# Patient Record
Sex: Male | Born: 1985
Health system: Southern US, Community
[De-identification: ages and names within clinical notes are randomized; demographics above are authoritative.]

## PROBLEM LIST (undated history)

## (undated) DIAGNOSIS — M79602 Pain in left arm: Secondary | ICD-10-CM

## (undated) DIAGNOSIS — R609 Edema, unspecified: Secondary | ICD-10-CM

## (undated) DIAGNOSIS — I10 Essential (primary) hypertension: Secondary | ICD-10-CM

## (undated) DIAGNOSIS — M79601 Pain in right arm: Secondary | ICD-10-CM

## (undated) DIAGNOSIS — M545 Low back pain, unspecified: Secondary | ICD-10-CM

## (undated) DIAGNOSIS — R202 Paresthesia of skin: Secondary | ICD-10-CM

## (undated) HISTORY — DX: Essential (primary) hypertension: I10

## (undated) HISTORY — PX: ROTATOR CUFF REPAIR: SHX139

## (undated) HISTORY — PX: FINGER SURGERY: SHX640

## (undated) HISTORY — DX: Edema, unspecified: R60.9

## (undated) HISTORY — DX: Paresthesia of skin: R20.2

## (undated) HISTORY — DX: Pain in left arm: M79.601

## (undated) HISTORY — DX: Pain in right arm: M79.601

## (undated) HISTORY — DX: Low back pain, unspecified: M54.50

## (undated) HISTORY — DX: Pain in left arm: M79.602

---
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
## 2005-05-22 ENCOUNTER — Emergency Department (HOSPITAL_COMMUNITY): Admission: EM | Admit: 2005-05-22 | Discharge: 2005-05-22 | Payer: Self-pay | Admitting: Emergency Medicine

## 2006-08-25 ENCOUNTER — Emergency Department (HOSPITAL_COMMUNITY): Admission: EM | Admit: 2006-08-25 | Discharge: 2006-08-25 | Payer: Self-pay | Admitting: Emergency Medicine

## 2006-08-25 IMAGING — CR DG SHOULDER 2+V*L*
3 series · 3 of 3 positions shown · non-contrast
Comparison: none

CLINICAL DATA: Dislocated shoulder after trauma; history of dislocations. 
 LEFT SHOULDER ? 3 VIEW:

[view not recorded (1 of 3)]
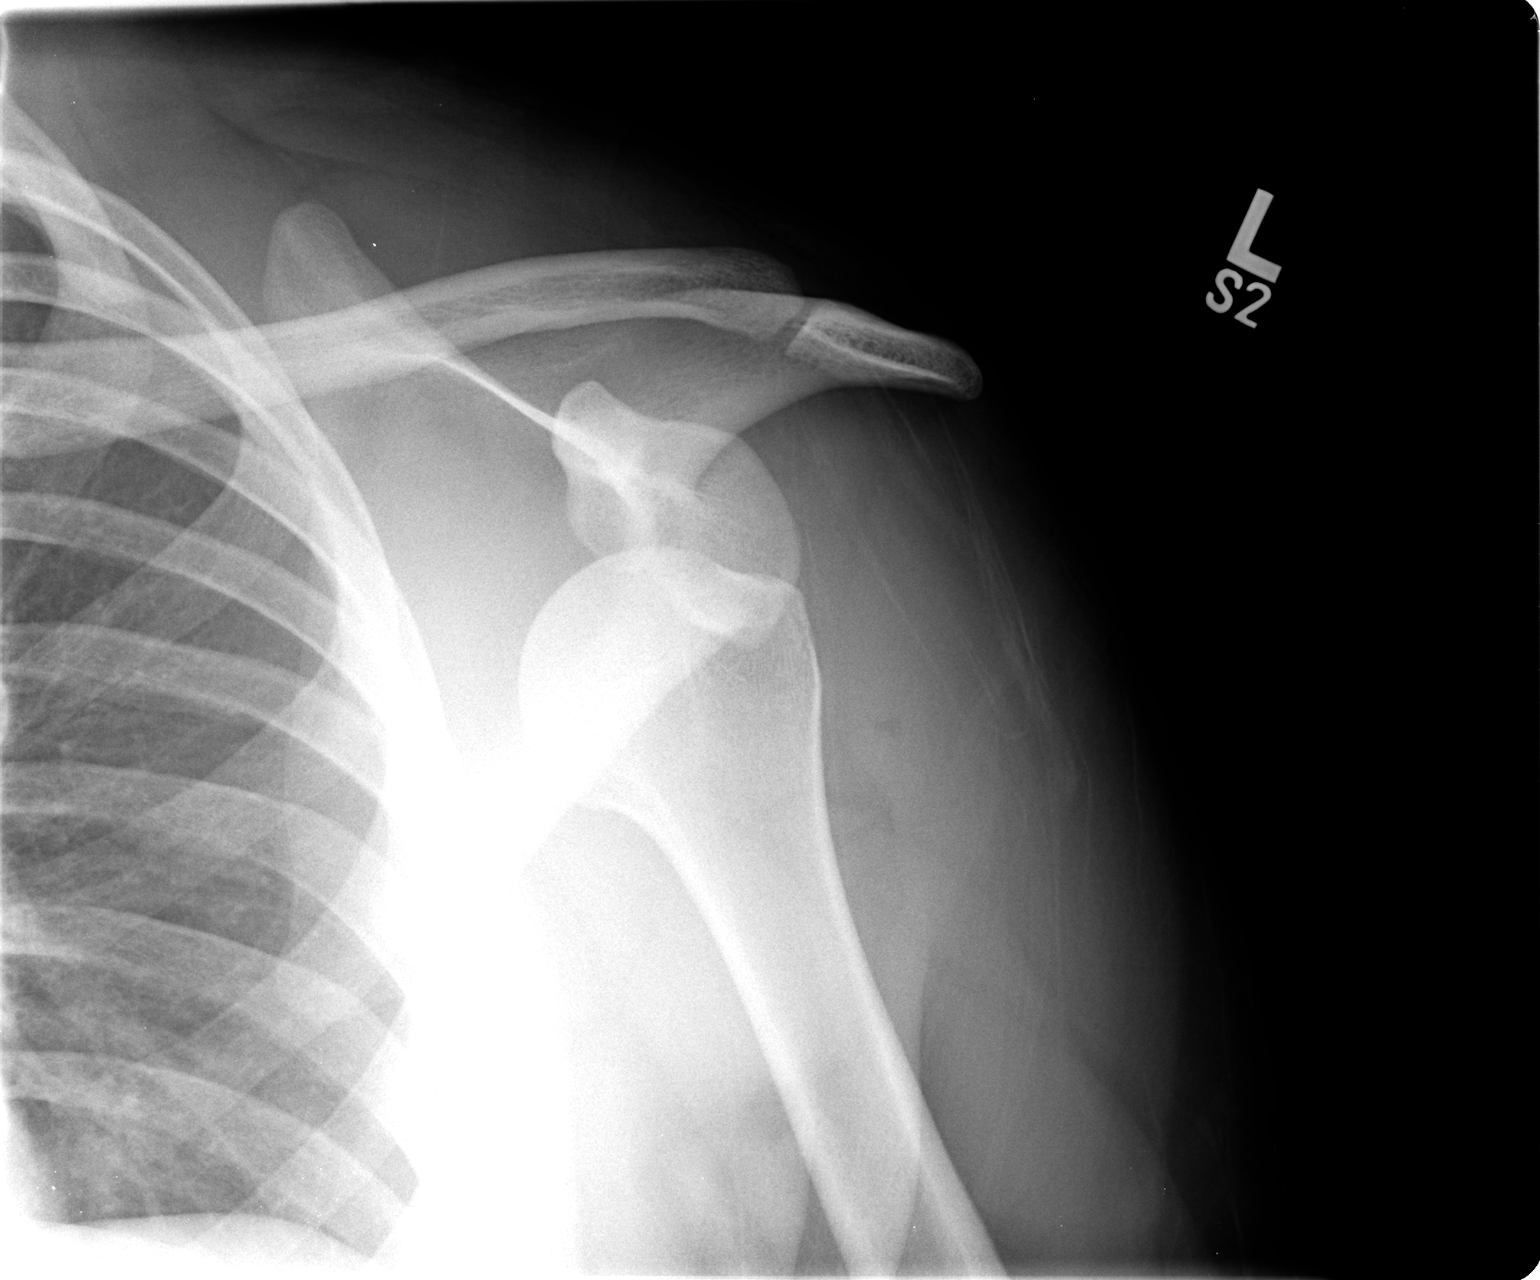

[view not recorded (2 of 3)]
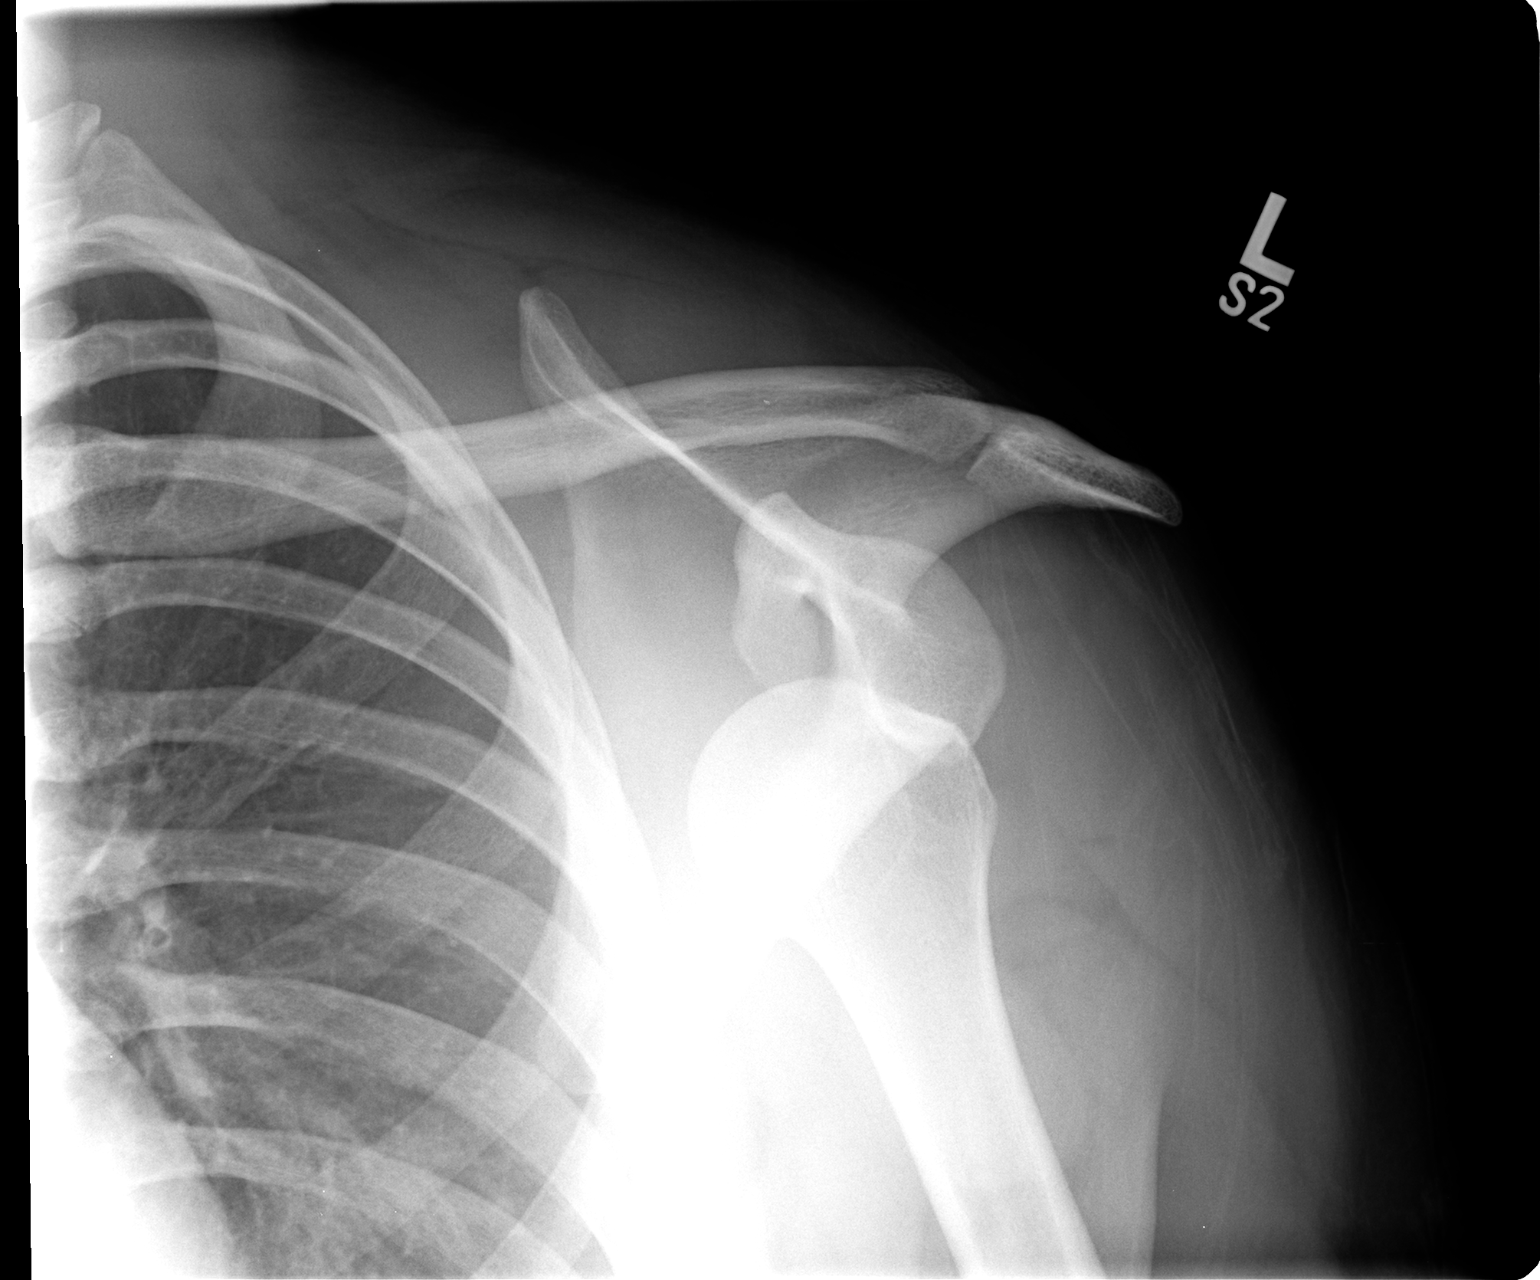

[view not recorded (3 of 3)]
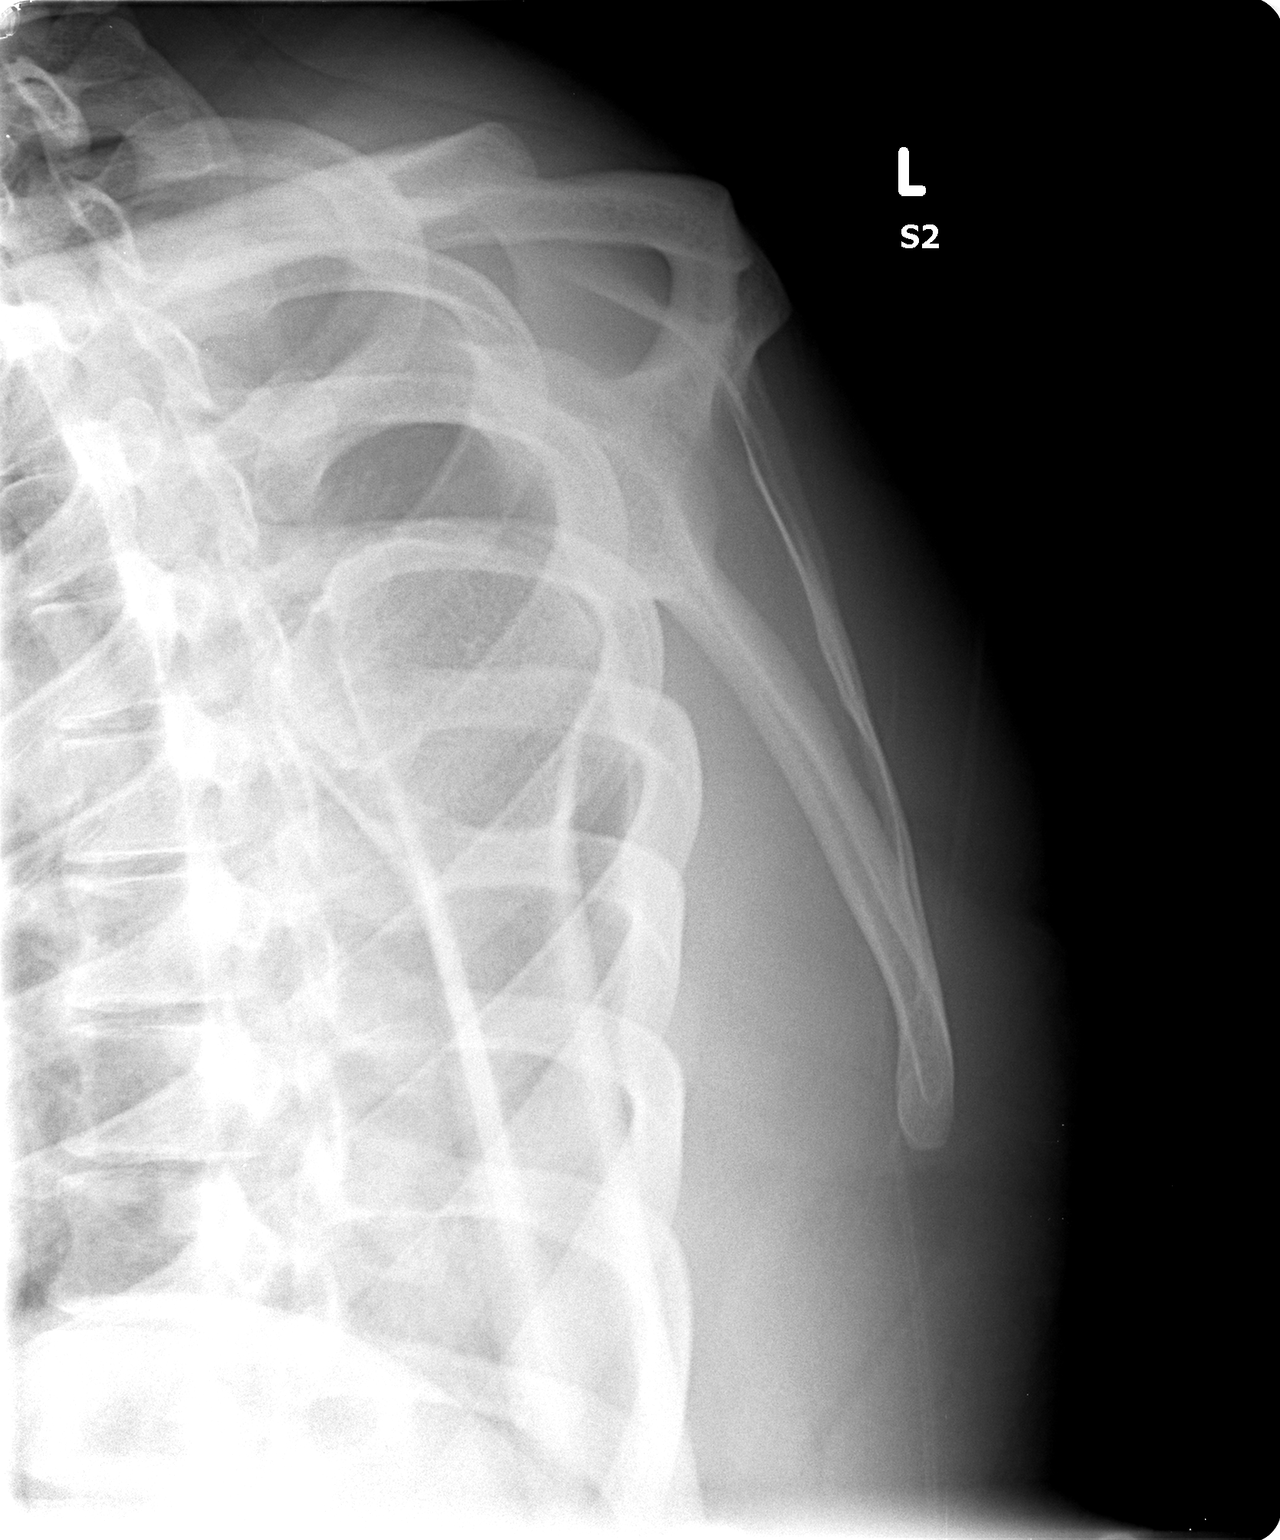

[3 of 3 positions shown; findings below may reference images not displayed]

FINDINGS: There is anterior dislocation of the left humeral head relative to the glenoid.  No fracture is seen.
IMPRESSION: Anterior left shoulder dislocation.

## 2006-08-25 IMAGING — CR DG SHOULDER 2+V*L*
3 series · 3 of 3 positions shown · non-contrast
Comparison: none

CLINICAL DATA: Status post reduction of dislocation.
 LEFT SHOULDER ? 3 VIEW:

[view not recorded (1 of 3)]
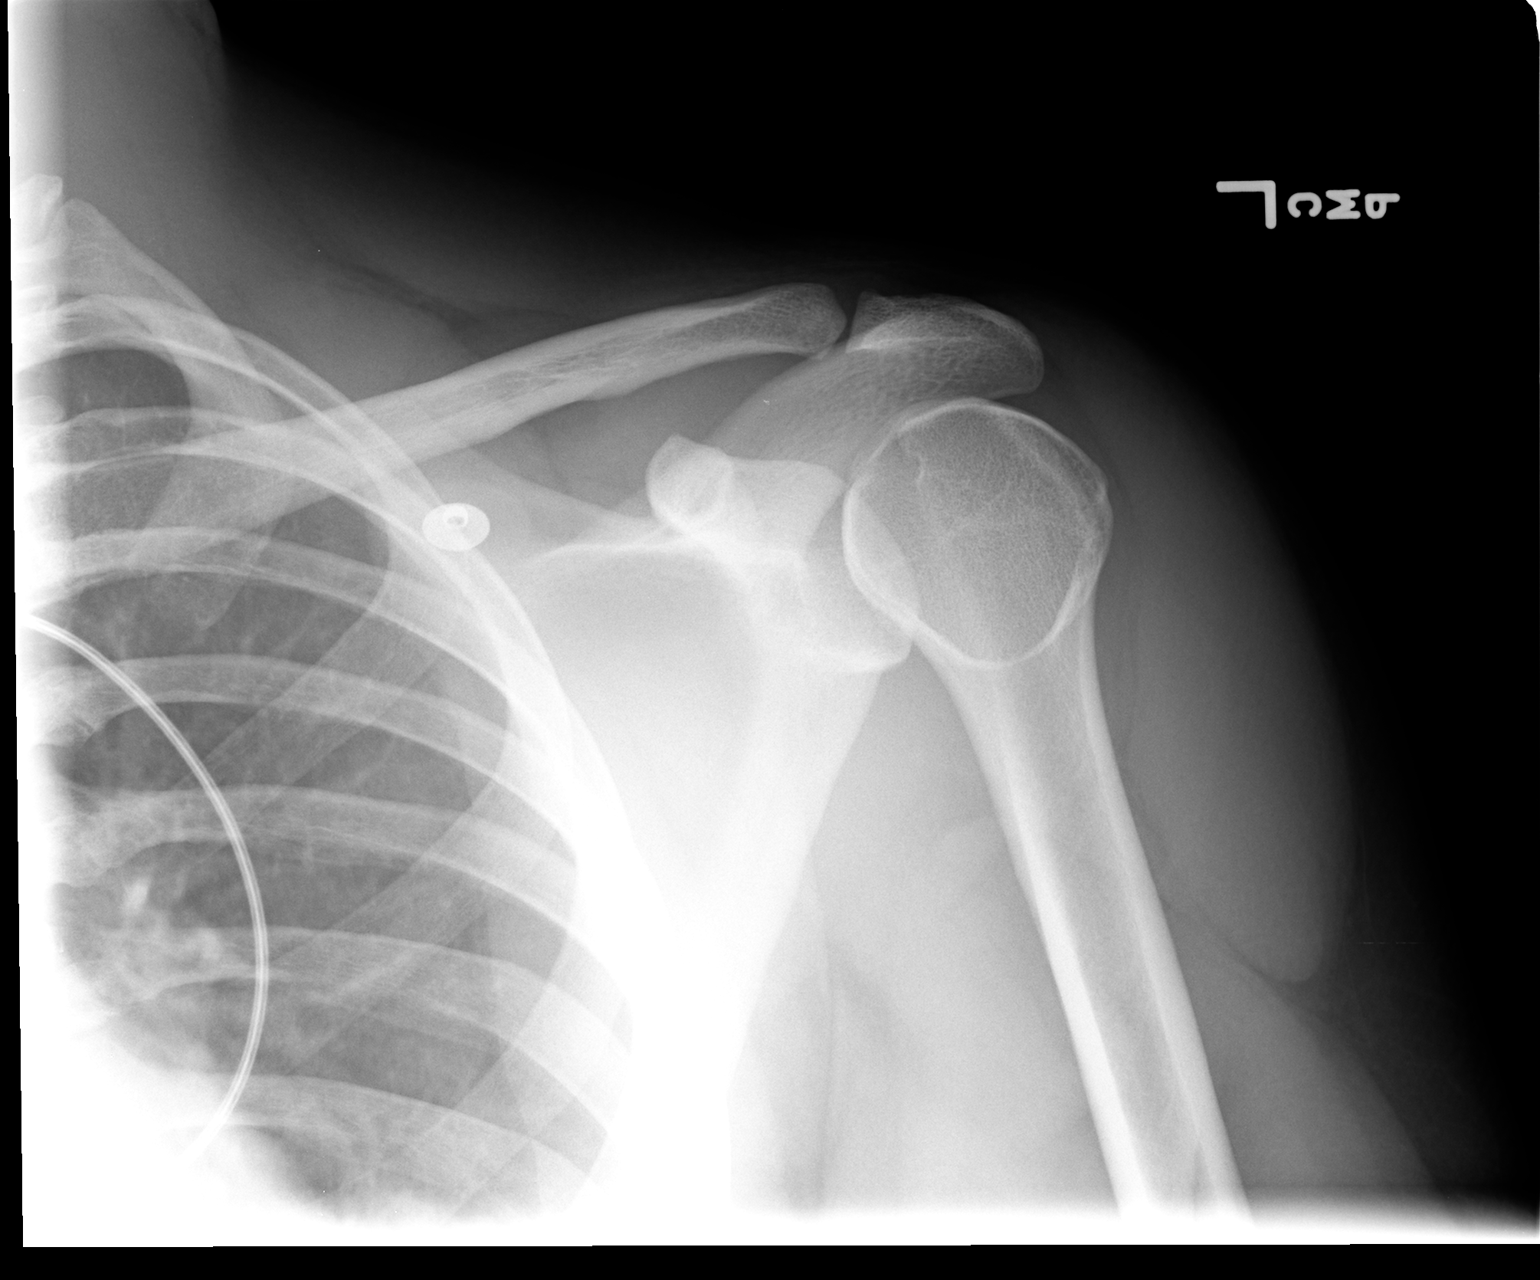

[view not recorded (2 of 3)]
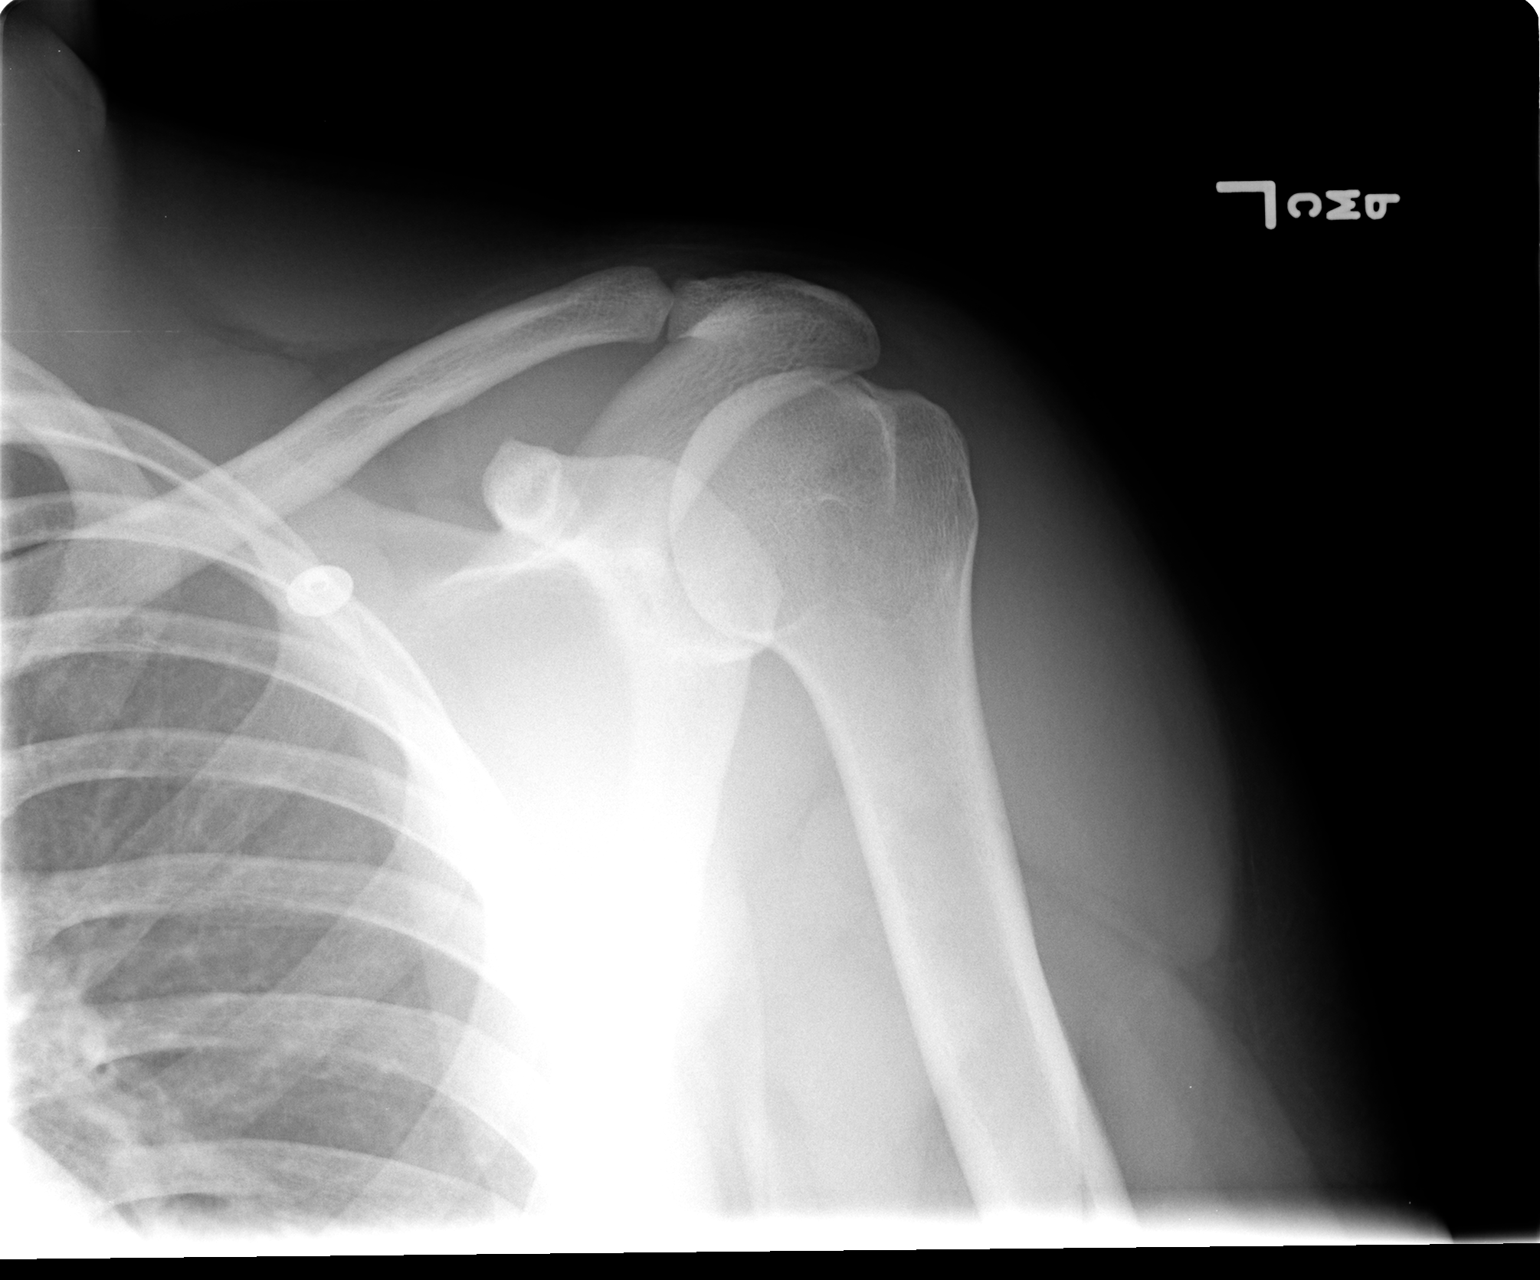

[view not recorded (3 of 3)]
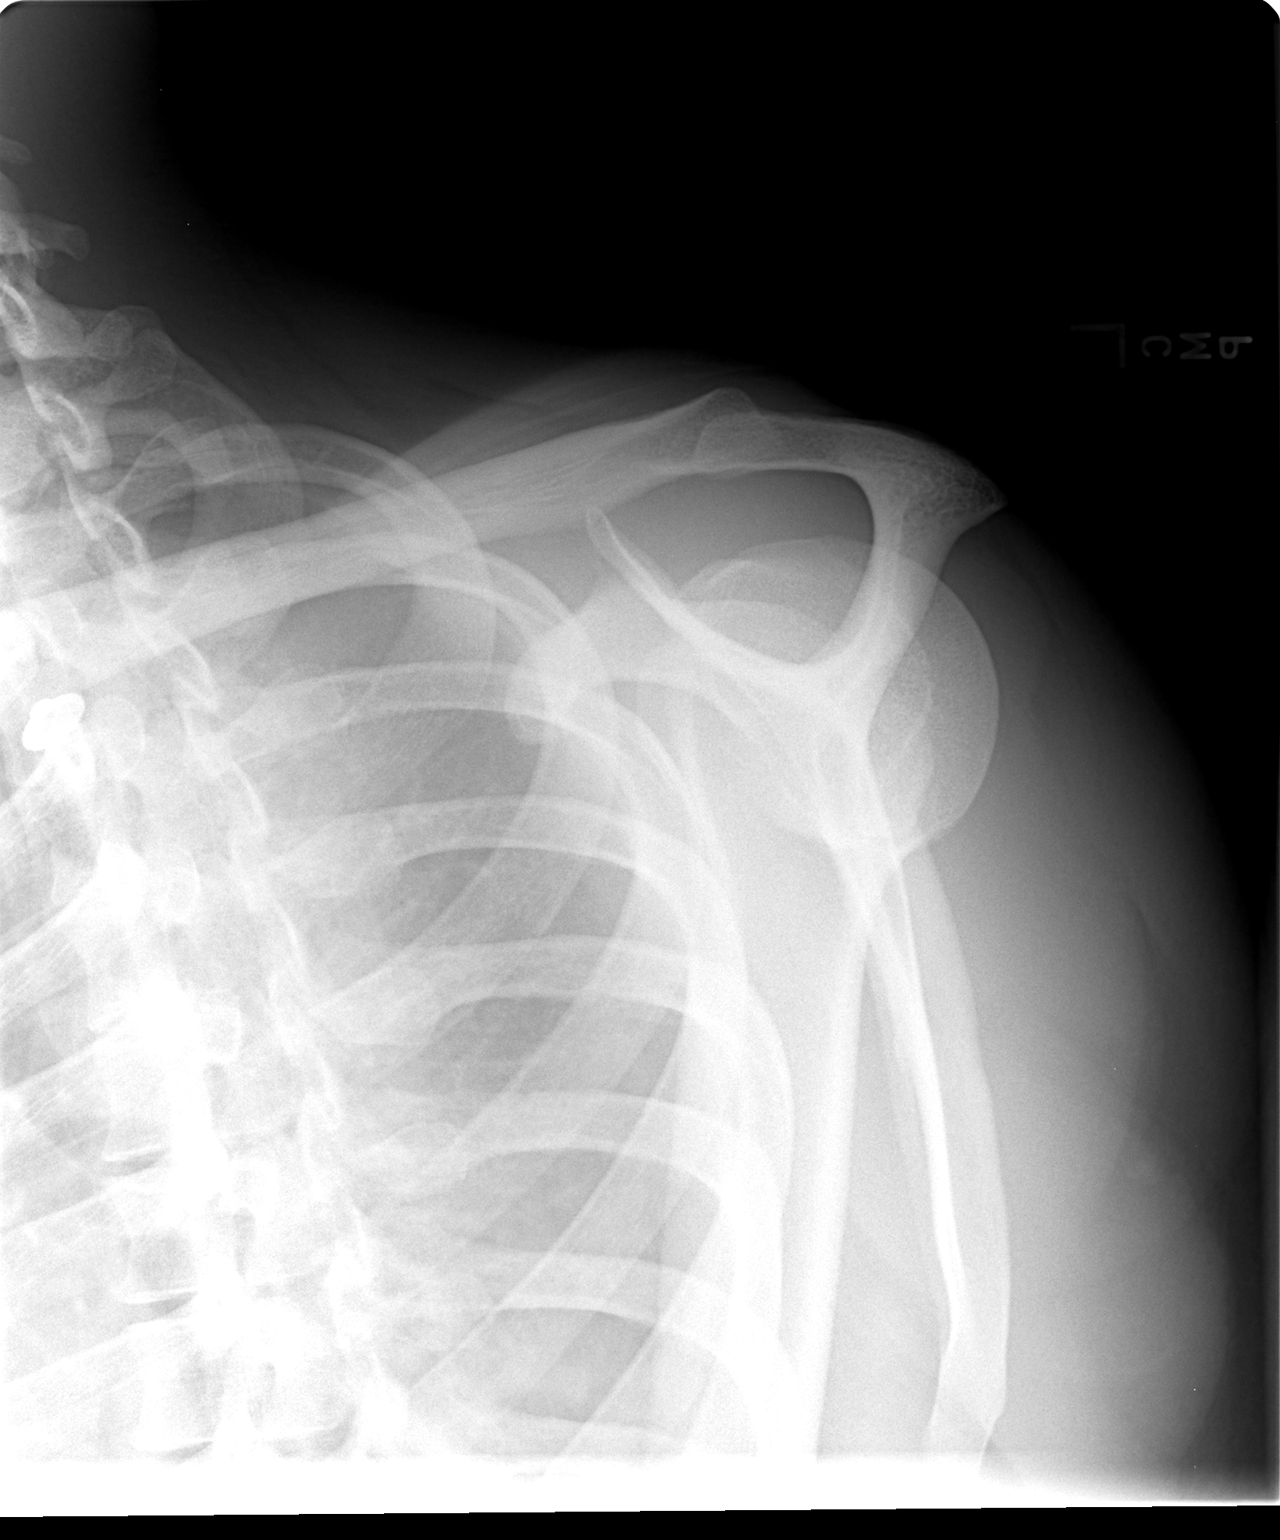

[3 of 3 positions shown; findings below may reference images not displayed]

FINDINGS: The patient has a Hill-Sachs deformity. The humerus is now located.  Acromioclavicular joint is intact.
IMPRESSION: Successful reduction of anterior dislocation with a Hill-Sachs deformity noted.

## 2007-02-01 ENCOUNTER — Emergency Department (HOSPITAL_COMMUNITY): Admission: EM | Admit: 2007-02-01 | Discharge: 2007-02-01 | Payer: Self-pay | Admitting: Emergency Medicine

## 2014-05-14 ENCOUNTER — Encounter (HOSPITAL_COMMUNITY): Payer: Self-pay | Admitting: Emergency Medicine

## 2014-05-14 ENCOUNTER — Emergency Department (INDEPENDENT_AMBULATORY_CARE_PROVIDER_SITE_OTHER)
Admission: EM | Admit: 2014-05-14 | Discharge: 2014-05-14 | Disposition: A | Discharge status: Home / Self Care | Payer: No Typology Code available for payment source | Source: Home / Self Care | Attending: Emergency Medicine | Admitting: Emergency Medicine

## 2014-05-14 DIAGNOSIS — M5432 Sciatica, left side: Secondary | ICD-10-CM

## 2014-05-14 DIAGNOSIS — M5430 Sciatica, unspecified side: Secondary | ICD-10-CM

## 2014-05-14 MED ORDER — PREDNISONE 20 MG PO TABS: ORAL_TABLET | ORAL | Status: DC

## 2014-05-14 NOTE — ED Notes (Signed)
C/o left leg numbness States he does have a bruise on left thigh due to hitting leg on pallet at work States leg tingles States pain is radiating to back

## 2014-05-14 NOTE — ED Provider Notes (Signed)
Chief Complaint   Chief Complaint  Patient presents with  . Leg Pain    History of Present Illness   Cody Rodriguez is a 28 year old male who was lifting some boxes at work this past Thursday which is 4 days ago. He struck his left anterior thigh against a box and sustained a small bruise to the anterior thigh. Ever since then he's had numbness and tingling which extend from his mid back on down his entire leg as far as the foot. He denies any pain. He also has numbness and tingling of the entire left half of the trunk. He denies any numbness or tingling in the arm but does note slight tingling in the tips of the fingers. There is no muscle weakness, although he does note that his leg feels like it's about to give way after prolonged standing. He's had no headache, diplopia, blurred vision, or stiff neck. He denies any chest pain or shortness of breath. He's had no upper or lower back pain. He denies any abdominal pain. He denies any weakness in his arm.  Review of Systems   Other than as noted above, the patient denies any of the following symptoms: Systemic:  No fever, chills, or unexplained weight loss. GI:  No abdominal painor incontinence of bowel. GU:  No dysuria, frequency, urgency, or hematuria. No incontinence of urine or urinary retention.  M-S:  No neck pain or arthritis. Neuro:  No paresthesias, headache, saddle anesthesia, muscular weakness, or progressive neurological deficit.  PMFSH   Past medical history, family history, social history, meds, and allergies were reviewed. Specifically, there is no history of cancer, major trauma, osteoporosis, immunosuppression, or HIV infection.   Physical Examination    Vital signs:  BP 151/105  Pulse 90  Temp(Src) 98.7 F (37.1 C) (Oral)  Resp 16  SpO2 100% General:  Alert, oriented, in no distress.  Eyes: PERRL, full EOMs.  Neck: Supple no adenopathy. Lungs: Clear to auscultation. Heart: Rhythm, no gallop or  murmur. Abdomen:  Soft, non-tender.  No organomegaly or mass.  No pulsatile midline abdominal mass or bruit. Back:  Nontender to palpation. Full range of motion without pain, straight leg raising negative. Neuro:  Normal muscle strength, sensations and DTRs. Cranial nerves were intact. He had no pronator drift. He had mild ataxia on finger to nose on the left side, none right. Extremities: Pedal pulses were full, there was no edema. He has a tiny bruise on his left anterior thigh. There was no pain to palpation. No calf tenderness and Homans sign was negative. Skin:  Clear, warm and dry.  No rash.  Assessment   The encounter diagnosis was Sciatica, left.  No evidence of cauda equina syndrome, discitis, epidural abscess, or aneurism.  I don't think his symptoms are due to the very small hematoma he has on his anterior thigh. I suspect mild sciatica. There is no evidence of a stroke. No evidence of any other neurological syndrome.  Plan     1.  Meds:  The following meds were prescribed:   Discharge Medication List as of 05/14/2014  2:05 PM    START taking these medications   Details  predniSONE (DELTASONE) 20 MG tablet Take 3 daily for 5 days, 2 daily for 5 days, 1 daily for 5 days., Normal        2.  Patient Education/Counseling:  The patient was given appropriate handouts, self care instructions, and instructed in symptomatic relief. The patient was encouraged to try to  be as active as possible and given some exercises to do followed by moist heat. Given the exercises for the back.  3.  Follow up:  The patient was told to follow up here if no better in 3 to 4 days, or sooner if becoming worse in any way, and given some red flag symptoms such as worsening pain or new neurological symptoms which would prompt immediate return.  Follow up followup with Dr. Shon MilletAdam Jaffe within the next one to 2 weeks, given strict precautions to return if he should become worse in any way, particularly with new or  changing neurological symptoms.     Reuben Likesavid C Alannie Amodio, MD 05/14/14 [phone]

## 2014-05-14 NOTE — Discharge Instructions (Signed)
Do exercises twice daily followed by moist heat for 15 minutes. ° ° ° ° ° °Try to be as active as possible. ° °If no better in 2 weeks, follow up with orthopedist. ° ° °

## 2014-06-21 ENCOUNTER — Emergency Department (HOSPITAL_BASED_OUTPATIENT_CLINIC_OR_DEPARTMENT_OTHER)
Admission: EM | Admit: 2014-06-21 | Discharge: 2014-06-21 | Disposition: A | Discharge status: Home / Self Care | Payer: No Typology Code available for payment source | Attending: Emergency Medicine | Admitting: Emergency Medicine

## 2014-06-21 ENCOUNTER — Encounter (HOSPITAL_BASED_OUTPATIENT_CLINIC_OR_DEPARTMENT_OTHER): Payer: Self-pay | Admitting: Emergency Medicine

## 2014-06-21 DIAGNOSIS — F17200 Nicotine dependence, unspecified, uncomplicated: Secondary | ICD-10-CM | POA: Insufficient documentation

## 2014-06-21 DIAGNOSIS — R209 Unspecified disturbances of skin sensation: Secondary | ICD-10-CM | POA: Diagnosis present

## 2014-06-21 DIAGNOSIS — R202 Paresthesia of skin: Secondary | ICD-10-CM

## 2014-06-21 LAB — BASIC METABOLIC PANEL
Anion gap: 13 (ref 5–15)
BUN: 16 mg/dL (ref 6–23)
CO2: 26 mEq/L (ref 19–32)
Calcium: 9.5 mg/dL (ref 8.4–10.5)
Chloride: 103 mEq/L (ref 96–112)
Creatinine, Ser: 1.2 mg/dL (ref 0.50–1.35)
GFR calc Af Amer: >90 mL/min (ref >90)
GFR calc non Af Amer: 81 mL/min — ABNORMAL LOW (ref >90)
Glucose, Bld: 85 mg/dL (ref 70–99)
Potassium: 3.4 mEq/L — ABNORMAL LOW (ref 3.7–5.3)
Sodium: 142 mEq/L (ref 137–147)

## 2014-06-21 LAB — CBC WITH DIFFERENTIAL/PLATELET
Basophils Absolute: 0 10*3/uL (ref 0.0–0.1)
Basophils Relative: 0 % (ref 0–1)
Eosinophils Absolute: 0.1 10*3/uL (ref 0.0–0.7)
Eosinophils Relative: 1 % (ref 0–5)
HCT: 45.6 % (ref 39.0–52.0)
Hemoglobin: 15.8 g/dL (ref 13.0–17.0)
Lymphocytes Relative: 33 % (ref 12–46)
Lymphs Abs: 4.2 10*3/uL — ABNORMAL HIGH (ref 0.7–4.0)
MCH: 30.1 pg (ref 26.0–34.0)
MCHC: 34.6 g/dL (ref 30.0–36.0)
MCV: 86.9 fL (ref 78.0–100.0)
Monocytes Absolute: 1.6 10*3/uL — ABNORMAL HIGH (ref 0.1–1.0)
Monocytes Relative: 13 % — ABNORMAL HIGH (ref 3–12)
Neutro Abs: 6.8 10*3/uL (ref 1.7–7.7)
Neutrophils Relative %: 53 % (ref 43–77)
Platelets: 336 10*3/uL (ref 150–400)
RBC: 5.25 MIL/uL (ref 4.22–5.81)
RDW: 15.5 % (ref 11.5–15.5)
WBC: 12.7 10*3/uL — ABNORMAL HIGH (ref 4.0–10.5)

## 2014-06-21 MED ORDER — PREDNISONE 10 MG PO TABS: 20.0000 mg | ORAL_TABLET | Freq: Two times a day (BID) | ORAL | Status: DC

## 2014-06-21 NOTE — ED Notes (Signed)
Tingling in bilateral hands and legs that started 4 days ago.  Has been seen on 05/14/14 for similar symptoms at [facility]gent [facility].  Took Prednisone with relief of symptoms.

## 2014-06-21 NOTE — ED Notes (Signed)
MD at bedside. 

## 2014-06-21 NOTE — Discharge Instructions (Signed)
Prednisone as prescribed.   Return to the ER if your symptoms substantially worsen or change.   Paresthesia Paresthesia is an abnormal burning or prickling sensation. This sensation is generally felt in the hands, arms, legs, or feet. However, it may occur in any part of the body. It is usually not painful. The feeling may be described as:  Tingling or numbness.  "Pins and needles."  Skin crawling.  Buzzing.  Limbs "falling asleep."  Itching. Most people experience temporary (transient) paresthesia at some time in their lives. CAUSES  Paresthesia may occur when you breathe too quickly (hyperventilation). It can also occur without any apparent cause. Commonly, paresthesia occurs when pressure is placed on a nerve. The feeling quickly goes away once the pressure is removed. For some people, however, paresthesia is a long-lasting (chronic) condition caused by an underlying disorder. The underlying disorder may be:  A traumatic, direct injury to nerves. Examples include a:  Broken (fractured) neck.  Fractured skull.  A disorder affecting the brain and spinal cord (central nervous system). Examples include:  Transverse myelitis.  Encephalitis.  Transient ischemic attack.  Multiple sclerosis.  Stroke.  Tumor or blood vessel problems, such as an arteriovenous malformation pressing against the brain or spinal cord.  A condition that damages the peripheral nerves (peripheral neuropathy). Peripheral nerves are not part of the brain and spinal cord. These conditions include:  Diabetes.  Peripheral vascular disease.  Nerve entrapment syndromes, such as carpal tunnel syndrome.  Shingles.  Hypothyroidism.  Vitamin B12 deficiencies.  Alcoholism.  Heavy metal poisoning (lead, arsenic).  Rheumatoid arthritis.  Systemic lupus erythematosus. DIAGNOSIS  Your caregiver will attempt to find the underlying cause of your paresthesia. Your caregiver may:  Take your medical  history.  Perform a physical exam.  Order various lab tests.  Order imaging tests. TREATMENT  Treatment for paresthesia depends on the underlying cause. HOME CARE INSTRUCTIONS  Avoid drinking alcohol.  You may consider massage or acupuncture to help relieve your symptoms.  Keep all follow-up appointments as directed by your caregiver. SEEK IMMEDIATE MEDICAL CARE IF:   You feel weak.  You have trouble walking or moving.  You have problems with speech or vision.  You feel confused.  You cannot control your bladder or bowel movements.  You feel numbness after an injury.  You faint.  Your burning or prickling feeling gets worse when walking.  You have pain, cramps, or dizziness.  You develop a rash. MAKE SURE YOU:  Understand these instructions.  Will watch your condition.  Will get help right away if you are not doing well or get worse. Document Released: 09/12/2002 Document Revised: 12/15/2011 Document Reviewed: 06/13/2011 [facility] Patient Information 2015 [place], [place]. This information is not intended to replace advice given to you by your health care provider. Make sure you discuss any questions you have with your health care provider.

## 2014-06-21 NOTE — ED Provider Notes (Signed)
CSN: [id number]     Arrival date & time 06/21/14  3383 History   First MD Initiated Contact with Patient 06/21/14 [phone]     Chief Complaint  Patient presents with  . Tingling     (Consider location/radiation/quality/duration/timing/severity/associated sxs/prior Treatment) HPI Comments: Patient is a 28 year old male with no significant past medical history. He presents today with complaints of numbness and tingling in his arms, legs, and chest that has been ongoing for the past 4 days. He denies any injury or trauma. He denies any headache or neck pain. He has no aggravating or alleviating factors. He had a similar episode that involved his left arm and leg last month. He was treated with prednisone and his symptoms resolved. They have now returned over the past 3 days. He denies any weakness. He denies any bowel or bladder complaints. He denies any excessive thirst or urination.  The history is provided by the patient.    History reviewed. No pertinent past medical history. Past Surgical History  Procedure Laterality Date  . Rotator cuff repair     No family history on file. History  Substance Use Topics  . Smoking status: Current Every Day Smoker -- 0.50 packs/day    Types: Cigarettes  . Smokeless tobacco: Not on file  . Alcohol Use: Yes     Comment: occasional    Review of Systems  All other systems reviewed and are negative.     Allergies  Review of patient's allergies indicates no known allergies.  Home Medications   Prior to Admission medications   Medication Sig Start Date End Date Taking? Authorizing Provider  predniSONE (DELTASONE) 20 MG tablet Take 3 daily for 5 days, 2 daily for 5 days, 1 daily for 5 days. 05/14/14   Reuben Likes, MD   BP 154/89  Pulse 91  Temp(Src) 98 F (36.7 C) (Oral)  Resp 16  Ht 5\' 9"  (1.753 m)  Wt 220 lb (99.791 kg)  BMI 32.47 kg/m2  SpO2 100% Physical Exam  Nursing note and vitals reviewed. Constitutional: He is oriented to  person, place, and time. He appears well-developed and well-nourished. No distress.  HENT:  Head: Normocephalic and atraumatic.  Mouth/Throat: Oropharynx is clear and moist.  Eyes: EOM are normal. Pupils are equal, round, and reactive to light.  Neck: Normal range of motion. Neck supple.  Cardiovascular: Normal rate, regular rhythm and normal heart sounds.   No murmur heard. Pulmonary/Chest: Effort normal and breath sounds normal. No respiratory distress. He has no wheezes.  Abdominal: Soft. Bowel sounds are normal. He exhibits no distension. There is no tenderness.  Musculoskeletal: Normal range of motion. He exhibits no edema.  Lymphadenopathy:    He has no cervical adenopathy.  Neurological: He is alert and oriented to person, place, and time. No cranial nerve deficit. He exhibits normal muscle tone. Coordination normal.  DTRs are 1+ and symmetrical in the patellar and Achilles tendons, as well as the biceps tendon. Strength is 5 out of 5 in the bilateral upper and lower extremities. He is able walk on his heels and toes without difficulty. Sensation is intact throughout.  Skin: Skin is warm and dry. He is not diaphoretic.    ED Course  Procedures (including critical care time) Labs Review Labs Reviewed  CBC WITH DIFFERENTIAL  BASIC METABOLIC PANEL    Imaging Review No results found.   EKG Interpretation None      MDM   Final diagnoses:  None    Patient presents  here with complaints of numbness in all extremities and torso. His neurologic exam is nonfocal with symmetrical reflexes and completely intact strength. He has no bowel or bladder complaints. He has no neck pain. Electrolytes reveal no acute abnormalities that would explain his symptoms. I am uncertain as to the exact cause of his symptoms, however nothing appears emergent. I doubt an acute neurologic problem such as cervical radiculopathy. Improved with prednisone in the past. I will repeat this course of  prednisone. If he is not improving he is to be followed up either by his primary Dr. or in the emergency department. I see no indication for emergent MRI.    Geoffery Lyons, MD 06/21/14 [phone]

## 2014-06-26 ENCOUNTER — Ambulatory Visit (INDEPENDENT_AMBULATORY_CARE_PROVIDER_SITE_OTHER): Payer: No Typology Code available for payment source | Admitting: Family Medicine

## 2014-06-26 VITALS — BP 138/88 | HR 82 | Temp 98.0°F | Resp 18 | Ht 71.0 in | Wt 230.6 lb

## 2014-06-26 DIAGNOSIS — R5381 Other malaise: Secondary | ICD-10-CM

## 2014-06-26 DIAGNOSIS — R202 Paresthesia of skin: Secondary | ICD-10-CM

## 2014-06-26 DIAGNOSIS — M25549 Pain in joints of unspecified hand: Secondary | ICD-10-CM

## 2014-06-26 DIAGNOSIS — R5383 Other fatigue: Secondary | ICD-10-CM

## 2014-06-26 DIAGNOSIS — Z113 Encounter for screening for infections with a predominantly sexual mode of transmission: Secondary | ICD-10-CM

## 2014-06-26 DIAGNOSIS — M79643 Pain in unspecified hand: Secondary | ICD-10-CM

## 2014-06-26 DIAGNOSIS — R209 Unspecified disturbances of skin sensation: Secondary | ICD-10-CM

## 2014-06-26 DIAGNOSIS — R531 Weakness: Secondary | ICD-10-CM

## 2014-06-26 LAB — POCT CBC
Granulocyte percent: 60.2 %G (ref 37–80)
HCT, POC: 49.3 % (ref 43.5–53.7)
Hemoglobin: 15.7 g/dL (ref 14.1–18.1)
Lymph, poc: 3.2 (ref 0.6–3.4)
MCH, POC: 29.5 pg (ref 27–31.2)
MCHC: 31.9 g/dL (ref 31.8–35.4)
MCV: 92.4 fL (ref 80–97)
MID (cbc): 0.4 (ref 0–0.9)
MPV: 7.6 fL (ref 0–99.8)
POC Granulocyte: 5.5 (ref 2–6.9)
POC LYMPH PERCENT: 35.4 %L (ref 10–50)
POC MID %: 4.4 %M (ref 0–12)
Platelet Count, POC: 277 10*3/uL (ref 142–424)
RBC: 5.34 M/uL (ref 4.69–6.13)
RDW, POC: 17.1 %
WBC: 9.1 10*3/uL (ref 4.6–10.2)

## 2014-06-26 LAB — COMPLETE METABOLIC PANEL WITH GFR
ALT: 38 U/L (ref 0–53)
AST: 22 U/L (ref 0–37)
Albumin: 4.4 g/dL (ref 3.5–5.2)
Alkaline Phosphatase: 75 U/L (ref 39–117)
BUN: 14 mg/dL (ref 6–23)
CO2: 26 mEq/L (ref 19–32)
Calcium: 9.7 mg/dL (ref 8.4–10.5)
Chloride: 104 mEq/L (ref 96–112)
Creat: 1.16 mg/dL (ref 0.50–1.35)
GFR, Est African American: >89 mL/min
GFR, Est Non African American: 85 mL/min
Glucose, Bld: 80 mg/dL (ref 70–99)
Potassium: 3.9 mEq/L (ref 3.5–5.3)
Sodium: 140 mEq/L (ref 135–145)
Total Bilirubin: 0.3 mg/dL (ref 0.2–1.2)
Total Protein: 7.2 g/dL (ref 6.0–8.3)

## 2014-06-26 LAB — VITAMIN B12: Vitamin B-12: 756 pg/mL (ref 211–911)

## 2014-06-26 LAB — TSH: TSH: 1.772 u[IU]/mL (ref 0.350–4.500)

## 2014-06-26 LAB — POCT SEDIMENTATION RATE: POCT SED RATE: 9 mm/hr (ref 0–22)

## 2014-06-26 LAB — FOLATE: Folate: 11.9 ng/mL

## 2014-06-26 LAB — POCT GLYCOSYLATED HEMOGLOBIN (HGB A1C): Hemoglobin A1C: 5.1

## 2014-06-26 MED ORDER — MELOXICAM 7.5 MG PO TABS: 7.5000 mg | ORAL_TABLET | Freq: Two times a day (BID) | ORAL | Status: DC | PRN

## 2014-06-26 NOTE — Progress Notes (Signed)
Chief Complaint:  Chief Complaint  Patient presents with  . Numbness    Says whole body feels numb, hands, arms, back, etc, been going on since friday    HPI: Cody Rodriguez is a 28 y.o. male who is here for pain , swelling numbness and tingling in both hands, both feet and trunk/ chest and  he has felt weak for last the 4 days. It really started last month on left side  Of his leg after he hit his leg against a box but 4 days ago sxs manifested on both sides. Denies any known triggers, he has no URI sxs, no rashes, no recent travels, , unintentional  weight loss, fevers, chils, cough , sore throat joint pain. No h/o STDs ie HIV,  GC RPR, no sore throat. Deneis any family hx of RA but not sure,  There is DM in family with GM. He was seen in the ER for this several time 9/16 and 8/9 and was given prednisone and felt better but it made him gain weight and feel bloated so he stopped, last ER visit was 9/16 ( please see HPI below from that OV) only abnormal labs was that he had hypokalemia at 3.4.  No AMS, the numbness and tingling is diffuse;  he was workign with [employer] but had to stop because of this . The n/t is not affected by cold weather.  He denies any recent flu vaccine  OV HPI 06/21/14 Patient is a 28 year old male with no significant past medical history. He presents today with complaints of numbness and tingling in his arms, legs, and chest that has been ongoing for the past 4 days. He denies any injury or trauma. He denies any headache or neck pain. He has no aggravating or alleviating factors. He had a similar episode that involved his left arm and leg last month. He was treated with prednisone and his symptoms resolved. They have now returned over the past 3 days. He denies any weakness. He denies any bowel or bladder complaints. He denies any excessive thirst or urination.  OV HPI 05/14/14 Cody Rodriguez is a 28 year old male who was lifting some boxes at work this past  Thursday which is 4 days ago. He struck his left anterior thigh against a box and sustained a small bruise to the anterior thigh. Ever since then he's had numbness and tingling which extend from his mid back on down his entire leg as far as the foot. He denies any pain. He also has numbness and tingling of the entire left half of the trunk. He denies any numbness or tingling in the arm but does note slight tingling in the tips of the fingers. There is no muscle weakness, although he does note that his leg feels like it's about to give way after prolonged standing. He's had no headache, diplopia, blurred vision, or stiff neck. He denies any chest pain or shortness of breath. He's had no upper or lower back pain. He denies any abdominal pain. He denies any weakness in his arm.   History reviewed. No pertinent past medical history. Past Surgical History  Procedure Laterality Date  . Rotator cuff repair     History   Social History  . Marital Status: Single    Spouse Name: N/A    Number of Children: N/A  . Years of Education: N/A   Social History Main Topics  . Smoking status: Current Every Day Smoker -- 0.50 packs/day  Types: Cigarettes  . Smokeless tobacco: None  . Alcohol Use: Yes     Comment: occasional  . Drug Use: No  . Sexual Activity: None   Other Topics Concern  . None   Social History Narrative  . None   History reviewed. No pertinent family history. No Known Allergies Prior to Admission medications   Not on File     ROS: The patient denies fevers, chills, night sweats, unintentional weight loss, chest pain, palpitations, wheezing, dyspnea on exertion, nausea, vomiting, abdominal pain, dysuria, hematuria, melena, + numbness, weakness, or tingling.   All other systems have been reviewed and were otherwise negative with the exception of those mentioned in the HPI and as above.    PHYSICAL EXAM: Filed Vitals:   06/26/14 1248  BP: 138/88  Pulse: 82  Temp: 98 F  (36.7 C)  Resp: 18   Filed Vitals:   06/26/14 1248  Height: 5\' 11"  (1.803 m)  Weight: 230 lb 9.6 oz (104.599 kg)   Body mass index is 32.18 kg/(m^2).  General: Alert, no acute distress HEENT:  Normocephalic, atraumatic, oropharynx patent. EOMI, PERRLA, fundoscopic exam normal, tm normal Cardiovascular:  Regular rate and rhythm, no rubs murmurs or gallops.  No Carotid bruits, radial pulse intact. No pedal edema.  Respiratory: Clear to auscultation bilaterally.  No wheezes, rales, or rhonchi.  No cyanosis, no use of accessory musculature GI: No organomegaly, abdomen is soft and non-tender, positive bowel sounds.  No masses. Skin: No rashes. Neurologic: Facial musculature symmetric. Psychiatric: Patient is appropriate throughout our interaction. Lymphatic: No cervical lymphadenopathy Musculoskeletal: Gait intact. 5/5 strength, 2/2 DTRs Sensation intact Full rom of neck, back, Kaysan Peixoto and UE   LABS: Results for orders placed in visit on 06/26/14  GC/CHLAMYDIA PROBE AMP      Result Value Ref Range   CT Probe RNA NEGATIVE     GC Probe RNA NEGATIVE    RPR      Result Value Ref Range   RPR NON REAC  NON REAC  HIV ANTIBODY (ROUTINE TESTING)      Result Value Ref Range   HIV 1&2 Ab, 4th Generation NONREACTIVE  NONREACTIVE  COMPLETE METABOLIC PANEL WITH GFR      Result Value Ref Range   Sodium 140  135 - 145 mEq/L   Potassium 3.9  3.5 - 5.3 mEq/L   Chloride 104  96 - 112 mEq/L   CO2 26  19 - 32 mEq/L   Glucose, Bld 80  70 - 99 mg/dL   BUN 14  6 - 23 mg/dL   Creat 5.46  5.68 - 1.27 mg/dL   Total Bilirubin 0.3  0.2 - 1.2 mg/dL   Alkaline Phosphatase 75  39 - 117 U/L   AST 22  0 - 37 U/L   ALT 38  0 - 53 U/L   Total Protein 7.2  6.0 - 8.3 g/dL   Albumin 4.4  3.5 - 5.2 g/dL   Calcium 9.7  8.4 - 51.7 mg/dL   GFR, Est African American >89     GFR, Est Non African American 85    FOLATE      Result Value Ref Range   Folate 11.9    VITAMIN B12      Result Value Ref Range   Vitamin  B-12 756  211 - 911 pg/mL  ANA      Result Value Ref Range   ANA NEG  NEGATIVE  TSH      Result  Value Ref Range   TSH 1.772  0.350 - 4.500 uIU/mL  POCT CBC      Result Value Ref Range   WBC 9.1  4.6 - 10.2 K/uL   Lymph, poc 3.2  0.6 - 3.4   POC LYMPH PERCENT 35.4  10 - 50 %L   MID (cbc) 0.4  0 - 0.9   POC MID % 4.4  0 - 12 %M   POC Granulocyte 5.5  2 - 6.9   Granulocyte percent 60.2  37 - 80 %G   RBC 5.34  4.69 - 6.13 M/uL   Hemoglobin 15.7  14.1 - 18.1 g/dL   HCT, POC 62.1  30.8 - 53.7 %   MCV 92.4  80 - 97 fL   MCH, POC 29.5  27 - 31.2 pg   MCHC 31.9  31.8 - 35.4 g/dL   RDW, POC 65.7     Platelet Count, POC 277  142 - 424 K/uL   MPV 7.6  0 - 99.8 fL  POCT GLYCOSYLATED HEMOGLOBIN (HGB A1C)      Result Value Ref Range   Hemoglobin A1C 5.1    POCT SEDIMENTATION RATE      Result Value Ref Range   POCT SED RATE 9  0 - 22 mm/hr     EKG/XRAY:   Primary read interpreted by Dr. Conley Rolls at [facility].   ASSESSMENT/PLAN: Encounter Diagnoses  Name Primary?  . Hand joint pain, unspecified laterality Yes  . Paresthesia of both hands   . Paresthesia of both feet   . Weakness   . Screening for STD (sexually transmitted disease)    Pleasant 28 y/o male who is here for diffuse body weakness, numbness and tingling which has stopped him from working  He does not want to gain weight so does not want to take prednisone, has stopped taking it  He will get mobic 7.5 mg dialy to bid prn  Labs pending, if abnormal will refer either to rheumatology or neurology if needed [phone] F/u by phone  Gross sideeffects, risk and benefits, and alternatives of medications d/w patient. Patient is aware that all medications have potential sideeffects and we are unable to predict every sideeffect or drug-drug interaction that may occur.  Arizbeth Cawthorn PHUONG, DO 06/28/2014 7:16 AM

## 2014-06-26 NOTE — Patient Instructions (Signed)

## 2014-06-27 LAB — ANA: Anti Nuclear Antibody(ANA): NEGATIVE

## 2014-06-27 LAB — GC/CHLAMYDIA PROBE AMP
CT Probe RNA: NEGATIVE
GC Probe RNA: NEGATIVE

## 2014-06-27 LAB — HIV ANTIBODY (ROUTINE TESTING W REFLEX): HIV 1&2 Ab, 4th Generation: NONREACTIVE

## 2014-06-27 LAB — RPR

## 2014-06-28 ENCOUNTER — Telehealth: Payer: Self-pay

## 2014-06-28 DIAGNOSIS — R200 Anesthesia of skin: Secondary | ICD-10-CM

## 2014-06-28 DIAGNOSIS — R202 Paresthesia of skin: Secondary | ICD-10-CM

## 2014-06-28 NOTE — Telephone Encounter (Signed)
Can we refer? 

## 2014-06-28 NOTE — Telephone Encounter (Signed)
Pt is needing to talk with someone regarding a referral to [facility] neurosurgery dr Lovell Sheehan

## 2014-06-29 ENCOUNTER — Encounter: Payer: Self-pay | Admitting: Family Medicine

## 2014-06-29 NOTE — Telephone Encounter (Signed)
Unable to leave message, sounds like a fax line. He really does not need neurosurgery. He needs a neurology referral. Letter has been sent out to him that all labs that we did were normal.

## 2014-06-30 ENCOUNTER — Ambulatory Visit (INDEPENDENT_AMBULATORY_CARE_PROVIDER_SITE_OTHER): Payer: No Typology Code available for payment source | Admitting: Neurology

## 2014-06-30 ENCOUNTER — Encounter: Payer: Self-pay | Admitting: Neurology

## 2014-06-30 VITALS — BP 132/89 | HR 79 | Ht 71.0 in | Wt 228.0 lb

## 2014-06-30 DIAGNOSIS — N62 Hypertrophy of breast: Secondary | ICD-10-CM

## 2014-06-30 DIAGNOSIS — F17200 Nicotine dependence, unspecified, uncomplicated: Secondary | ICD-10-CM

## 2014-06-30 DIAGNOSIS — R209 Unspecified disturbances of skin sensation: Secondary | ICD-10-CM

## 2014-06-30 DIAGNOSIS — IMO0001 Reserved for inherently not codable concepts without codable children: Secondary | ICD-10-CM

## 2014-06-30 DIAGNOSIS — R5381 Other malaise: Secondary | ICD-10-CM

## 2014-06-30 DIAGNOSIS — R5383 Other fatigue: Secondary | ICD-10-CM

## 2014-06-30 DIAGNOSIS — R202 Paresthesia of skin: Secondary | ICD-10-CM

## 2014-06-30 DIAGNOSIS — R531 Weakness: Secondary | ICD-10-CM

## 2014-06-30 NOTE — Patient Instructions (Signed)
Overall you are doing fairly well but I do want to suggest a few things today:   Remember to drink plenty of fluid, eat healthy meals and do not skip any meals. Try to eat protein with a every meal and eat a healthy snack such as fruit or nuts in between meals. Try to keep a regular sleep-wake schedule and try to exercise daily, particularly in the form of walking, 20-30 minutes a day, if you can.   As far as your medications are concerned, I would like to suggest  As far as diagnostic testing: MRi of the brain (we will call you), EMG/NCS (please schedule with me at checkout), Labs (today)  I would like to see you back in 3 months, sooner if we need to. Please call us with any interim questions, concerns, problems, updates or refill requests.   Please also call us for any test results so we can go over those with you on the phone.  My clinical assistant and will answer any of your questions and relay your messages to me and also relay most of my messages to you.   Our phone number is [phone]. We also have an after hours call service for urgent matters and there is a physician on-call for urgent questions. For any emergencies you know to call 911 or go to the nearest emergency room

## 2014-06-30 NOTE — Progress Notes (Addendum)
[facility]    Provider:  Dr Jaynee Eagles Referring Provider: Glenford Bayley, DO Primary Care Physician:  Leotis Pain, DO  CC:  Numbness, weakness, tingling  HPI:  Cody Rodriguez is a 28 y.o. male here as a referral from Dr. Marin Comment for numbness, tingling. He reports burning sensation in the feet and hands. Started in July on the left side,  in the fingers and toes. Now spreading in the chest and stomach and in the back. He has tightness and numbness in the chest, back and hands. Was working at [employer] and was doing a lot of manual physical work when it started. No discrete inciting factors. Did not fall, no trauma. Symptoms are from the wrists down and the whole foot from the ankle down. Gets to 7/10 in pain. Not worse n the day or night. Feels weak in the distal legs and the whole arms. He can't grip stuff or pick stuff up, dropping objects. Writing is getting worse, can't fill out paperwork. Has been taking tylenol, alleve and mobic not helping. Symptoms are there all day, always 7/10. Not worse at night. Nothing helps, cold makes it worse. Denies diabetes. No ntom onset: no chemotherapy, medications for gout, no medications fot TB, not exposed to heavy metals, no recent antibiotics or illnesses. No neuromuscular disorders in the family. The location is staying the same but the sensations are getting worse. Waking him up in the middle of the night, tries to shake the hands in the middle of the night. Denies alcohol. Symptoms are Symmetric. No SOB, smokes 1/2 pack a day, no CP no nausea, no vomiting.  Feels like a belt going around his thorax all the way around like someone is squeezing, numbness. No neck pain.   No significant PMHx. Denies diabetes. No FHx of neuromuscular or neurodegenerative disorders. No personal or family history of autoimmune disorders or MS.    Reviewed notes, labs and imaging from outside physicians, which showed: Patient was seen in the ED twice due to  symptoms, at the beginning of August was given prednisone which made him feel better however he did not tolerate the medication.  TSH WNL, ESR WNL, hgba1c 5.1, cbc/cmp wnl, rpr and HIV non rective.     Review of Systems: Patient complains of symptoms per HPI as well as the following symptoms numbness, weakness, sleepiness, restless legs. Pertinent negatives per HPI. All others negative.   History   Social History  . Marital Status: Single    Spouse Name: N/A    Number of Children: 0  . Years of Education: 12   Occupational History  . unemployed    Social History Main Topics  . Smoking status: Current Every Day Smoker -- 0.50 packs/day for 10 years    Types: Cigarettes  . Smokeless tobacco: Never Used  . Alcohol Use: Yes     Comment: occasional  . Drug Use: No  . Sexual Activity: Not on file   Other Topics Concern  . Not on file   Social History Narrative   Patient drinks 3 12 oz sodas per day. He also eats chocolate occasionally.    Family History  Problem Relation Age of Onset  . Healthy Mother   . Hypertension Father   . Healthy Sister   . Diabetes Maternal Aunt   . Healthy Brother     History reviewed. No pertinent past medical history.  Past Surgical History  Procedure Laterality Date  . Rotator cuff repair Left   .  Finger surgery Left     left pinky    Current Outpatient Prescriptions  Medication Sig Dispense Refill  . acetaminophen (TYLENOL) 500 MG tablet Take 500 mg by mouth every 6 (six) hours as needed.       No current facility-administered medications for this visit.    Allergies as of 06/30/2014  . (No Known Allergies)    Vitals: BP 132/89  Pulse 79  Ht 5' 11"  (1.803 m)  Wt 228 lb (103.42 kg)  BMI 31.81 kg/m2 Last Weight:  Wt Readings from Last 1 Encounters:  06/30/14 228 lb (103.42 kg)   Last Height:   Ht Readings from Last 1 Encounters:  06/30/14 5' 11"  (1.803 m)     Physical exam: Exam: Gen: NAD, conversant, well  nourised, overweight , well groomed                     CV: RRR, no MRG. No Carotid Bruits. No peripheral edema, warm, nontender Eyes: Conjunctivae clear without exudates or hemorrhage   Neuro: Detailed Neurologic Exam  Speech:    Speech is normal; fluent and spontaneous with normal comprehension.  Cognition:    The patient is oriented to person, place, and time;     recent and remote memory intact;     language fluent;     normal attention, concentration, fund of knowledge Cranial Nerves:    The pupils are equal, round, and reactive to light. The fundi are normal and spontaneous venous pulsations are present. Visual fields are full to finger confrontation. Extraocular movements are intact. Trigeminal sensation is intact and the muscles of mastication are normal. The face is symmetric. The palate elevates in the midline. Voice is normal. Shoulder shrug is normal. The tongue has normal motion without fasciculations.   Coordination:    Normal finger to nose and heel to shin.   Gait:    Heel-toe and tandem gait are normal.   Motor Observation:    No asymmetry, no atrophy, and no involuntary movements noted. Tone:    Normal muscle tone.    Posture:    Posture is normal. normal erect    Strength:    Weak opponens, APB, fdp median and ADM bilat in the hands. Otherwise strength is V/V in the upper and lower limbs.      Sensation:  Patchy sensory loss in the thorax from t2-t8. Decreased pp distally in the LE to below the knee 7 seconds bilat vibration at the great toes     Reflex Exam:  DTR's:    Deep tendon reflexes in the upper and lower extremities are normal bilaterally.   Toes:    The toes are downgoing bilaterally.   Clonus:    Clonus is absent.     Assessment/Plan:  28 year old male who describes worsening paresthesias in the extremities and thorax since July. Neuro exam significant for Patchy sensory loss in the thorax from t2-t8, decreased pp distally in the LE to  below the knee, impaired bilat vibration at the great toes. He has a sensation like a belt around his upper thorax. Was given prednisone which made him feel beteer. Given symptoms, will need to rule out MS with lesions in the brain and/or spinal cord. Will start with MRI of the brain and then consider imaging the cervical and thoracic cord pending further workup with EMG/NCS and neuropathy labs. Also noticed gynecomastia on exam, will refer to endocrinology for workup. Follow up within the next month for EMG/NCS.  Encouraged smoking cessation.   Also with some distal median weakness, will also check for CTS on emg/ncs.   F/u within 4 weeks for emg/ncs  Sarina Ill, MD  [facility] [address] [place], [place] [address]  Phone [phone] Fax [phone]  Lenor Coffin

## 2014-07-02 ENCOUNTER — Encounter: Payer: Self-pay | Admitting: Neurology

## 2014-07-02 DIAGNOSIS — N62 Hypertrophy of breast: Secondary | ICD-10-CM | POA: Insufficient documentation

## 2014-07-02 DIAGNOSIS — R202 Paresthesia of skin: Secondary | ICD-10-CM | POA: Insufficient documentation

## 2014-07-02 DIAGNOSIS — R531 Weakness: Secondary | ICD-10-CM | POA: Insufficient documentation

## 2014-07-02 DIAGNOSIS — F17200 Nicotine dependence, unspecified, uncomplicated: Secondary | ICD-10-CM | POA: Insufficient documentation

## 2014-07-02 HISTORY — DX: Hypertrophy of breast: N62

## 2014-07-05 LAB — IFE AND PE, SERUM
Albumin SerPl Elph-Mcnc: 3.7 g/dL (ref 3.2–5.6)
Albumin/Glob SerPl: 1.1 (ref 0.7–2.0)
Alpha 1: 0.3 g/dL (ref 0.1–0.4)
Alpha2 Glob SerPl Elph-Mcnc: 1 g/dL (ref 0.4–1.2)
B-Globulin SerPl Elph-Mcnc: 1.4 g/dL — ABNORMAL HIGH (ref 0.6–1.3)
Gamma Glob SerPl Elph-Mcnc: 1 g/dL (ref 0.5–1.6)
Globulin, Total: 3.7 g/dL (ref 2.0–4.5)
IgA/Immunoglobulin A, Serum: 232 mg/dL (ref 91–414)
IgG (Immunoglobin G), Serum: 1167 mg/dL (ref 700–1600)
IgM (Immunoglobulin M), Srm: 56 mg/dL (ref 40–230)
Total Protein: 7.4 g/dL (ref 6.0–8.5)

## 2014-07-05 LAB — SJOGREN'S SYNDROME ANTIBODS(SSA + SSB)
ENA SSA (RO) Ab: 0.2 AI (ref 0.0–0.9)
ENA SSB (LA) Ab: <0.2 AI (ref 0.0–0.9)

## 2014-07-05 LAB — PAN-ANCA
ANCA Proteinase 3: <3.5 U/mL (ref 0.0–3.5)
Atypical pANCA: <1:20 {titer}
C-ANCA: <1:20 {titer}
Myeloperoxidase Ab: <9 U/mL (ref 0.0–9.0)
P-ANCA: <1:20 {titer}

## 2014-07-05 LAB — ANGIOTENSIN CONVERTING ENZYME: Angio Convert Enzyme: 56 U/L (ref 14–82)

## 2014-07-05 LAB — LYME, TOTAL AB TEST/REFLEX: Lyme IgG/IgM Ab: <0.91 {ISR} (ref 0.00–0.90)

## 2014-07-07 ENCOUNTER — Ambulatory Visit (INDEPENDENT_AMBULATORY_CARE_PROVIDER_SITE_OTHER): Payer: No Typology Code available for payment source | Admitting: Neurology

## 2014-07-07 ENCOUNTER — Encounter (INDEPENDENT_AMBULATORY_CARE_PROVIDER_SITE_OTHER): Payer: Self-pay

## 2014-07-07 DIAGNOSIS — R202 Paresthesia of skin: Secondary | ICD-10-CM

## 2014-07-07 DIAGNOSIS — M792 Neuralgia and neuritis, unspecified: Secondary | ICD-10-CM

## 2014-07-07 DIAGNOSIS — Z0289 Encounter for other administrative examinations: Secondary | ICD-10-CM

## 2014-07-07 DIAGNOSIS — M545 Low back pain, unspecified: Secondary | ICD-10-CM

## 2014-07-07 DIAGNOSIS — G35 Multiple sclerosis: Secondary | ICD-10-CM

## 2014-07-07 MED ORDER — GABAPENTIN (ONCE-DAILY) 600 MG PO TABS: 1800.0000 mg | ORAL_TABLET | Freq: Every day | ORAL | Status: DC

## 2014-07-07 NOTE — Progress Notes (Addendum)
  [facility]    Provider:  Dr Lucia Gaskins Referring Provider: Lenell Antu, DO Primary Care Physician:  Rockne Coons, DO  History: 28 year old male who describes worsening paresthesias in all the extremities and thorax since July. He endorses low back pain. Neuro exam significant for patchy sensory loss in the thorax from t2-t8, decreased pp distally in the LE to below the knee, impaired bilat vibration at the great toes.   Summary: Nerve conduction studies were performed on the left upper and bilateral lower extremities.  Left Peroneal and left Tibial motor conductions were within normal limits with normal F Wave latencies.  Left Sural and left Peroneal sensory conductions were within normal limits Bilateral H Reflexes were within normal limits   Left Median and left Ulnar motor conductions were within normal limits with normal F Wave latencies.  Left Median, left Ulnar and left Radial sensory conductions were within normal limits  EMG needle study was performed on selected left extremity muscles and paraspinal muscles. The left L5 and S1 paraspinal and the left Abductor Hallucis muscles showed increased spontaneous activity. The Vastus Medialis, Anterior Tibialis, Medial Gastrocnemius, Extensor Hallucis Longus, Biceps Femoris (long head), Gluteus Maximus, Deltoid, Biceps, Triceps, Opponens Pollicis and First Doral Interosseous muscles, left T9 and T10 paraspinal muscles were within normal limits.  Conclusion:  This is an abnormal study. There is acute/ongoing denervation seen in the left lumbar paraspinals and a distal S1 left leg muscle. The changes seen in the distal foot muscle could be due to peripheral polyneuropathy but given the normal sensory potentials, would favor S1 radiculopathy. However lumbosacral radiculopathy does not explain patient's thoracic and cervical sensory complaints. Consider MRI of the Cervical and Thoracic spinal cord to evaluate for central lesions.  Clinical correlation suggested.    [name], MD  [facility] [address] [place], [place] [address]  Phone [phone] Fax [phone]

## 2014-07-07 NOTE — Addendum Note (Signed)
Addended by: [name] B on: 07/07/2014 04:09 PM   Modules accepted: Level of Service

## 2014-07-18 ENCOUNTER — Telehealth: Payer: Self-pay | Admitting: *Deleted

## 2014-07-18 NOTE — Telephone Encounter (Signed)
Please let patient know that their labs were normal.

## 2014-10-02 ENCOUNTER — Ambulatory Visit (INDEPENDENT_AMBULATORY_CARE_PROVIDER_SITE_OTHER): Payer: No Typology Code available for payment source | Admitting: Neurology

## 2014-10-02 ENCOUNTER — Encounter: Payer: Self-pay | Admitting: Neurology

## 2014-10-02 VITALS — BP 134/85 | HR 84 | Temp 98.0°F | Ht 70.0 in | Wt 238.0 lb

## 2014-10-02 DIAGNOSIS — M79602 Pain in left arm: Secondary | ICD-10-CM | POA: Insufficient documentation

## 2014-10-02 DIAGNOSIS — M79601 Pain in right arm: Secondary | ICD-10-CM | POA: Insufficient documentation

## 2014-10-02 DIAGNOSIS — R202 Paresthesia of skin: Secondary | ICD-10-CM

## 2014-10-02 DIAGNOSIS — R200 Anesthesia of skin: Secondary | ICD-10-CM

## 2014-10-02 MED ORDER — GABAPENTIN 300 MG PO CAPS: 300.0000 mg | ORAL_CAPSULE | Freq: Three times a day (TID) | ORAL | Status: DC

## 2014-10-02 NOTE — Progress Notes (Signed)
[facility]    Provider:  Dr Lucia Gaskins Referring Provider: Lenell Antu, DO Primary Care Physician:  Rockne Coons, DO  CC:  paresthesias  HPI:  Cody Rodriguez is a 28 y.o. male here as a referral from Dr. Conley Rolls for paresthesias  Interval history 10/02/2014: Patient is still reports numbness and tingling in the fingers and toes. He has not been able to get imaging due to financial problems. 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 The symptoms are worse at night, not worse throughout the day. He can tolerate the symptoms throughout the day. No weakness, no new symptoms. Symptoms improved on the prednisone 5mg  but he gained a lot of weight and hasn't lost it yet.    Reviewed notes, labs and imaging from outside physicians, which showed:  Emg/ncs 07/07/2014: This is an abnormal study. There is acute/ongoing denervation seen in the left lumbar paraspinals and a distal S1 left leg muscle. The changes seen in the distal foot muscle could be due to peripheral polyneuropathy but given the normal sensory potentials, would favor S1 radiculopathy. However lumbosacral radiculopathy does not explain patient's thoracic and cervical sensory complaints. Consider MRI of the Cervical and Thoracic spinal cord to evaluate for central lesions. Clinical correlation suggested  Extensive labwork WNL including b12, ace, hgba1c, ana, pan-anca, rf, tsh, sjogrens, lyme, rpr, hiv, ana  Review of Systems: Patient complains of symptoms per HPI as well as the following symptoms numbness, no cp/sob. Pertinent negatives per HPI. All others negative.   History   Social History  . Marital Status: Single    Spouse Name: N/A    Number of Children: 0  . Years of Education: 12   Occupational History  . unemployed    Social History Main Topics  . Smoking status: Current Every Day Smoker -- 0.50 packs/day for 10 years    Types: Cigarettes  . Smokeless tobacco: Never Used  .  Alcohol Use: Yes     Comment: occasional  . Drug Use: No  . Sexual Activity: Not on file   Other Topics Concern  . Not on file   Social History Narrative   Patient drinks 3 12 oz sodas per day. He also eats chocolate occasionally.    Family History  Problem Relation Age of Onset  . Healthy Mother   . Hypertension Father   . Healthy Sister   . Diabetes Maternal Aunt   . Healthy Brother   . Neuropathy Neg Hx   . Multiple sclerosis Neg Hx     Past Medical History  Diagnosis Date  . Paresthesia and pain of both upper extremities     also in right leg    Past Surgical History  Procedure Laterality Date  . Rotator cuff repair Left   . Finger surgery Left     left pinky    Current Outpatient Prescriptions  Medication Sig Dispense Refill  . acetaminophen (TYLENOL) 500 MG tablet Take 500 mg by mouth every 6 (six) hours as needed.     No current facility-administered medications for this visit.    Allergies as of 10/02/2014  . (No Known Allergies)    Vitals: BP 134/85 mmHg  Pulse 84  Temp(Src) 98 F (36.7 C) (Oral)  Ht 5\' 10"  (1.778 m)  Wt 238 lb (107.956 kg)  BMI 34.15 kg/m2 Last Weight:  Wt Readings from Last 1 Encounters:  10/02/14 238 lb (107.956 kg)   Last Height:   Ht  Readings from Last 1 Encounters:  10/02/14 5\' 10"  (1.778 m)    Physical exam: Exam: Gen: NAD, conversant, well nourised, overweight , well groomed  CV: RRR, no MRG. No Carotid Bruits. No peripheral edema, warm, nontender Eyes: Conjunctivae clear without exudates or hemorrhage   Neuro: No changes, stable Detailed Neurologic Exam  Speech:  Speech is normal; fluent and spontaneous with normal comprehension.  Cognition:  The patient is oriented to person, place, and time;   recent and remote memory intact;   language fluent;   normal attention, concentration, fund of knowledge Cranial Nerves:  The pupils are equal, round, and reactive to light.  The fundi are normal and spontaneous venous pulsations are present. Visual fields are full to finger confrontation. Extraocular movements are intact. Trigeminal sensation is intact and the muscles of mastication are normal. The face is symmetric. The palate elevates in the midline. Voice is normal. Shoulder shrug is normal. The tongue has normal motion without fasciculations.   Coordination:  Normal finger to nose and heel to shin.   Gait:  Heel-toe and tandem gait are normal.   Motor Observation:  No asymmetry, no atrophy, and no involuntary movements noted. Tone:  Normal muscle tone.   Posture:  Posture is normal. normal erect   Strength:  Weak opponens, APB, fdp median and ADM bilat in the hands. Otherwise strength is V/V in the upper and lower limbs.    Sensation:  Patchy sensory loss in the thorax from t2-t8. Decreased pp distally in the LE to below the knee 7 seconds bilat vibration at the great toes   Reflex Exam:  DTR's:  Deep tendon reflexes in the upper and lower extremities are normal bilaterally.  Toes:  The toes are downgoing bilaterally.  Clonus:  Clonus is absent.     Assessment/Plan:  28 year old male who describes worsening paresthesias in the extremities and thorax since July. Neuro exam significant for Patchy sensory loss in the thorax from t2-t8, decreased pp distally in the LE to below the knee, impaired bilat vibration at the great toes. He has a sensation like a belt around his upper thorax. Was given prednisone which made him feel better. Given symptoms, will need to rule out MS with lesions in the brain and/or spinal cord. Highly recommended imaging and will try and reorder after the first of the year due to insurance issues, explained differential to patient and reasons why he should have imaging. Neurontin tid prin.  Encouraged smoking cessation.   Naomie DeanAntonia Ahern, MD  [facility] [address]  [address] [place], [place] [address]  Phone [phone] Fax [phone]

## 2014-10-02 NOTE — Patient Instructions (Signed)
Overall you are doing fairly well but I do want to suggest a few things today:   Remember to drink plenty of fluid, eat healthy meals and do not skip any meals. Try to eat protein with a every meal and eat a healthy snack such as fruit or nuts in between meals. Try to keep a regular sleep-wake schedule and try to exercise daily, particularly in the form of walking, 20-30 minutes a day, if you can.   As far as your medications are concerned, I would like to suggest: Neurontin 300mg  take 1-2 tabs up to 3x a day  As far as diagnostic testing: MRI of the brain and spinal cord  I would like to see you back in 3 months, sooner if we need to. Please call us with any interim questions, concerns, problems, updates or refill requests.   Please also call us for any test results so we can go over those with you on the phone.  My clinical assistant and will answer any of your questions and relay your messages to me and also relay most of my messages to you.   Our phone number is [phone]. We also have an after hours call service for urgent matters and there is a physician on-call for urgent questions. For any emergencies you know to call 911 or go to the nearest emergency room

## 2014-10-12 ENCOUNTER — Other Ambulatory Visit: Payer: Self-pay | Admitting: Neurology

## 2014-10-12 DIAGNOSIS — G35 Multiple sclerosis: Secondary | ICD-10-CM

## 2015-01-01 ENCOUNTER — Ambulatory Visit (INDEPENDENT_AMBULATORY_CARE_PROVIDER_SITE_OTHER): Payer: No Typology Code available for payment source | Admitting: Neurology

## 2015-01-01 ENCOUNTER — Encounter: Payer: Self-pay | Admitting: Neurology

## 2015-01-01 VITALS — BP 129/81 | HR 72 | Temp 97.9°F | Ht 70.0 in | Wt 237.0 lb

## 2015-01-01 DIAGNOSIS — G609 Hereditary and idiopathic neuropathy, unspecified: Secondary | ICD-10-CM | POA: Diagnosis not present

## 2015-01-01 DIAGNOSIS — R202 Paresthesia of skin: Secondary | ICD-10-CM | POA: Diagnosis not present

## 2015-01-01 MED ORDER — GABAPENTIN 300 MG PO CAPS: 300.0000 mg | ORAL_CAPSULE | Freq: Three times a day (TID) | ORAL | Status: DC

## 2015-01-01 NOTE — Progress Notes (Signed)
[facility]    Provider:  Dr Lucia Gaskins Referring Provider: Lenell Antu, DO Primary Care Physician:  Rockne Coons, DO  CC: paresthesias  Interval history 01/01/2015: Cody Rodriguez is a 29 y.o. male here as a referral from Dr. Conley Rolls for paresthesias. Sensation intact in the thorax, patchy sensory loss has resolved. Still reocmmend imaging,  will just do the brain and will cancel the others. neurontin working great.   Interval history 10/02/2014: Patient is still reports numbness and tingling in the fingers and toes. He has not been able to get imaging due to financial problems. 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 The symptoms are worse at night, not worse throughout the day. He can tolerate the symptoms throughout the day. No weakness, no new symptoms. Symptoms improved on the prednisone  but he gained a lot of weight and hasn't lost it yet.   Reviewed notes, labs and imaging from outside physicians, which showed:  Emg/ncs 07/07/2014: This is an abnormal study. There is acute/ongoing denervation seen in the left lumbar paraspinals and a distal S1 left leg muscle. The changes seen in the distal foot muscle could be due to peripheral polyneuropathy but given the normal sensory potentials, would favor S1 radiculopathy. However lumbosacral radiculopathy does not explain patient's thoracic and cervical sensory complaints. Consider MRI of the Cervical and Thoracic spinal cord to evaluate for central lesions. Clinical correlation suggested  Extensive labwork WNL including b12, ace, hgba1c, ana, pan-anca, rf, tsh, sjogrens, lyme, rpr, hiv, ana Review of Systems: Patient complains of symptoms per HPI as well as the following symptoms: No CP, No SOB. Pertinent negatives per HPI. All others negative.   History   Social History  . Marital Status: Single    Spouse Name: N/A  . Number of Children: 0  . Years of Education: 12   Occupational  History  . unemployed    Social History Main Topics  . Smoking status: Current Every Day Smoker -- 0.50 packs/day for 10 years    Types: Cigarettes  . Smokeless tobacco: Never Used  . Alcohol Use: No  . Drug Use: No  . Sexual Activity: Not on file   Other Topics Concern  . Not on file   Social History Narrative   Patient drinks 3 12 oz sodas per day. He also eats chocolate occasionally.    Family History  Problem Relation Age of Onset  . Healthy Mother   . Hypertension Father   . Healthy Sister   . Diabetes Maternal Aunt   . Healthy Brother   . Neuropathy Neg Hx   . Multiple sclerosis Neg Hx     Past Medical History  Diagnosis Date  . Paresthesia and pain of both upper extremities     also in right leg    Past Surgical History  Procedure Laterality Date  . Rotator cuff repair Left   . Finger surgery Left     left pinky    Current Outpatient Prescriptions  Medication Sig Dispense Refill  . acetaminophen (TYLENOL) 500 MG tablet Take 500 mg by mouth every 6 (six) hours as needed.    . gabapentin (NEURONTIN) 300 MG capsule Take 1 capsule (300 mg total) by mouth 3 (three) times daily. Can take 2 before bedtime. 90 capsule 11   No current facility-administered medications for this visit.    Allergies as of 01/01/2015  . (No Known Allergies)    Vitals: BP 129/81 mmHg  Pulse 72  Temp(Src) 97.9 F (36.6 C)  Ht 5\' 10"  (1.778 m)  Wt 237 lb (107.502 kg)  BMI 34.01 kg/m2 Last Weight:  Wt Readings from Last 1 Encounters:  01/01/15 237 lb (107.502 kg)   Last Height:   Ht Readings from Last 1 Encounters:  01/01/15 5\' 10"  (1.778 m)   Speech:  Speech is normal; fluent and spontaneous with normal comprehension.  Cognition:  The patient is oriented to person, place, and time;   recent and remote memory intact;   language fluent;   normal attention, concentration, fund of knowledge Cranial Nerves:  The pupils are equal, round, and reactive to  light. The fundi are normal and spontaneous venous pulsations are present. Visual fields are full to finger confrontation. Extraocular movements are intact. Trigeminal sensation is intact and the muscles of mastication are normal. The face is symmetric. The palate elevates in the midline. Voice is normal. Shoulder shrug is normal. The tongue has normal motion without fasciculations.   Motor Observation:  No asymmetry, no atrophy, and no involuntary movements noted. Tone:  Normal muscle tone.   Posture:  Posture is normal. normal erect   Strength:  Weak opponens, APB, fdp median and ADM bilat in the hands. Otherwise strength is V/V in the upper and lower limbs.    Sensation:  Patchy sensory loss in the thorax from t2-t8 seen on previous exams has resolved. Decreased pp distally in the LE to below the knee 7 seconds bilat vibration at the great toes       Assessment/Plan: 29 year old male who describes worsening paresthesias in the extremities and thorax since July - since resolved with Neurontin. Neuro exam significant for Patchy sensory loss in the thorax from t2-t8 (resolved), decreased pp distally in the LE to below the knee, impaired bilat vibration at the great toes. He has a sensation like a belt around his upper thorax  (states resolved). Was given prednisone which made him feel better. Given symptoms, will need to rule out MS with lesions in the brain and/or spinal cord. He can't afford it all, can start with just the brain. Highly recommended imaging, explained differential to patient and reasons why he should have imaging. Neurontin tid prin. Encouraged smoking cessation.   [name], MD  [facility] [address] [place], [place] [address]  Phone [phone] Fax [phone]  A total of 15 minutes was spent face-to-face with this patient. Over half this time was spent on counseling patient on the paresthesia diagnosis  and different diagnostic and therapeutic options available.

## 2015-10-09 ENCOUNTER — Encounter (HOSPITAL_BASED_OUTPATIENT_CLINIC_OR_DEPARTMENT_OTHER): Payer: Self-pay | Admitting: *Deleted

## 2015-10-09 ENCOUNTER — Telehealth: Payer: Self-pay | Admitting: Neurology

## 2015-10-09 ENCOUNTER — Emergency Department (HOSPITAL_BASED_OUTPATIENT_CLINIC_OR_DEPARTMENT_OTHER)
Admission: EM | Admit: 2015-10-09 | Discharge: 2015-10-09 | Disposition: A | Discharge status: Home / Self Care | Payer: PRIVATE HEALTH INSURANCE | Attending: Emergency Medicine | Admitting: Emergency Medicine

## 2015-10-09 DIAGNOSIS — R531 Weakness: Secondary | ICD-10-CM | POA: Diagnosis not present

## 2015-10-09 DIAGNOSIS — Z79899 Other long term (current) drug therapy: Secondary | ICD-10-CM | POA: Diagnosis not present

## 2015-10-09 DIAGNOSIS — R202 Paresthesia of skin: Secondary | ICD-10-CM

## 2015-10-09 DIAGNOSIS — R200 Anesthesia of skin: Secondary | ICD-10-CM | POA: Diagnosis present

## 2015-10-09 DIAGNOSIS — R51 Headache: Secondary | ICD-10-CM | POA: Insufficient documentation

## 2015-10-09 DIAGNOSIS — F17210 Nicotine dependence, cigarettes, uncomplicated: Secondary | ICD-10-CM | POA: Diagnosis not present

## 2015-10-09 MED ORDER — CYCLOBENZAPRINE HCL 10 MG PO TABS: 10.0000 mg | ORAL_TABLET | Freq: Two times a day (BID) | ORAL | Status: DC | PRN

## 2015-10-09 MED ORDER — PREDNISONE 10 MG PO TABS: 40.0000 mg | ORAL_TABLET | Freq: Every day | ORAL | Status: AC

## 2015-10-09 NOTE — ED Notes (Signed)
[facility]ia [facility]--spoke with Doug--to get Dr. Mervyn Skeeters. Lucia Gaskins

## 2015-10-09 NOTE — Telephone Encounter (Signed)
Called and spoke to father. He is not on HIPPA form. Explained I cannot discuss medical information but would like to schedule appt ASAP with Dr Lucia Gaskins. Pt not available to speak with. He will give message to him. Gave [facility] phone number for him to call back and schedule f/u appt for first available per Dr Lucia Gaskins request.

## 2015-10-09 NOTE — Telephone Encounter (Signed)
[place] ED called. Patient is in the ED for paresthesias of the arm. They are going to discharge him with prednisone. Please call him and schedule an appointment first available. thanks

## 2015-10-09 NOTE — Telephone Encounter (Signed)
Pt called back and made appt on 10/24/14. Ok per Dr Lucia Gaskins.

## 2015-10-09 NOTE — ED Notes (Signed)
C/o numbness from right side of scalp into right side of neck and down right arm x 5-6 days- States right side of neck feels "tight". Denies injury. Grips equal and strong, speech clear, facial symmetry present

## 2015-10-09 NOTE — ED Provider Notes (Signed)
CSN: [id number]     Arrival date & time 10/09/15  0854 History   First MD Initiated Contact with Patient 10/09/15 0935     Chief Complaint  Patient presents with  . Numbness     (Consider location/radiation/quality/duration/timing/severity/associated sxs/prior Treatment) HPI Comments: Numbness right arm to right side of head, started slowly Started Thursday Constant Arm weakness No nausea/vomiting HA for last 2 days "little stumble" on Saturday, otherwise no loss of balance No facial droop, difficulty talking, no change in vision Hx of similar parasthesias, was supposed to do MRI through Neurology Dr. Lucia Gaskins     Past Medical History  Diagnosis Date  . Paresthesia and pain of both upper extremities     also in right leg   Past Surgical History  Procedure Laterality Date  . Rotator cuff repair Left   . Finger surgery Left     left pinky   Family History  Problem Relation Age of Onset  . Healthy Mother   . Hypertension Father   . Healthy Sister   . Diabetes Maternal Aunt   . Healthy Brother   . Neuropathy Neg Hx   . Multiple sclerosis Neg Hx    Social History  Substance Use Topics  . Smoking status: Current Every Day Smoker -- 0.50 packs/day for 10 years    Types: Cigarettes  . Smokeless tobacco: Never Used  . Alcohol Use: 0.0 oz/week    0 Standard drinks or equivalent per week     Comment: 1x month    Review of Systems  Constitutional: Negative for fever.  HENT: Negative for sore throat.   Eyes: Negative for visual disturbance.  Respiratory: Negative for shortness of breath.   Cardiovascular: Negative for chest pain.  Gastrointestinal: Negative for nausea, vomiting and abdominal pain.  Genitourinary: Negative for difficulty urinating.  Musculoskeletal: Negative for back pain and neck stiffness.  Skin: Negative for rash.  Neurological: Positive for weakness, numbness and headaches (no current HA, when it comes on 6-7/10, pressure). Negative for dizziness,  syncope, facial asymmetry and light-headedness.      Allergies  Review of patient's allergies indicates no known allergies.  Home Medications   Prior to Admission medications   Medication Sig Start Date End Date Taking? Authorizing Provider  acetaminophen (TYLENOL) 500 MG tablet Take 500 mg by mouth every 6 (six) hours as needed.   Yes Historical Provider, MD  cyclobenzaprine (FLEXERIL) 10 MG tablet Take 1 tablet (10 mg total) by mouth 2 (two) times daily as needed for muscle spasms. 10/09/15   Alvira Monday, MD  gabapentin (NEURONTIN) 300 MG capsule Take 1 capsule (300 mg total) by mouth 3 (three) times daily. Can take 2 before bedtime. 01/01/15   Anson Fret, MD  predniSONE (DELTASONE) 10 MG tablet Take 4 tablets (40 mg total) by mouth daily. 10/09/15 10/12/15  Alvira Monday, MD   BP 143/81 mmHg  Pulse 95  Temp(Src) 98.4 F (36.9 C) (Oral)  Resp 18  Ht 5\' 11"  (1.803 m)  Wt 240 lb (108.863 kg)  BMI 33.49 kg/m2  SpO2 100% Physical Exam  Constitutional: He is oriented to person, place, and time. He appears well-developed and well-nourished. No distress.  HENT:  Head: Normocephalic and atraumatic.  Eyes: Conjunctivae and EOM are normal.  Neck: Normal range of motion.  Cardiovascular: Normal rate, regular rhythm, normal heart sounds and intact distal pulses.  Exam reveals no gallop and no friction rub.   No murmur heard. Pulmonary/Chest: Effort normal and breath sounds normal.  No respiratory distress. He has no wheezes. He has no rales.  Abdominal: Soft. He exhibits no distension. There is no tenderness. There is no guarding.  Musculoskeletal: He exhibits no edema.  Neurological: He is alert and oriented to person, place, and time. He has normal strength. No cranial nerve deficit or sensory deficit. He displays a negative Romberg sign. Coordination and gait normal. GCS eye subscore is 4. GCS verbal subscore is 5. GCS motor subscore is 6.  Pt reports some tingling of right shoulder  with palpation, however denies numbness or alteration of sensation in other areas. No weakness  Skin: Skin is warm and dry. He is not diaphoretic.  Nursing note and vitals reviewed.   ED Course  Procedures (including critical care time) Labs Review Labs Reviewed - No data to display  Imaging Review No results found. I have personally reviewed and evaluated these images and lab results as part of my medical decision-making.   EKG Interpretation None      MDM   Final diagnoses:  Numbness and tingling of right arm   30 year old male with a history of paresthesias for which she seen neurology and they recommended an MRI for evaluation of possible MS presents with concern of paresthesias of the right arm. Patient's neurologic exam is normal without any numbness on my evaluation, no weakness and no other neurologic abnormalities. He does not have any risk factors for CVA (with exception of smoking) and have low suspicion for this by history and physical exam.  No significant headache to indicate SAH.  Discussed with patient's neurologist Dr. [name], who will schedule patient for an outpatient MRI and close follow-up. Recommended a short course of steroids.  Pt also with tenderness of right trapezius and given rx for flexeril as muscle spasm another possible etiology of symptoms. No CP and SOB and doubt intrathoracic cause of symptoms.    Pt discharged with prescription for prednisone and prednisone and Flexeril will follow-up with neurology as an outpatient.  Alvira Monday, MD 10/09/15 [phone]

## 2015-10-25 ENCOUNTER — Ambulatory Visit (INDEPENDENT_AMBULATORY_CARE_PROVIDER_SITE_OTHER): Payer: PRIVATE HEALTH INSURANCE | Admitting: Neurology

## 2015-10-25 ENCOUNTER — Encounter: Payer: Self-pay | Admitting: Neurology

## 2015-10-25 VITALS — BP 146/90 | HR 94 | Ht 71.0 in | Wt 241.8 lb

## 2015-10-25 DIAGNOSIS — G35 Multiple sclerosis: Secondary | ICD-10-CM | POA: Diagnosis not present

## 2015-10-25 MED ORDER — GABAPENTIN 300 MG PO CAPS: 600.0000 mg | ORAL_CAPSULE | Freq: Three times a day (TID) | ORAL | Status: DC

## 2015-10-25 MED ORDER — PREDNISONE 10 MG PO TABS: 10.0000 mg | ORAL_TABLET | Freq: Every day | ORAL | Status: DC

## 2015-10-25 MED ORDER — CYCLOBENZAPRINE HCL 10 MG PO TABS: 10.0000 mg | ORAL_TABLET | Freq: Two times a day (BID) | ORAL | Status: DC | PRN

## 2015-10-25 NOTE — Progress Notes (Signed)
[facility]    Provider:  Dr Lucia Gaskins Referring Provider: Lenell Antu, DO Primary Care Physician:  Rockne Coons, DO  CC: paresthesias  Interval history 10/25/2015: he had an episode of numbness on the right side of the head, stiff neck, numb and tingly.Acute onset, no inciting factors. Pain was severe and he had to go to the ED.  Again, prednisone helped. His hands were burning. He had right arm weakness. Symptoms resolved with the steroids. Patient is having discrete neurologic events that resolve quickly with steroids. I order his imaging again today, we really need to rule out MS or other central disorder. He has insurance now, hopefully we can get it done.   Interval history 01/01/2015: Brix Brearley is a 30 y.o. male here as a referral from Dr. Conley Rolls for paresthesias. Sensation intact in the thorax, patchy sensory loss has resolved. Still reocmmend imaging, will just do the brain and will cancel the others. neurontin working great.   Interval history 10/02/2014: Patient is still reports numbness and tingling in the fingers and toes. He has not been able to get imaging due to financial problems. 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 The symptoms are worse at night, not worse throughout the day. He can tolerate the symptoms throughout the day. No weakness, no new symptoms. Symptoms improved on the prednisone  but he gained a lot of weight and hasn't lost it yet.   Reviewed notes, labs and imaging from outside physicians, which showed:  Emg/ncs 07/07/2014: This is an abnormal study. There is acute/ongoing denervation seen in the left lumbar paraspinals and a distal S1 left leg muscle. The changes seen in the distal foot muscle could be due to peripheral polyneuropathy but given the normal sensory potentials, would favor S1 radiculopathy. However lumbosacral radiculopathy does not explain patient's thoracic and cervical sensory complaints.  Consider MRI of the Cervical and Thoracic spinal cord to evaluate for central lesions. Clinical correlation suggested  Extensive labwork WNL including b12, ace, hgba1c, ana, pan-anca, rf, tsh, sjogrens, lyme, rpr, hiv, ana Review of Systems: Patient complains of symptoms per HPI as well as the following symptoms: No CP, No SOB. Pertinent negatives per HPI. All others negative.    Social History   Social History  . Marital Status: Single    Spouse Name: N/A  . Number of Children: 0  . Years of Education: 12   Occupational History  . unemployed    Social History Main Topics  . Smoking status: Current Every Day Smoker -- 0.50 packs/day for 10 years    Types: Cigarettes  . Smokeless tobacco: Never Used     Comment: 10/25/15 cut back a little  . Alcohol Use: 0.0 oz/week    0 Standard drinks or equivalent per week     Comment: 1x month  . Drug Use: No  . Sexual Activity: Not on file   Other Topics Concern  . Not on file   Social History Narrative   Patient drinks 3 12 oz sodas per day. He also eats chocolate occasionally.    Family History  Problem Relation Age of Onset  . Healthy Mother   . Hypertension Father   . Healthy Sister   . Diabetes Maternal Aunt   . Healthy Brother   . Neuropathy Neg Hx   . Multiple sclerosis Neg Hx     Past Medical History  Diagnosis Date  . Paresthesia and pain of both upper extremities  also in right leg    Past Surgical History  Procedure Laterality Date  . Rotator cuff repair Left   . Finger surgery Left     left pinky    Current Outpatient Prescriptions  Medication Sig Dispense Refill  . acetaminophen (TYLENOL) 500 MG tablet Take 500 mg by mouth every 6 (six) hours as needed.    . cyclobenzaprine (FLEXERIL) 10 MG tablet Take 1 tablet (10 mg total) by mouth 2 (two) times daily as needed for muscle spasms. 20 tablet 0  . predniSONE (DELTASONE) 10 MG tablet Take 10 mg by mouth 3 (three) times daily. Began 10/16/15    .  gabapentin (NEURONTIN) 300 MG capsule Take 1 capsule (300 mg total) by mouth 3 (three) times daily. Can take 2 before bedtime. (Patient not taking: Reported on 10/25/2015) 90 capsule 11   No current facility-administered medications for this visit.    Allergies as of 10/25/2015  . (No Known Allergies)    Vitals: BP 146/90 mmHg  Pulse 94  Ht 5\' 11"  (1.803 m)  Wt 241 lb 12.8 oz (109.68 kg)  BMI 33.74 kg/m2 Last Weight:  Wt Readings from Last 1 Encounters:  10/25/15 241 lb 12.8 oz (109.68 kg)   Last Height:   Ht Readings from Last 1 Encounters:  10/25/15 5\' 11"  (1.803 m)     Speech:  Speech is normal; fluent and spontaneous with normal comprehension.  Cognition:  The patient is oriented to person, place, and time;   recent and remote memory intact;   language fluent;   normal attention, concentration, fund of knowledge Cranial Nerves:  The pupils are equal, round, and reactive to light. The fundi are normal and spontaneous venous pulsations are present. Nystagmus on end gaze horizontal. Visual fields are full to finger confrontation. Extraocular movements are intact. Trigeminal sensation is intact and the muscles of mastication are normal. The face is symmetric. The palate elevates in the midline. Voice is normal. Shoulder shrug is normal. The tongue has normal motion without fasciculations.   Motor Observation:  No asymmetry, no atrophy, and no involuntary movements noted. Tone:  Normal muscle tone.   Posture:  Posture is normal. normal erect   Strength:  Otherwise strength is V/V in the upper and lower limbs.    Sensation:  Intact to pin prick in the extremities and thorax/abdomen   DTRs: WNL  Toes: downgoing bilat     Assessment/Plan: 30 year old male who originally was seen for worsening paresthesias in the extremities and thorax since July - since resolved with Neurontin. Neuro exam was significant for Patchy sensory loss in  the thorax from t2-t8 (resolved), he had a sensation like a belt around his upper thorax (states resolved). Was given prednisone which made him feel better. Today he returns after he had an episode of acute onset numbness, pain, burning on the right side of the head, stiff neck, numb and tingly in the right arm with weakness of right arm. . Symptoms resolved with steroids again. Patient is having discrete neurologic events that resolve quickly with steroids. I order his imaging again today, we really need to rule out MS or other central disorder. He has insurance now, hopefully we can get it done.  Neurontin tid prin. Encouraged smoking cessation.   [name], MD  [facility] [address] [place], [place] [address]  Phone [phone] Fax [phone]  A total of 30 minutes was spent face-to-face with this patient. Over half this time was  spent on counseling patient on the possible MS diagnosis and different diagnostic and therapeutic options available.

## 2015-10-25 NOTE — Addendum Note (Signed)
Addended by: [name] B on: 10/25/2015 10:47 AM   Modules accepted: Orders

## 2017-05-12 ENCOUNTER — Encounter (HOSPITAL_COMMUNITY): Payer: Self-pay | Admitting: *Deleted

## 2017-05-12 ENCOUNTER — Emergency Department (HOSPITAL_COMMUNITY): Payer: 59

## 2017-05-12 DIAGNOSIS — Z5321 Procedure and treatment not carried out due to patient leaving prior to being seen by health care provider: Secondary | ICD-10-CM | POA: Diagnosis not present

## 2017-05-12 DIAGNOSIS — R197 Diarrhea, unspecified: Secondary | ICD-10-CM | POA: Diagnosis not present

## 2017-05-12 DIAGNOSIS — R202 Paresthesia of skin: Secondary | ICD-10-CM | POA: Diagnosis not present

## 2017-05-12 DIAGNOSIS — R42 Dizziness and giddiness: Secondary | ICD-10-CM | POA: Diagnosis not present

## 2017-05-12 IMAGING — CT CT HEAD W/O CM
3 series · 16 of 47 positions shown, 19 images · non-contrast
Comparison: None.

CLINICAL DATA: Dizziness and nausea.

EXAM:
CT HEAD WITHOUT CONTRAST
TECHNIQUE: Contiguous axial images were obtained from the base of the skull
through the vertex without intravenous contrast.

[Series 3: head 5.0 h30s · axial · 0.43mm/px · z∈[-105,+20]mm · 10 of 31 slices shown, 13 images]
[im 3/31  brain]
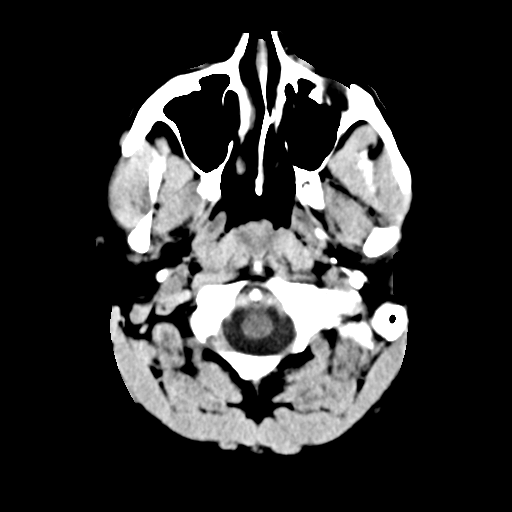
[im 3/31  bone]
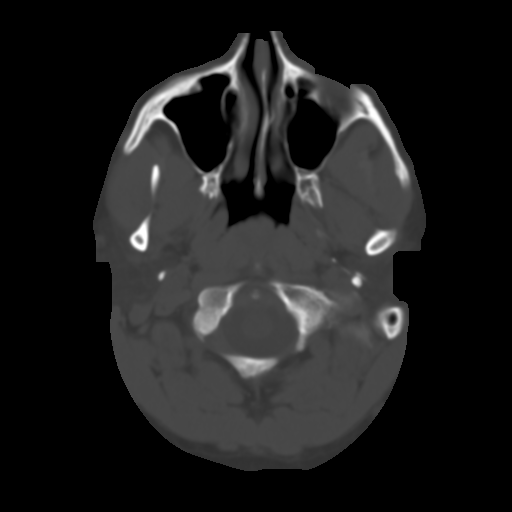
[im 6/31  brain]
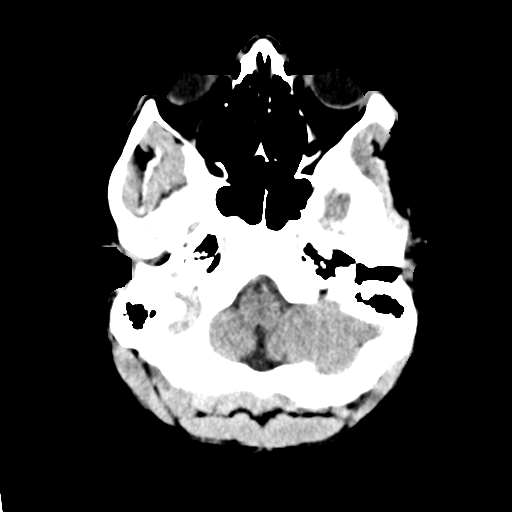
[im 9/31  brain]
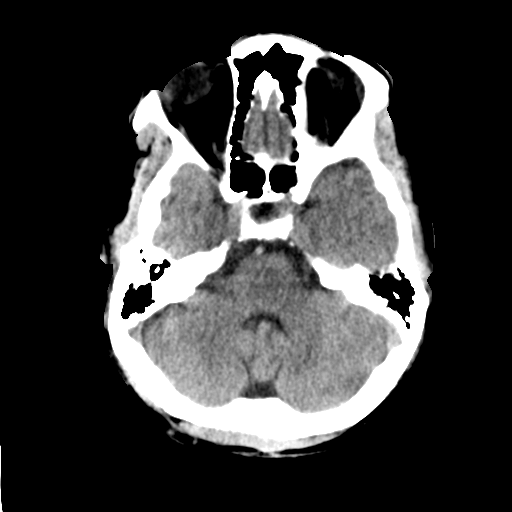
[im 11/31  brain]
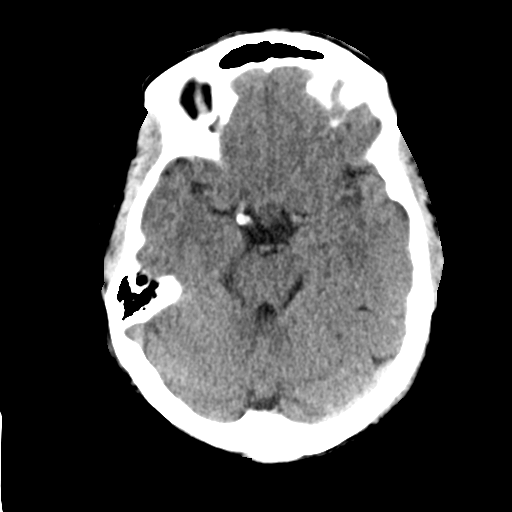
[im 14/31  brain]
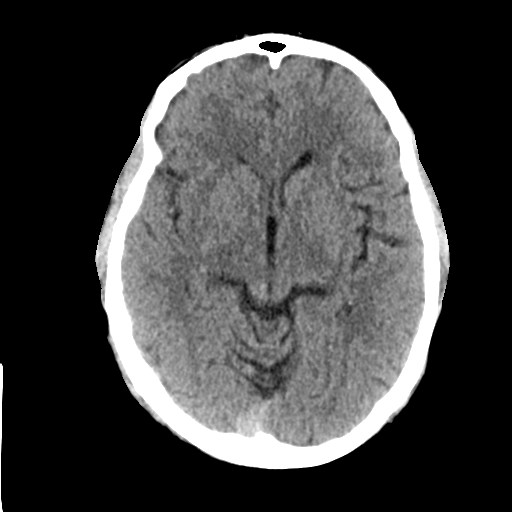
[im 14/31  bone]
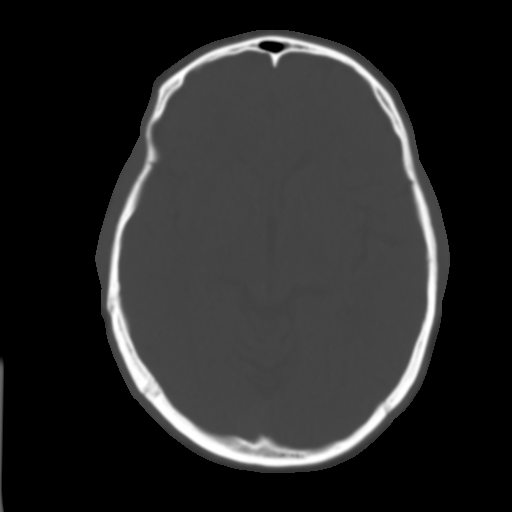
[im 17/31  brain]
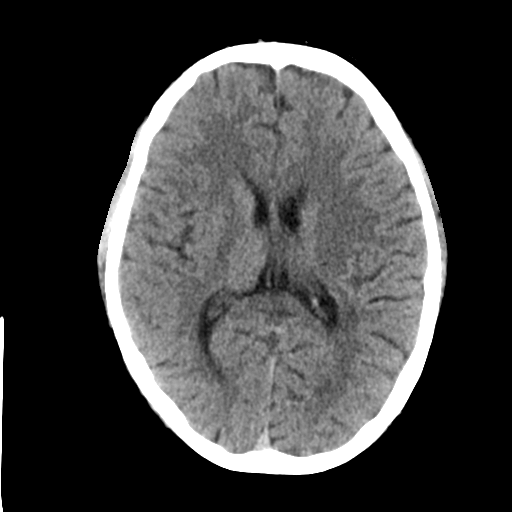
[im 20/31  brain]
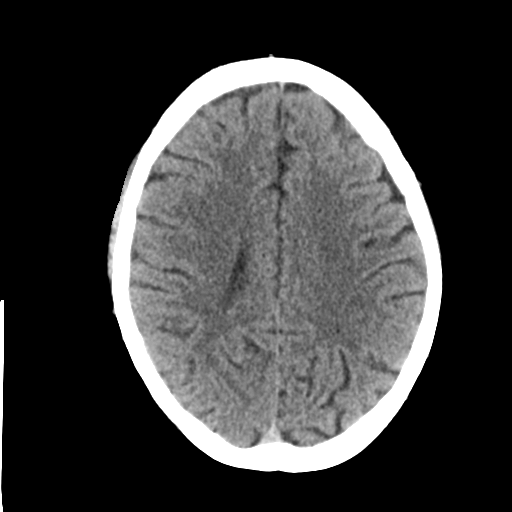
[im 23/31  brain]
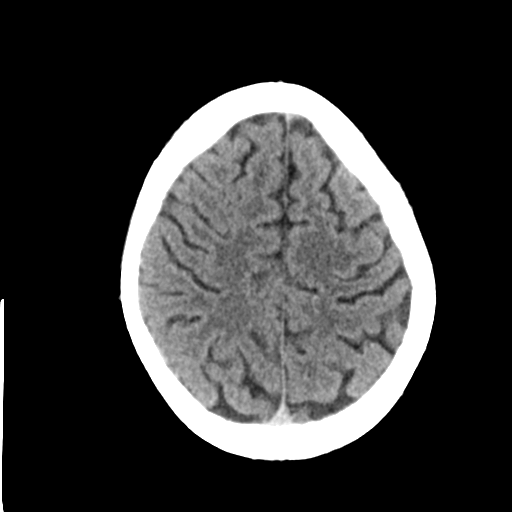
[im 25/31  brain]
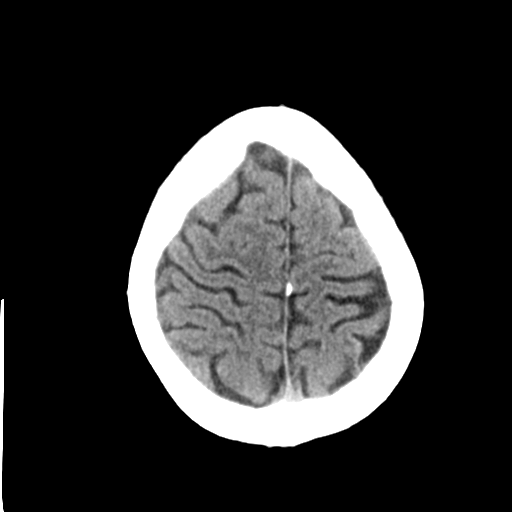
[im 25/31  bone]
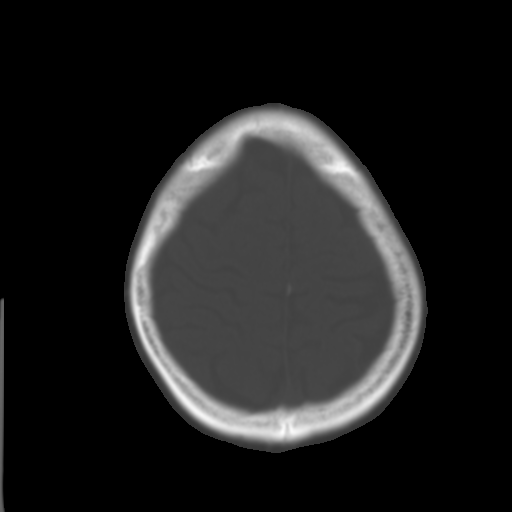
[im 28/31  brain]
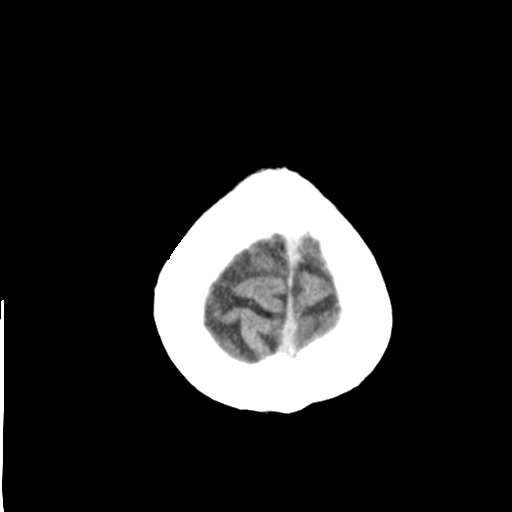

[Series 5: head 3.0 mpr cor · coronal · 0.33mm/px · 3 of 67 slices shown]
[im 23/67  brain]
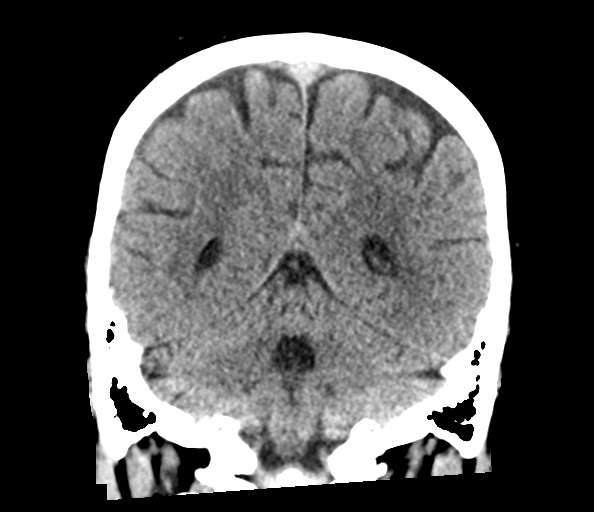
[im 30/67  brain]
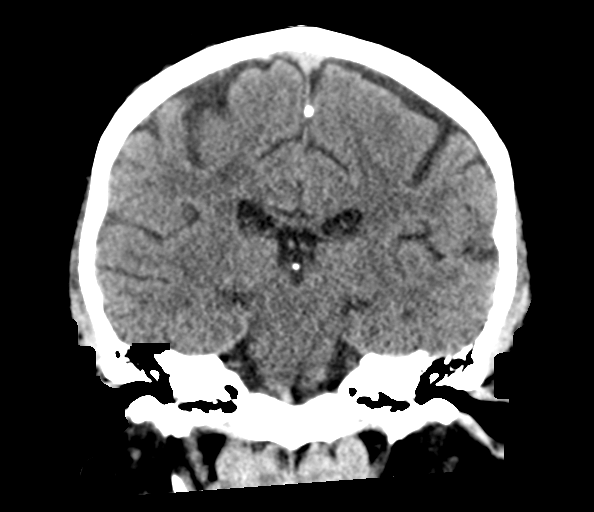
[im 37/67  brain]
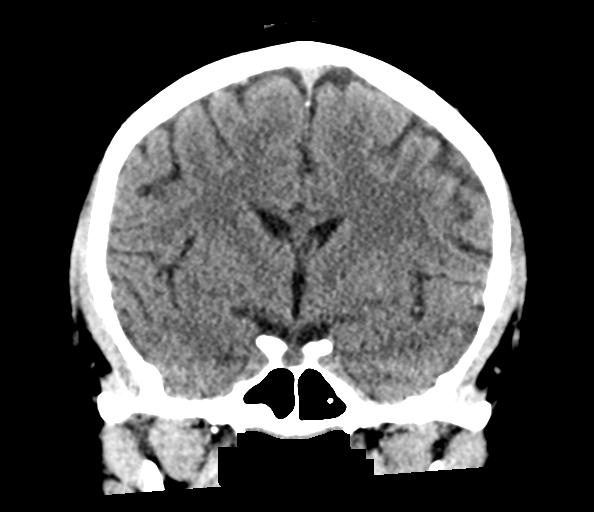

[Series 6: head 3.0 mpr sag · sagittal · 0.33mm/px · 3 of 58 slices shown]
[im 20/58  brain]
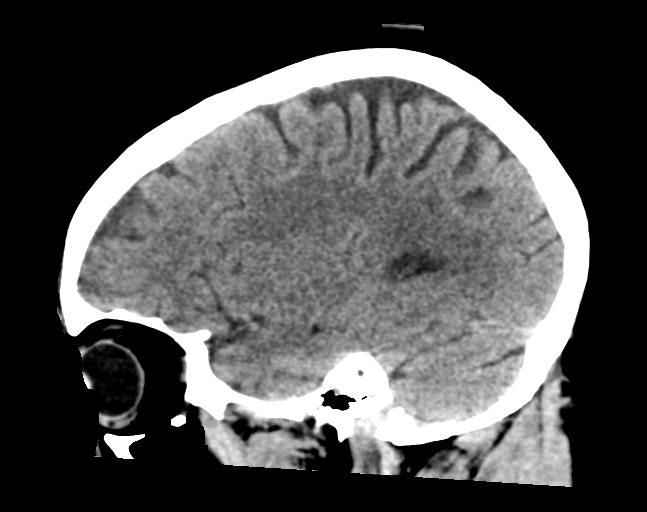
[im 29/58  brain]
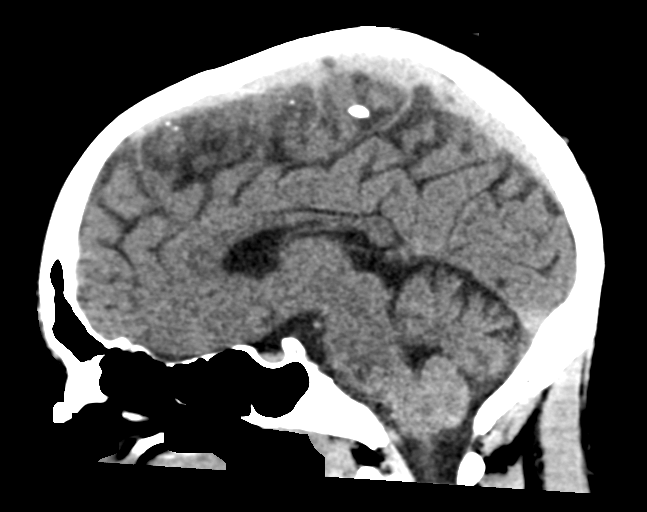
[im 39/58  brain]
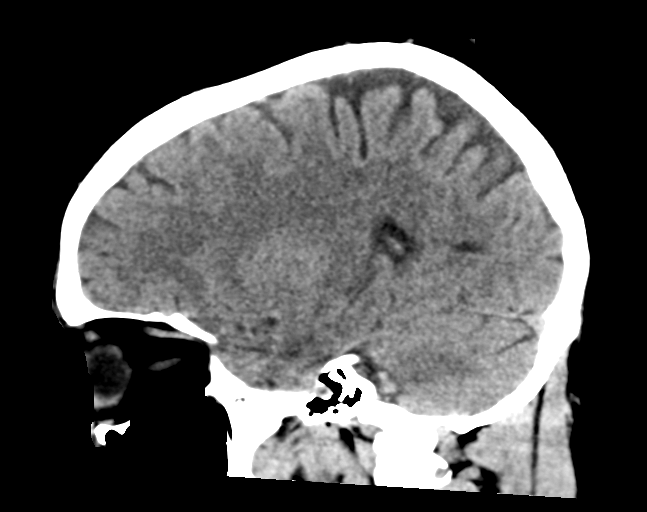

[16 of 47 positions shown; findings below may reference images not displayed]

FINDINGS: Brain: No evidence of acute infarction, hemorrhage, hydrocephalus,
extra-axial collection or mass lesion/mass effect.

Vascular: No hyperdense vessel or unexpected calcification.

Skull: Normal. Negative for fracture or focal lesion.

Sinuses/Orbits: No acute finding.

Other: None.
IMPRESSION: Normal head CT.

## 2017-05-12 NOTE — ED Triage Notes (Signed)
Pt sent from PCP for numbness, dizziness, and nausea. Pt c/o "feeling like I'm under water but it's only in my R ear." Also has decreased sensation to R side of face, no other neuro deficits. Symptoms onset after waking up this morning

## 2017-05-12 NOTE — ED Notes (Signed)
Spoke to Dr.Mackuen for orders; verbal order obtained for CT head

## 2017-05-13 ENCOUNTER — Emergency Department (HOSPITAL_COMMUNITY)
Admission: EM | Admit: 2017-05-13 | Discharge: 2017-05-13 | Disposition: A | Discharge status: Home / Self Care | Payer: 59 | Attending: Emergency Medicine | Admitting: Emergency Medicine

## 2017-05-13 NOTE — ED Notes (Signed)
Pt called for vitals recheck, no answer.  

## 2017-06-17 DIAGNOSIS — Z23 Encounter for immunization: Secondary | ICD-10-CM | POA: Diagnosis not present

## 2017-06-17 DIAGNOSIS — Z0000 Encounter for general adult medical examination without abnormal findings: Secondary | ICD-10-CM | POA: Diagnosis not present

## 2017-06-17 DIAGNOSIS — Z13220 Encounter for screening for lipoid disorders: Secondary | ICD-10-CM | POA: Diagnosis not present

## 2017-08-14 DIAGNOSIS — J020 Streptococcal pharyngitis: Secondary | ICD-10-CM | POA: Diagnosis not present

## 2018-01-01 DIAGNOSIS — R030 Elevated blood-pressure reading, without diagnosis of hypertension: Secondary | ICD-10-CM | POA: Diagnosis not present

## 2018-07-09 DIAGNOSIS — Z0000 Encounter for general adult medical examination without abnormal findings: Secondary | ICD-10-CM | POA: Diagnosis not present

## 2018-07-09 DIAGNOSIS — Z13220 Encounter for screening for lipoid disorders: Secondary | ICD-10-CM | POA: Diagnosis not present

## 2019-11-24 ENCOUNTER — Inpatient Hospital Stay (HOSPITAL_BASED_OUTPATIENT_CLINIC_OR_DEPARTMENT_OTHER)
Admission: EM | Admit: 2019-11-24 | Discharge: 2019-11-27 | DRG: 566 | Disposition: A | Discharge status: Home / Self Care | Payer: 59 | Attending: Internal Medicine | Admitting: Internal Medicine

## 2019-11-24 ENCOUNTER — Other Ambulatory Visit: Payer: Self-pay

## 2019-11-24 ENCOUNTER — Encounter (HOSPITAL_BASED_OUTPATIENT_CLINIC_OR_DEPARTMENT_OTHER): Payer: Self-pay | Admitting: *Deleted

## 2019-11-24 DIAGNOSIS — Z8249 Family history of ischemic heart disease and other diseases of the circulatory system: Secondary | ICD-10-CM

## 2019-11-24 DIAGNOSIS — Z833 Family history of diabetes mellitus: Secondary | ICD-10-CM

## 2019-11-24 DIAGNOSIS — Z79899 Other long term (current) drug therapy: Secondary | ICD-10-CM | POA: Diagnosis not present

## 2019-11-24 DIAGNOSIS — Z20822 Contact with and (suspected) exposure to covid-19: Secondary | ICD-10-CM | POA: Diagnosis present

## 2019-11-24 DIAGNOSIS — G629 Polyneuropathy, unspecified: Secondary | ICD-10-CM | POA: Diagnosis present

## 2019-11-24 DIAGNOSIS — M6282 Rhabdomyolysis: Secondary | ICD-10-CM

## 2019-11-24 DIAGNOSIS — T796XXA Traumatic ischemia of muscle, initial encounter: Secondary | ICD-10-CM | POA: Diagnosis not present

## 2019-11-24 DIAGNOSIS — X500XXA Overexertion from strenuous movement or load, initial encounter: Secondary | ICD-10-CM

## 2019-11-24 DIAGNOSIS — F17210 Nicotine dependence, cigarettes, uncomplicated: Secondary | ICD-10-CM | POA: Diagnosis present

## 2019-11-24 DIAGNOSIS — Y93B3 Activity, free weights: Secondary | ICD-10-CM | POA: Diagnosis not present

## 2019-11-24 DIAGNOSIS — R7401 Elevation of levels of liver transaminase levels: Secondary | ICD-10-CM

## 2019-11-24 HISTORY — DX: Rhabdomyolysis: M62.82

## 2019-11-24 LAB — COMPREHENSIVE METABOLIC PANEL
ALT: 501 U/L — ABNORMAL HIGH (ref 0–44)
AST: 1874 U/L — ABNORMAL HIGH (ref 15–41)
Albumin: 3.9 g/dL (ref 3.5–5.0)
Alkaline Phosphatase: 85 U/L (ref 38–126)
Anion gap: 10 (ref 5–15)
BUN: 12 mg/dL (ref 6–20)
CO2: 27 mmol/L (ref 22–32)
Calcium: 9.2 mg/dL (ref 8.9–10.3)
Chloride: 101 mmol/L (ref 98–111)
Creatinine, Ser: 1.18 mg/dL (ref 0.61–1.24)
GFR calc Af Amer: >60 mL/min (ref >60)
GFR calc non Af Amer: >60 mL/min (ref >60)
Glucose, Bld: 97 mg/dL (ref 70–99)
Potassium: 4.2 mmol/L (ref 3.5–5.1)
Sodium: 138 mmol/L (ref 135–145)
Total Bilirubin: 0.7 mg/dL (ref 0.3–1.2)
Total Protein: 8 g/dL (ref 6.5–8.1)

## 2019-11-24 LAB — CBC WITH DIFFERENTIAL/PLATELET
Abs Immature Granulocytes: 0.04 10*3/uL (ref 0.00–0.07)
Basophils Absolute: 0.1 10*3/uL (ref 0.0–0.1)
Basophils Relative: 1 %
Eosinophils Absolute: 0.6 10*3/uL — ABNORMAL HIGH (ref 0.0–0.5)
Eosinophils Relative: 6 %
HCT: 48 % (ref 39.0–52.0)
Hemoglobin: 15.9 g/dL (ref 13.0–17.0)
Immature Granulocytes: 0 %
Lymphocytes Relative: 44 %
Lymphs Abs: 4.5 10*3/uL — ABNORMAL HIGH (ref 0.7–4.0)
MCH: 28.9 pg (ref 26.0–34.0)
MCHC: 33.1 g/dL (ref 30.0–36.0)
MCV: 87.3 fL (ref 80.0–100.0)
Monocytes Absolute: 0.9 10*3/uL (ref 0.1–1.0)
Monocytes Relative: 9 %
Neutro Abs: 4.1 10*3/uL (ref 1.7–7.7)
Neutrophils Relative %: 40 %
Platelets: 372 10*3/uL (ref 150–400)
RBC: 5.5 MIL/uL (ref 4.22–5.81)
RDW: 15.5 % (ref 11.5–15.5)
WBC: 10.2 10*3/uL (ref 4.0–10.5)
nRBC: 0 % (ref 0.0–0.2)

## 2019-11-24 LAB — URINALYSIS, MICROSCOPIC (REFLEX): WBC, UA: NONE SEEN WBC/hpf (ref 0–5)

## 2019-11-24 LAB — POCT I-STAT EG7
Acid-base deficit: 1 mmol/L (ref 0.0–2.0)
Bicarbonate: 23.9 mmol/L (ref 20.0–28.0)
Calcium, Ion: 1.17 mmol/L (ref 1.15–1.40)
HCT: 44 % (ref 39.0–52.0)
Hemoglobin: 15 g/dL (ref 13.0–17.0)
O2 Saturation: 88 %
Patient temperature: 98.7
Potassium: 4.2 mmol/L (ref 3.5–5.1)
Sodium: 138 mmol/L (ref 135–145)
TCO2: 25 mmol/L (ref 22–32)
pCO2, Ven: 38.4 mmHg — ABNORMAL LOW (ref 44.0–60.0)
pH, Ven: 7.403 (ref 7.250–7.430)
pO2, Ven: 54 mmHg — ABNORMAL HIGH (ref 32.0–45.0)

## 2019-11-24 LAB — HEPATIC FUNCTION PANEL
ALT: 480 U/L — ABNORMAL HIGH (ref 0–44)
AST: 1862 U/L — ABNORMAL HIGH (ref 15–41)
Albumin: 4 g/dL (ref 3.5–5.0)
Alkaline Phosphatase: 83 U/L (ref 38–126)
Bilirubin, Direct: 0.1 mg/dL (ref 0.0–0.2)
Indirect Bilirubin: 0.7 mg/dL (ref 0.3–0.9)
Total Bilirubin: 0.8 mg/dL (ref 0.3–1.2)
Total Protein: 7.9 g/dL (ref 6.5–8.1)

## 2019-11-24 LAB — URINALYSIS, ROUTINE W REFLEX MICROSCOPIC
Glucose, UA: NEGATIVE mg/dL
Ketones, ur: NEGATIVE mg/dL
Leukocytes,Ua: NEGATIVE
Nitrite: NEGATIVE
Protein, ur: >300 mg/dL — AB
Specific Gravity, Urine: >1.03 — ABNORMAL HIGH (ref 1.005–1.030)
pH: 6 (ref 5.0–8.0)

## 2019-11-24 LAB — CK TOTAL AND CKMB (NOT AT ARMC)
CK, MB: 221.4 ng/mL — ABNORMAL HIGH (ref 0.5–5.0)
Total CK: >50000 U/L — ABNORMAL HIGH (ref 49–397)

## 2019-11-24 MED ORDER — SODIUM CHLORIDE 0.9 % IV BOLUS
1000.0000 mL | Freq: Once | INTRAVENOUS | Status: AC
  Administered 2019-11-24: 1000 mL via INTRAVENOUS

## 2019-11-24 MED ORDER — STERILE WATER FOR INJECTION IV SOLN
INTRAVENOUS | Status: DC
  Filled 2019-11-24: qty 850

## 2019-11-24 MED ORDER — SODIUM BICARBONATE 8.4 % IV SOLN
50.0000 meq | Freq: Once | INTRAVENOUS | Status: AC
  Administered 2019-11-24: 50 meq via INTRAVENOUS
  Filled 2019-11-24: qty 50

## 2019-11-24 NOTE — ED Triage Notes (Addendum)
States his urine is dark. It started after working out this week. His BP is 162/111.

## 2019-11-24 NOTE — ED Provider Notes (Signed)
[facility] EMERGENCY DEPARTMENT Provider Note   CSN: [id number] Arrival date & time: 11/24/19  1739     History Chief Complaint  Patient presents with  . Dark Urine    Cody Rodriguez is a 34 y.o. male who presents for evaluation of dark urine that began last night.  Patient states that when he used the restroom last night, he noticed his urine looked like a Coke.  He denies any dysuria or pain in his testicles or penis.  No overlying warmth, erythema.  He states that his bilateral upper extremities have been sore after a workout about 4 days ago.  He states that he recently started working out after 10 years and states that he did a strenuous arm workout.  He does report that he also worked out slightly yesterday.  Since then, he has had pain and swelling to his upper biceps bilaterally.  He states he has difficulty extending his arms fully secondary to pain.  He denies any fevers, chest pain, difficulty breathing, abdominal pain, nausea/vomiting.  The history is provided by the patient.       Past Medical History:  Diagnosis Date  . Paresthesia and pain of both upper extremities    also in right leg    Patient Active Problem List   Diagnosis Date Noted  . Rhabdomyolysis 11/24/2019  . Paresthesia and pain of both upper extremities   . Current smoker 07/02/2014  . Paresthesias 07/02/2014  . Weakness 07/02/2014  . Gynecomastia, male 07/02/2014    Past Surgical History:  Procedure Laterality Date  . FINGER SURGERY Left    left pinky  . ROTATOR CUFF REPAIR Left        Family History  Problem Relation Age of Onset  . Healthy Mother   . Hypertension Father   . Healthy Sister   . Healthy Brother   . Diabetes Maternal Aunt   . Neuropathy Neg Hx   . Multiple sclerosis Neg Hx     Social History   Tobacco Use  . Smoking status: Current Every Day Smoker    Packs/day: 0.50    Years: 10.00    Pack years: 5.00    Types: Cigarettes  . Smokeless tobacco:  Never Used  . Tobacco comment: 10/25/15 cut back a little  Substance Use Topics  . Alcohol use: Yes    Alcohol/week: 0.0 standard drinks    Comment: 1x month  . Drug use: No    Home Medications Prior to Admission medications   Medication Sig Start Date End Date Taking? Authorizing Provider  acetaminophen (TYLENOL) 500 MG tablet Take 500 mg by mouth every 6 (six) hours as needed.    [provider]  cyclobenzaprine (FLEXERIL) 10 MG tablet Take 1 tablet (10 mg total) by mouth 2 (two) times daily as needed for muscle spasms. 10/25/15   Anson Fret, MD  gabapentin (NEURONTIN) 300 MG capsule Take 2 capsules (600 mg total) by mouth 3 (three) times daily. Can take 2 before bedtime. 10/25/15   Anson Fret, MD  predniSONE (DELTASONE) 10 MG tablet Take 1 tablet (10 mg total) by mouth daily with breakfast. Began 10/16/15 10/25/15   Anson Fret, MD    Allergies    Patient has no known allergies.  Review of Systems   Review of Systems  Constitutional: Negative for fever.  Respiratory: Negative for cough and shortness of breath.   Cardiovascular: Negative for chest pain.  Gastrointestinal: Negative for abdominal pain, nausea and vomiting.  Genitourinary: Positive for hematuria. Negative for dysuria.  Musculoskeletal: Positive for myalgias.  Neurological: Negative for headaches.  All other systems reviewed and are negative.   Physical Exam Updated Vital Signs BP (!) 162/102   Pulse 100   Temp 98.1 F (36.7 C) (Oral)   Resp (!) 27   Ht 5\' 10"  (1.778 m)   Wt 117.9 kg   SpO2 100%   BMI 37.31 kg/m   Physical Exam Vitals and nursing note reviewed.  Constitutional:      Appearance: Normal appearance. He is well-developed.  HENT:     Head: Normocephalic and atraumatic.  Eyes:     General: Lids are normal.     Conjunctiva/sclera: Conjunctivae normal.     Pupils: Pupils are equal, round, and reactive to light.  Cardiovascular:     Rate and Rhythm: Normal rate and  regular rhythm.     Pulses: Normal pulses.          Radial pulses are 2+ on the right side and 2+ on the left side.     Heart sounds: Normal heart sounds. No murmur. No friction rub. No gallop.   Pulmonary:     Effort: Pulmonary effort is normal.     Breath sounds: Normal breath sounds.     Comments: Lungs clear to auscultation bilaterally.  Symmetric chest rise.  No wheezing, rales, rhonchi. Abdominal:     Palpations: Abdomen is soft. Abdomen is not rigid.     Tenderness: There is no abdominal tenderness. There is no guarding.  Musculoskeletal:        General: Normal range of motion.     Cervical back: Full passive range of motion without pain.     Comments: Bilateral upper arms are with mild soft tissue swelling, tenderness.  Compartments are soft.  Flexion of biceps intact but difficulty with extension secondary to pain.  No tenderness noted to bilateral forearms.  No tenderness, swelling noted bilateral lower extremities.  Skin:    General: Skin is warm and dry.     Capillary Refill: Capillary refill takes less than 2 seconds.  Neurological:     Mental Status: He is alert and oriented to person, place, and time.  Psychiatric:        Speech: Speech normal.     ED Results / Procedures / Treatments   Labs (all labs ordered are listed, but only abnormal results are displayed) Labs Reviewed  URINALYSIS, ROUTINE W REFLEX MICROSCOPIC - Abnormal; Notable for the following components:      Result Value   Color, Urine BROWN (*)    APPearance HAZY (*)    Specific Gravity, Urine >1.030 (*)    Hgb urine dipstick LARGE (*)    Bilirubin Urine SMALL (*)    Protein, ur >300 (*)    All other components within normal limits  COMPREHENSIVE METABOLIC PANEL - Abnormal; Notable for the following components:   AST 1,874 (*)    ALT 501 (*)    All other components within normal limits  CBC WITH DIFFERENTIAL/PLATELET - Abnormal; Notable for the following components:   Lymphs Abs 4.5 (*)     Eosinophils Absolute 0.6 (*)    All other components within normal limits  CK TOTAL AND CKMB (NOT AT [facility]) - Abnormal; Notable for the following components:   Total CK >50,000 (*)    CK, MB 221.4 (*)    All other components within normal limits  URINALYSIS, MICROSCOPIC (REFLEX) - Abnormal; Notable for the following components:  Bacteria, UA RARE (*)    All other components within normal limits  HEPATIC FUNCTION PANEL - Abnormal; Notable for the following components:   AST 1,862 (*)    ALT 480 (*)    All other components within normal limits  SARS CORONAVIRUS 2 (TAT 6-24 HRS)  HEPATITIS PANEL, ACUTE  I-STAT VENOUS BLOOD GAS, ED  POCT I-STAT EG7    EKG EKG Interpretation  Date/Time:  Thursday November 24 2019 18:40:53 EST Ventricular Rate:  102 PR Interval:    QRS Duration: 85 QT Interval:  350 QTC Calculation: 456 R Axis:   28 Text Interpretation: Sinus tachycardia Borderline T abnormalities, inferior leads No old tracing to compare Confirmed by Jacalyn Lefevre [phone]) on 11/24/2019 6:57:16 PM   Radiology No results found.  Procedures .Critical Care Performed by: Maxwell Caul, PA-C Authorized by: Maxwell Caul, PA-C   Critical care provider statement:    Critical care time (minutes):  45   Critical care was necessary to treat or prevent imminent or life-threatening deterioration of the following conditions:  Metabolic crisis   Critical care was time spent personally by me on the following activities:  Discussions with consultants, evaluation of patient's response to treatment, examination of patient, ordering and performing treatments and interventions, ordering and review of laboratory studies, ordering and review of radiographic studies, pulse oximetry, re-evaluation of patient's condition, obtaining history from patient or surrogate and review of old charts   (including critical care time)  Medications Ordered in ED Medications  sodium bicarbonate injection  50 mEq (has no administration in time range)  sodium chloride 0.9 % bolus 1,000 mL (has no administration in time range)  sodium chloride 0.9 % bolus 1,000 mL (0 mLs Intravenous Stopped 11/24/19 1948)  sodium chloride 0.9 % bolus 1,000 mL (0 mLs Intravenous Stopped 11/24/19 2052)    ED Course  I have reviewed the triage vital signs and the nursing notes.  Pertinent labs & imaging results that were available during my care of the patient were reviewed by me and considered in my medical decision making (see chart for details).    MDM Rules/Calculators/A&P                      34 year old male who presents for evaluation of dark urine.  Also endorses bilateral upper extremity pain, swelling since a workout about 4 days ago.  No chest pain, difficulty breathing.  On initially arrival, he is afebrile, nontoxic-appearing.  Vital signs are stable.  On exam, he has swelling, tenderness noted to bilateral upper arms.  Compartments are soft.  He is neurovascularly intact.  Concern for rhabdomyolysis.  Plan to check labs.  CBC shows no leukocytosis or anemia.  UA shows large hemoglobin but no red blood cells.  CMP shows normal BUN and creatinine but LFTs are elevated with AST at 1874 and ALT at 501.  Patient with no previous history of transaminitis. No recent travel or hepatitis risk factors.  CK elevated greater than 50,000.  Additional liter fluids ordered.  At this time, given rhabdomyolysis, will plan for admission.  Discussed patient with Dr. Dalene Carrow (hospitalist). Will accept patient for admission.   Portions of this note were generated with Scientist, clinical (histocompatibility and immunogenetics). Dictation errors may occur despite best attempts at proofreading.   Final Clinical Impression(s) / ED Diagnoses Final diagnoses:  Traumatic rhabdomyolysis, initial encounter (HCC)  Transaminitis    Rx / DC Orders ED Discharge Orders    None  Volanda Napoleon, PA-C 11/24/19 2144    Isla Pence, MD 11/24/19  2219

## 2019-11-24 NOTE — Progress Notes (Addendum)
Patient: Cody Rodriguez, Cody Rodriguez (DOB 05-30-1986)  I discussed the following case with Providence Lanius, PA from [facility] ED, who contacted the call-center requesting transfer of the above patient to [facility] for further work-up and management of rhabdomyolysis, with necessity for transfer on the basis of need for higher level of care and availability of specialist providers.    Cody Rodriguez, Cody Rodriguez is a 34 year old with no significant medical history, who presented to [facility] ED on 11/24/19 complaining of appearance of dark-colored urine starting 1 day ago. Reports associated myalgias in the b/l UE's after lifting weights for the first time in multiple years on Monday, 11/21/19. Reports associated weakness in the extensors of the b/l UE's, but no lower extremity involvement. Otherwise, the patient denies any recent trauma.   Vital signs in the ED were notable for the following: Temp 98.1; HR 95-109; BP: 146/88 - 162/102; RR 18 -27; O2 sats 98-100% on RA.   Labs were notable for CPK > 50,000; UA notable for large hemoglobin with no rbc's. Creatinine 1.18. Alk phos 85; AST 1874, ALT 501; T bili 0.8. COVID-19 PCR currently pending.  In the process of receiving 2 L IV NS bolus, with plan for additional IVF in transfer.    Subsequently, I accepted this patient for transfer to a med-tele bed at [facility] for further work-up and management of rhabdomyolysis, with reason/necessity for transfer as described above.     Babs Bertin, DO Hospitalist

## 2019-11-25 ENCOUNTER — Encounter (HOSPITAL_COMMUNITY): Payer: Self-pay | Admitting: Internal Medicine

## 2019-11-25 DIAGNOSIS — R7401 Elevation of levels of liver transaminase levels: Secondary | ICD-10-CM

## 2019-11-25 DIAGNOSIS — M6282 Rhabdomyolysis: Secondary | ICD-10-CM

## 2019-11-25 LAB — HEPATIC FUNCTION PANEL
ALT: 449 U/L — ABNORMAL HIGH (ref 0–44)
AST: 1631 U/L — ABNORMAL HIGH (ref 15–41)
Albumin: 3.2 g/dL — ABNORMAL LOW (ref 3.5–5.0)
Alkaline Phosphatase: 65 U/L (ref 38–126)
Bilirubin, Direct: 0.1 mg/dL (ref 0.0–0.2)
Indirect Bilirubin: 0.7 mg/dL (ref 0.3–0.9)
Total Bilirubin: 0.8 mg/dL (ref 0.3–1.2)
Total Protein: 6.4 g/dL — ABNORMAL LOW (ref 6.5–8.1)

## 2019-11-25 LAB — BASIC METABOLIC PANEL
Anion gap: 7 (ref 5–15)
BUN: 9 mg/dL (ref 6–20)
CO2: 25 mmol/L (ref 22–32)
Calcium: 8.4 mg/dL — ABNORMAL LOW (ref 8.9–10.3)
Chloride: 108 mmol/L (ref 98–111)
Creatinine, Ser: 1.12 mg/dL (ref 0.61–1.24)
GFR calc Af Amer: >60 mL/min (ref >60)
GFR calc non Af Amer: >60 mL/min (ref >60)
Glucose, Bld: 100 mg/dL — ABNORMAL HIGH (ref 70–99)
Potassium: 4.3 mmol/L (ref 3.5–5.1)
Sodium: 140 mmol/L (ref 135–145)

## 2019-11-25 LAB — CBC WITH DIFFERENTIAL/PLATELET
Abs Immature Granulocytes: 0.04 10*3/uL (ref 0.00–0.07)
Basophils Absolute: 0.1 10*3/uL (ref 0.0–0.1)
Basophils Relative: 1 %
Eosinophils Absolute: 0.5 10*3/uL (ref 0.0–0.5)
Eosinophils Relative: 5 %
HCT: 43.6 % (ref 39.0–52.0)
Hemoglobin: 14.6 g/dL (ref 13.0–17.0)
Immature Granulocytes: 0 %
Lymphocytes Relative: 33 %
Lymphs Abs: 3.3 10*3/uL (ref 0.7–4.0)
MCH: 29.4 pg (ref 26.0–34.0)
MCHC: 33.5 g/dL (ref 30.0–36.0)
MCV: 87.7 fL (ref 80.0–100.0)
Monocytes Absolute: 0.9 10*3/uL (ref 0.1–1.0)
Monocytes Relative: 9 %
Neutro Abs: 5.2 10*3/uL (ref 1.7–7.7)
Neutrophils Relative %: 52 %
Platelets: 318 10*3/uL (ref 150–400)
RBC: 4.97 MIL/uL (ref 4.22–5.81)
RDW: 15.4 % (ref 11.5–15.5)
WBC: 10 10*3/uL (ref 4.0–10.5)
nRBC: 0 % (ref 0.0–0.2)

## 2019-11-25 LAB — ACETAMINOPHEN LEVEL: Acetaminophen (Tylenol), Serum: <10 ug/mL — ABNORMAL LOW (ref 10–30)

## 2019-11-25 LAB — HEPATITIS PANEL, ACUTE
HCV Ab: NONREACTIVE
Hep A IgM: NONREACTIVE
Hep B C IgM: NONREACTIVE
Hepatitis B Surface Ag: NONREACTIVE

## 2019-11-25 LAB — PROTIME-INR
INR: 1 (ref 0.8–1.2)
Prothrombin Time: 12.8 seconds (ref 11.4–15.2)

## 2019-11-25 LAB — CK
Total CK: >50000 U/L — ABNORMAL HIGH (ref 49–397)
Total CK: >50000 U/L — ABNORMAL HIGH (ref 49–397)

## 2019-11-25 LAB — RAPID URINE DRUG SCREEN, HOSP PERFORMED
Amphetamines: NOT DETECTED
Barbiturates: NOT DETECTED
Benzodiazepines: NOT DETECTED
Cocaine: NOT DETECTED
Opiates: NOT DETECTED
Tetrahydrocannabinol: NOT DETECTED

## 2019-11-25 LAB — HIV ANTIBODY (ROUTINE TESTING W REFLEX): HIV Screen 4th Generation wRfx: NONREACTIVE

## 2019-11-25 LAB — TROPONIN I (HIGH SENSITIVITY): Troponin I (High Sensitivity): 17 ng/L (ref <18)

## 2019-11-25 LAB — SARS CORONAVIRUS 2 (TAT 6-24 HRS): SARS Coronavirus 2: NEGATIVE

## 2019-11-25 MED ORDER — SODIUM CHLORIDE 0.9 % IV SOLN: INTRAVENOUS | Status: DC

## 2019-11-25 MED ORDER — HYDRALAZINE HCL 20 MG/ML IJ SOLN
10.0000 mg | INTRAMUSCULAR | Status: DC | PRN
  Administered 2019-11-25 – 2019-11-26 (×4): 10 mg via INTRAVENOUS
  Filled 2019-11-25 (×5): qty 1

## 2019-11-25 MED ORDER — LACTATED RINGERS IV BOLUS
1000.0000 mL | Freq: Once | INTRAVENOUS | Status: AC
  Administered 2019-11-25: 1000 mL via INTRAVENOUS

## 2019-11-25 MED ORDER — TRAZODONE HCL 50 MG PO TABS
50.0000 mg | ORAL_TABLET | Freq: Once | ORAL | Status: AC
  Administered 2019-11-25: 50 mg via ORAL
  Filled 2019-11-25: qty 1

## 2019-11-25 MED ORDER — ACETAMINOPHEN 325 MG PO TABS
650.0000 mg | ORAL_TABLET | Freq: Four times a day (QID) | ORAL | Status: DC | PRN
  Administered 2019-11-25 (×2): 650 mg via ORAL
  Filled 2019-11-25 (×2): qty 2

## 2019-11-25 NOTE — Progress Notes (Signed)
Patient seen and examined personally, I reviewed the chart, history and physical and admission note, done by admitting physician this morning and agree with the same with following addendum.  Please refer to the morning admission note for more detailed plan of care.  Briefly,  42 YOM with history of neuropathy presents to ER with dark urine, stiffness of upper extremity following heavy lifting/exercise that was started 4 days PTA. In the ER, Labs show AST 1800 and ALT 1800 alkaline phosphatase was normal bilirubin was normal.  CK level was more than 50,000.  MB was 221.  CBC was unremarkable Covid test is pending.  Patient was given 2 L normal saline bolus admitted for rhabdomyolysis.  On exam, he has Tenderness in the proximal upper extremities, resting comfortably in the bedside chair.  No nausea vomiting. Lung exams are clear.  Reports he is voiding well and urine is getting lighter.  Acute severe rhabdomyolysis secondary to post exercise: CK >5000., HCO3 reassuring at 25, bun/creat stable 9/1.12. continue  Aggressive IV fluid hydration 20 mL/h normal saline.  Repeat CK level every 12 hours starting today evening.  Transaminitis: suspect due to #1. Acute hepatitis panel negative.  AST/ALT down to 1631 and 449, improving, total bili and ALP normal.  Encourage oral hydration.

## 2019-11-25 NOTE — H&P (Signed)
History and Physical    Cody Rodriguez TIW:[id number] DOB: 1986/09/04 DOA: 11/24/2019  PCP: Glenford Bayley, DO  Patient coming from: Home.  Chief Complaint: Dark urine.  HPI: Cody Rodriguez is a 34 y.o. male with previous history of neuropathy followed by neurologist presents to the ER after patient had developing dark urine after starting exercise after a long time.  Patient started doing lifting weights 4 days ago.  And started developing stiffness of upper extremities.  Denies any weakness of extremities denies any shortness of breath or loss of function of extremities.  Denies any hypoglycemic episodes.  Denies any previous episodes of rhabdomyolysis.  Took some ibuprofen denies taking any Tylenol.  Denies any fever chills.  Denies using any drugs.  ED Course: In the ER on exam patient appears nonfocal.  Labs show AST 1800 and ALT 1800 alkaline phosphatase was normal bilirubin was normal.  CK level was more than 50,000.  MB was 221.  CBC was unremarkable Covid test is pending.  Patient was given 2 L normal saline bolus admitted for rhabdomyolysis.  On my exam patient has good sensation upper extremity and no restriction of the movement of all extremities.  Urine shows ketones with large amount of hemoglobin with no RBCs.  EKG shows normal sinus rhythm with nonspecific inferior T wave changes.  Review of Systems: As per HPI, rest all negative.   Past Medical History:  Diagnosis Date  . Paresthesia and pain of both upper extremities    also in right leg    Past Surgical History:  Procedure Laterality Date  . FINGER SURGERY Left    left pinky  . ROTATOR CUFF REPAIR Left      reports that he has been smoking cigarettes. He has a 5.00 pack-year smoking history. He has never used smokeless tobacco. He reports current alcohol use. He reports that he does not use drugs.  No Known Allergies  Family History  Problem Relation Age of Onset  . Healthy Mother   . Hypertension Father   .  Healthy Sister   . Healthy Brother   . Diabetes Maternal Aunt   . Neuropathy Neg Hx   . Multiple sclerosis Neg Hx     Prior to Admission medications   Medication Sig Start Date End Date Taking? Authorizing Provider  acetaminophen (TYLENOL) 500 MG tablet Take 500 mg by mouth every 6 (six) hours as needed.    [provider]  cyclobenzaprine (FLEXERIL) 10 MG tablet Take 1 tablet (10 mg total) by mouth 2 (two) times daily as needed for muscle spasms. 10/25/15   Melvenia Beam, MD  gabapentin (NEURONTIN) 300 MG capsule Take 2 capsules (600 mg total) by mouth 3 (three) times daily. Can take 2 before bedtime. 10/25/15   Melvenia Beam, MD  predniSONE (DELTASONE) 10 MG tablet Take 1 tablet (10 mg total) by mouth daily with breakfast. Began 10/16/15 10/25/15   Melvenia Beam, MD    Physical Exam: Constitutional: Moderately built and nourished. Vitals:   11/24/19 2100 11/24/19 2130 11/24/19 2152 11/24/19 2310  BP: (!) 162/102 (!) 168/93  (!) 165/114  Pulse: 100 (!) 109  (!) 105  Resp: (!) 27 (!) 22  20  Temp:   97.8 F (36.6 C) 98.2 F (36.8 C)  TempSrc:   Oral Oral  SpO2: 100% 99%  95%  Weight:      Height:       Eyes: Anicteric no pallor. ENMT: No discharge from the ears eyes  nose or mouth. Neck: No mass or.  No neck rigidity. Respiratory: No rhonchi or crepitations. Cardiovascular: S1-S2 heard. Abdomen: Soft nontender bowel sound present. Musculoskeletal: No obvious edema or swelling or muscle tenderness on exam of the extremities. Skin: No rash. Neurologic: Alert awake oriented time place and person.  Moves all extremities. Psychiatric: Appears normal per normal affect.   Labs on Admission: I have personally reviewed following labs and imaging studies  CBC: Recent Labs  Lab 11/24/19 1824 11/24/19 2142  WBC 10.2  --   NEUTROABS 4.1  --   HGB 15.9 15.0  HCT 48.0 44.0  MCV 87.3  --   PLT 372  --    Basic Metabolic Panel: Recent Labs  Lab 11/24/19 1824  11/24/19 2142  NA 138 138  K 4.2 4.2  CL 101  --   CO2 27  --   GLUCOSE 97  --   BUN 12  --   CREATININE 1.18  --   CALCIUM 9.2  --    GFR: Estimated Creatinine Clearance: 113.5 mL/min (by C-G formula based on SCr of 1.18 mg/dL). Liver Function Tests: Recent Labs  Lab 11/24/19 1824  AST 1,862*  1,874*  ALT 480*  501*  ALKPHOS 83  85  BILITOT 0.8  0.7  PROT 7.9  8.0  ALBUMIN 4.0  3.9   No results for input(s): LIPASE, AMYLASE in the last 168 hours. No results for input(s): AMMONIA in the last 168 hours. Coagulation Profile: No results for input(s): INR, PROTIME in the last 168 hours. Cardiac Enzymes: Recent Labs  Lab 11/24/19 1824  CKTOTAL >50,000*  CKMB 221.4*   BNP (last 3 results) No results for input(s): PROBNP in the last 8760 hours. HbA1C: No results for input(s): HGBA1C in the last 72 hours. CBG: No results for input(s): GLUCAP in the last 168 hours. Lipid Profile: No results for input(s): CHOL, HDL, LDLCALC, TRIG, CHOLHDL, LDLDIRECT in the last 72 hours. Thyroid Function Tests: No results for input(s): TSH, T4TOTAL, FREET4, T3FREE, THYROIDAB in the last 72 hours. Anemia Panel: No results for input(s): VITAMINB12, FOLATE, FERRITIN, TIBC, IRON, RETICCTPCT in the last 72 hours. Urine analysis:    Component Value Date/Time   COLORURINE BROWN (A) 11/24/2019 1802   APPEARANCEUR HAZY (A) 11/24/2019 1802   LABSPEC >1.030 (H) 11/24/2019 1802   PHURINE 6.0 11/24/2019 1802   GLUCOSEU NEGATIVE 11/24/2019 1802   HGBUR LARGE (A) 11/24/2019 1802   BILIRUBINUR SMALL (A) 11/24/2019 1802   KETONESUR NEGATIVE 11/24/2019 1802   PROTEINUR >300 (A) 11/24/2019 1802   NITRITE NEGATIVE 11/24/2019 1802   LEUKOCYTESUR NEGATIVE 11/24/2019 1802   Sepsis Labs: @LABRCNTIP (procalcitonin:4,lacticidven:4) )No results found for this or any previous visit (from the past 240 hour(s)).   Radiological Exams on Admission: No results found.  EKG: Independently reviewed.   Normal sinus rhythm with nonspecific ST changes in the inferior leads.  Assessment/Plan Principal Problem:   Rhabdomyolysis Active Problems:   Transaminitis    1. Acute rhabdomyolysis -likely precipitated by exercise.  Patient's creatinine is normal urine does show some ketones patient does not have any hypoglycemic episodes denies any shortness of breath and no signs of any heart failure.  We will continue to hydrate follow CK levels and metabolic panel closely.  Check carnitine levels. 2. Elevated LFTs likely from rhabdomyolysis.  Denies taking any Tylenol.  Follow LFTs closely.  Check INR. 3. Tobacco abuse -advised about quitting.  Given that patient has significantly elevated CK levels will need close monitoring for  any deterioration and will need inpatient status.  Covid test is pending.   DVT prophylaxis: We will keep patient on SCDs for now until LFTs improve. Code Status: Full code. Family Communication: Discussed with patient. Disposition Plan: Home. Consults called: None. Admission status: Inpatient.   Eduard Clos MD [facility] Pager [phone].  If 7PM-7AM, please contact night-coverage [email or web address] Password [facility]  11/25/2019, 12:13 AM   '

## 2019-11-26 LAB — COMPREHENSIVE METABOLIC PANEL
ALT: 506 U/L — ABNORMAL HIGH (ref 0–44)
AST: 1635 U/L — ABNORMAL HIGH (ref 15–41)
Albumin: 3.3 g/dL — ABNORMAL LOW (ref 3.5–5.0)
Alkaline Phosphatase: 61 U/L (ref 38–126)
Anion gap: 9 (ref 5–15)
BUN: 7 mg/dL (ref 6–20)
CO2: 24 mmol/L (ref 22–32)
Calcium: 8.7 mg/dL — ABNORMAL LOW (ref 8.9–10.3)
Chloride: 106 mmol/L (ref 98–111)
Creatinine, Ser: 0.87 mg/dL (ref 0.61–1.24)
GFR calc Af Amer: >60 mL/min (ref >60)
GFR calc non Af Amer: >60 mL/min (ref >60)
Glucose, Bld: 95 mg/dL (ref 70–99)
Potassium: 4 mmol/L (ref 3.5–5.1)
Sodium: 139 mmol/L (ref 135–145)
Total Bilirubin: 0.6 mg/dL (ref 0.3–1.2)
Total Protein: 6.6 g/dL (ref 6.5–8.1)

## 2019-11-26 LAB — CK
Total CK: >50000 U/L — ABNORMAL HIGH (ref 49–397)
Total CK: >50000 U/L — ABNORMAL HIGH (ref 49–397)

## 2019-11-26 MED ORDER — TRAZODONE HCL 50 MG PO TABS
50.0000 mg | ORAL_TABLET | Freq: Every evening | ORAL | Status: DC | PRN
  Administered 2019-11-26: 50 mg via ORAL
  Filled 2019-11-26: qty 1

## 2019-11-26 NOTE — Progress Notes (Signed)
BP uncontrolled overnight, Hydralazine given twice and still iineffective No c/o from pt. Mother asked will patient need another BP medicine.Will continue to monitor.

## 2019-11-26 NOTE — Progress Notes (Signed)
PROGRESS NOTE    Cody Rodriguez  ZOX:[id number] DOB: [birth year] DOA: 11/24/2019 PCP: Glenford Bayley, DO  Outpatient Specialists:   Brief Narrative:  Patient is a 34 year old African-American male with past medical history significant for neuropathy.  Patient was admitted with rhabdomyolysis.  On admission, CPK was greater than 50,000.  Renal function has remained normal.  Apparently, patient has started lifting weights 4 days prior to onset of symptoms.  11/26/2019: Patient seen alongside patient's mother.  No new symptoms.  No muscle aches.  Patient is tolerating IV fluids at the rate of 200 cc an hour.  Renal function has remained within normal range.  CPK still greater than 50,000.  We will continue current management for now.  Continue to monitor CPK and renal function closely.  Assessment & Plan:   Principal Problem:   Rhabdomyolysis Active Problems:   Transaminitis  Rhabdomyolysis: -Continue IV fluids -Continue to monitor renal function and electrolytes -Continue to monitor CPK -Further management will depend on hospital course.  Elevated LFTs: -Likely secondary to rhabdomyolysis.  Tobacco use: -Counseled.  DVT prophylaxis: SCD Code Status: Full code Family Communication: Mother. Disposition Plan: Home eventually   Consultants:   None  Procedures:   None  Antimicrobials:   None   Subjective: No complaints No muscle ache   Objective: Vitals:   11/25/19 2231 11/26/19 0500 11/26/19 0514 11/26/19 0603  BP: (!) 171/96  (!) 171/101 (!) 155/93  Pulse: (!) 105  (!) 105 (!) 105  Resp:   16   Temp:   98.3 F (36.8 C)   TempSrc:      SpO2:   93%   Weight:  114.6 kg    Height:        Intake/Output Summary (Last 24 hours) at 11/26/2019 1141 Last data filed at 11/26/2019 0900 Gross per 24 hour  Intake 2466.26 ml  Output 5275 ml  Net -2808.74 ml   Filed Weights   11/24/19 1756 11/25/19 0535 11/26/19 0500  Weight: 117.9 kg 114.5 kg 114.6 kg     Examination:  General exam: Appears calm and comfortable  Respiratory system: Clear to auscultation. Respiratory effort normal. Cardiovascular system: S1 & S2 heard. Gastrointestinal system: Abdomen is nondistended, soft and nontender. No organomegaly or masses felt. Normal bowel sounds heard. Central nervous system: Awake and alert.  Patient moves all extremities.   Extremities: No leg edema.  Minimal edema of the upper extremities (continue to monitor closely)  Data Reviewed: I have personally reviewed following labs and imaging studies  CBC: Recent Labs  Lab 11/24/19 1824 11/24/19 2142 11/25/19 0429  WBC 10.2  --  10.0  NEUTROABS 4.1  --  5.2  HGB 15.9 15.0 14.6  HCT 48.0 44.0 43.6  MCV 87.3  --  87.7  PLT 372  --  811   Basic Metabolic Panel: Recent Labs  Lab 11/24/19 1824 11/24/19 2142 11/25/19 0429 11/26/19 0528  NA 138 138 140 139  K 4.2 4.2 4.3 4.0  CL 101  --  108 106  CO2 27  --  25 24  GLUCOSE 97  --  100* 95  BUN 12  --  9 7  CREATININE 1.18  --  1.12 0.87  CALCIUM 9.2  --  8.4* 8.7*   GFR: Estimated Creatinine Clearance: 151.6 mL/min (by C-G formula based on SCr of 0.87 mg/dL). Liver Function Tests: Recent Labs  Lab 11/24/19 1824 11/25/19 0429 11/26/19 0528  AST 1,862*  1,874* 1,631* 1,635*  ALT 480*  501*  449* 506*  ALKPHOS 83  85 65 61  BILITOT 0.8  0.7 0.8 0.6  PROT 7.9  8.0 6.4* 6.6  ALBUMIN 4.0  3.9 3.2* 3.3*   No results for input(s): LIPASE, AMYLASE in the last 168 hours. No results for input(s): AMMONIA in the last 168 hours. Coagulation Profile: Recent Labs  Lab 11/25/19 0633  INR 1.0   Cardiac Enzymes: Recent Labs  Lab 11/24/19 1824 11/25/19 0429 11/25/19 1451 11/26/19 0528  CKTOTAL >50,000* >50,000* >50,000* >50,000*  CKMB 221.4*  --   --   --    BNP (last 3 results) No results for input(s): PROBNP in the last 8760 hours. HbA1C: No results for input(s): HGBA1C in the last 72 hours. CBG: No results for  input(s): GLUCAP in the last 168 hours. Lipid Profile: No results for input(s): CHOL, HDL, LDLCALC, TRIG, CHOLHDL, LDLDIRECT in the last 72 hours. Thyroid Function Tests: No results for input(s): TSH, T4TOTAL, FREET4, T3FREE, THYROIDAB in the last 72 hours. Anemia Panel: No results for input(s): VITAMINB12, FOLATE, FERRITIN, TIBC, IRON, RETICCTPCT in the last 72 hours. Urine analysis:    Component Value Date/Time   COLORURINE BROWN (A) 11/24/2019 1802   APPEARANCEUR HAZY (A) 11/24/2019 1802   LABSPEC >1.030 (H) 11/24/2019 1802   PHURINE 6.0 11/24/2019 1802   GLUCOSEU NEGATIVE 11/24/2019 1802   HGBUR LARGE (A) 11/24/2019 1802   BILIRUBINUR SMALL (A) 11/24/2019 1802   KETONESUR NEGATIVE 11/24/2019 1802   PROTEINUR >300 (A) 11/24/2019 1802   NITRITE NEGATIVE 11/24/2019 1802   LEUKOCYTESUR NEGATIVE 11/24/2019 1802   Sepsis Labs: @LABRCNTIP (procalcitonin:4,lacticidven:4)  ) Recent Results (from the past 240 hour(s))  SARS CORONAVIRUS 2 (TAT 6-24 HRS) Nasopharyngeal Nasopharyngeal Swab     Status: None   Collection Time: 11/24/19  9:14 PM   Specimen: Nasopharyngeal Swab  Result Value Ref Range Status   SARS Coronavirus 2 NEGATIVE NEGATIVE Final    Comment: (NOTE) SARS-CoV-2 target nucleic acids are NOT DETECTED. The SARS-CoV-2 RNA is generally detectable in upper and lower respiratory specimens during the acute phase of infection. Negative results do not preclude SARS-CoV-2 infection, do not rule out co-infections with other pathogens, and should not be used as the sole basis for treatment or other patient management decisions. Negative results must be combined with clinical observations, patient history, and epidemiological information. The expected result is Negative. Fact Sheet for Patients: 11/26/19 Fact Sheet for Healthcare Providers: HairSlick.no This test is not yet approved or cleared by the quierodirigir.com  FDA and  has been authorized for detection and/or diagnosis of SARS-CoV-2 by FDA under an Emergency Use Authorization (EUA). This EUA will remain  in effect (meaning this test can be used) for the duration of the COVID-19 declaration under Section 56 4(b)(1) of the Act, 21 U.S.C. section 360bbb-3(b)(1), unless the authorization is terminated or revoked sooner. Performed at [facility], [address], [place], [place]          Radiology Studies: No results found.      Scheduled Meds: Continuous Infusions: . sodium chloride 200 mL/hr at 11/26/19 0258     LOS: 2 days    Time spent: 25 minutes.    11/28/19, MD  [facility] Pager #: [phone] 7PM-7AM contact night coverage as above

## 2019-11-27 LAB — CK
Total CK: >50000 U/L — ABNORMAL HIGH (ref 49–397)
Total CK: 12621 U/L — ABNORMAL HIGH (ref 49–397)

## 2019-11-27 LAB — PHOSPHORUS: Phosphorus: 3.6 mg/dL (ref 2.5–4.6)

## 2019-11-27 LAB — COMPREHENSIVE METABOLIC PANEL
ALT: 527 U/L — ABNORMAL HIGH (ref 0–44)
AST: 1348 U/L — ABNORMAL HIGH (ref 15–41)
Albumin: 3.2 g/dL — ABNORMAL LOW (ref 3.5–5.0)
Alkaline Phosphatase: 62 U/L (ref 38–126)
Anion gap: 8 (ref 5–15)
BUN: 7 mg/dL (ref 6–20)
CO2: 25 mmol/L (ref 22–32)
Calcium: 8.8 mg/dL — ABNORMAL LOW (ref 8.9–10.3)
Chloride: 107 mmol/L (ref 98–111)
Creatinine, Ser: 0.99 mg/dL (ref 0.61–1.24)
GFR calc Af Amer: >60 mL/min (ref >60)
GFR calc non Af Amer: >60 mL/min (ref >60)
Glucose, Bld: 99 mg/dL (ref 70–99)
Potassium: 4 mmol/L (ref 3.5–5.1)
Sodium: 140 mmol/L (ref 135–145)
Total Bilirubin: 0.8 mg/dL (ref 0.3–1.2)
Total Protein: 6.8 g/dL (ref 6.5–8.1)

## 2019-11-27 LAB — MAGNESIUM: Magnesium: 1.7 mg/dL (ref 1.7–2.4)

## 2019-11-27 LAB — LACTATE DEHYDROGENASE: LDH: 760 U/L — ABNORMAL HIGH (ref 98–192)

## 2019-11-27 NOTE — Progress Notes (Signed)
Pt discharged to home. Discharge instructions and medication education provided to pt.  

## 2019-11-27 NOTE — Discharge Summary (Signed)
Physician Discharge Summary  Patient ID: Cody Rodriguez MRN: [id number] DOB/AGE: 35/35/1987 35 y.o.  Admit date: 11/24/2019 Discharge date: 11/27/2019  Admission Diagnoses:  Discharge Diagnoses:  Principal Problem:   Rhabdomyolysis Active Problems:   Transaminitis   Discharged Condition: stable  Hospital Course: Patient is a 34 year old African-American male with past medical history significant for neuropathy.  Patient was admitted with rhabdomyolysis.  On admission, CPK was greater than 50,000.  Patient was admitted and managed with aggressive hydration.  With hydration, CPK dropped to 12,621. Renal function remained normal.  Patient and patient's mother agreed to be discharged back home.  Patient will be discharged back home to the care of the primary care provider.  The primary care provider will kindly repeat CPK and renal function within the next 48 hours.  Patient's blood pressure will need to be monitored closely as episodes of elevated blood pressure when noted during the hospital stay.   Consults: None  Significant Diagnostic Studies:  CPK was greater than 50,000 on presentation.  Prior to discharge, CPK was down to 12,621.    Treatments: Aggressive hydration.  Discharge Exam: Blood pressure 131/87, pulse 75, temperature 98.3 F (36.8 C), temperature source Oral, resp. rate 18, height 5\' 10"  (1.778 m), weight 113.9 kg, SpO2 100 %.   Disposition: Discharge disposition: 01-Home or Self Care   Discharge Instructions    Diet - low sodium heart healthy   Complete by: As directed    Increase activity slowly   Complete by: As directed      Allergies as of 11/27/2019   No Known Allergies     Medication List    STOP taking these medications   acetaminophen 500 MG tablet Commonly known as: TYLENOL   cyclobenzaprine 10 MG tablet Commonly known as: FLEXERIL   gabapentin 300 MG capsule Commonly known as: Neurontin   predniSONE 10 MG tablet Commonly known as:  DELTASONE        Signed2/23/2021 11/27/2019, 2:46 PM

## 2019-11-28 LAB — ALDOLASE: Aldolase: <1.2 U/L — ABNORMAL LOW (ref 3.3–10.3)

## 2019-11-28 LAB — ANTI-JO 1 ANTIBODY, IGG: Anti JO-1: <0.2 AI (ref 0.0–0.9)

## 2019-11-29 LAB — CARNITINE / ACYLCARNITINE PROFILE, BLD
Carnitine, Esterfied/Free: 0.2 Ratio (ref 0.0–0.9)
Carnitine, Free: 40 umol/L (ref 20–55)
Carnitine, Total: 46 umol/L (ref 27–73)

## 2020-05-11 ENCOUNTER — Other Ambulatory Visit: Payer: Self-pay | Admitting: Physician Assistant

## 2020-05-11 DIAGNOSIS — R200 Anesthesia of skin: Secondary | ICD-10-CM

## 2020-05-11 DIAGNOSIS — G8929 Other chronic pain: Secondary | ICD-10-CM

## 2020-05-11 DIAGNOSIS — R531 Weakness: Secondary | ICD-10-CM

## 2020-05-11 DIAGNOSIS — M545 Low back pain, unspecified: Secondary | ICD-10-CM

## 2020-05-12 ENCOUNTER — Ambulatory Visit
Admission: RE | Admit: 2020-05-12 | Discharge: 2020-05-12 | Disposition: A | Discharge status: Home / Self Care | Payer: 59 | Source: Ambulatory Visit | Attending: Physician Assistant | Admitting: Physician Assistant

## 2020-05-12 ENCOUNTER — Other Ambulatory Visit: Payer: Self-pay

## 2020-05-12 DIAGNOSIS — R200 Anesthesia of skin: Secondary | ICD-10-CM

## 2020-05-12 DIAGNOSIS — R531 Weakness: Secondary | ICD-10-CM

## 2020-05-12 DIAGNOSIS — G8929 Other chronic pain: Secondary | ICD-10-CM

## 2020-05-12 DIAGNOSIS — M545 Low back pain, unspecified: Secondary | ICD-10-CM

## 2020-05-12 IMAGING — MR MR LUMBAR SPINE W/O CM
4 of 5 series · 23 of 48 positions shown · non-contrast
Comparison: No pertinent prior exams are available for comparison.

CLINICAL DATA: Chronic low back pain, unspecified back pain
laterality, unspecified whether sciatica present. Numbness of both
lower extremities, weakness. Additional history provided by scanning
technologist: Patient reports 3 weeks of bilateral low back pain
that runs into both legs with numbness in both feet.

EXAM:
MRI LUMBAR SPINE WITHOUT CONTRAST
TECHNIQUE: Multiplanar, multisequence MR imaging of the lumbar spine was
performed. No intravenous contrast was administered.

[Series 5: T2 · sagittal · 4.0mm · 0.73mm/px · 6 of 13 slices shown (1 of 2)]
[im 1/13]
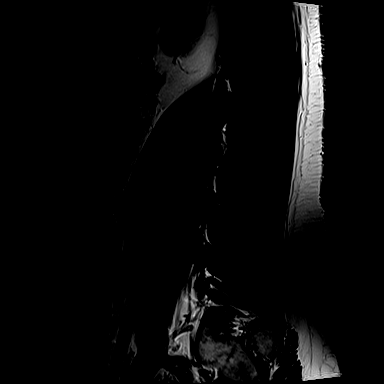
[im 3/13]
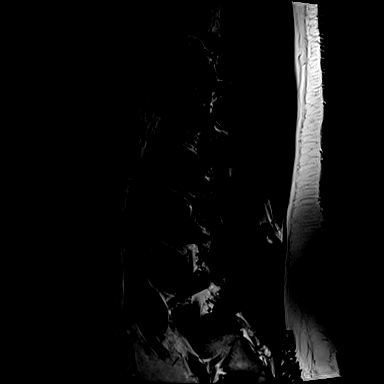
[im 5/13]
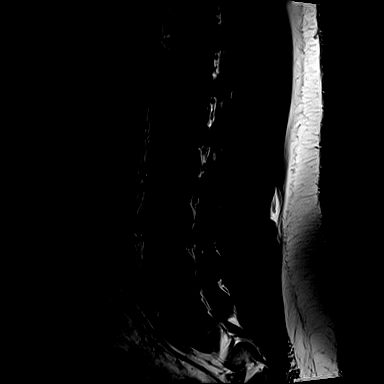
[im 8/13]
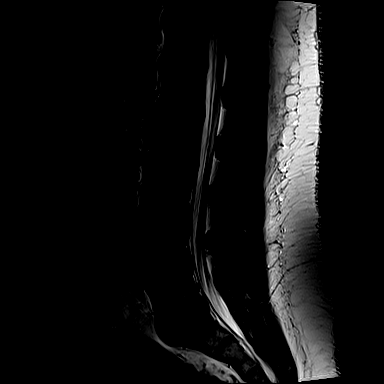
[im 10/13]
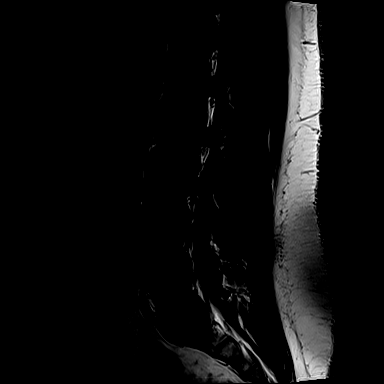
[im 13/13]
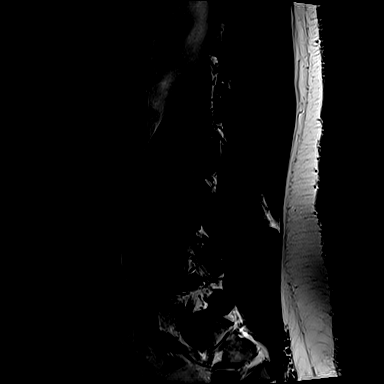

[Series 6: T1 · sagittal · 4.0mm · 0.73mm/px · 4 of 13 slices shown (1 of 2)]
[im 1/13]
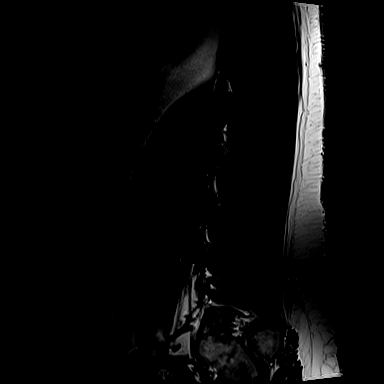
[im 4/13]
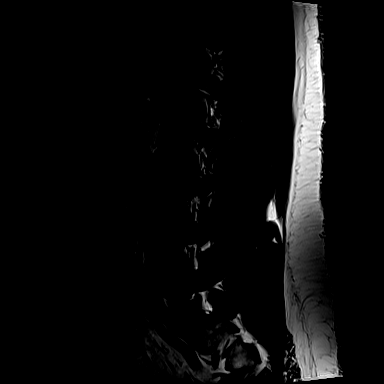
[im 7/13]
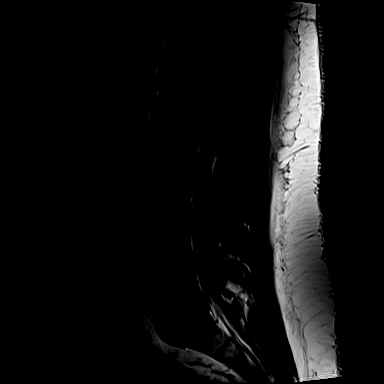
[im 13/13]
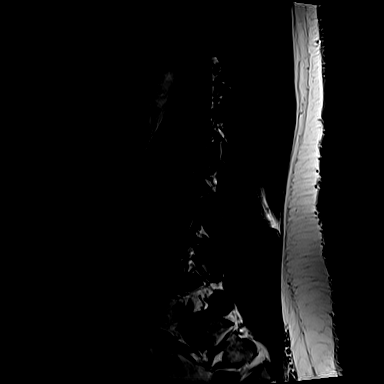

[Series 10: T2 · axial · 4.0mm · 0.35mm/px · z∈[-90,+118]mm · 10 of 39 slices shown (2 of 2)]
[im 3/39]
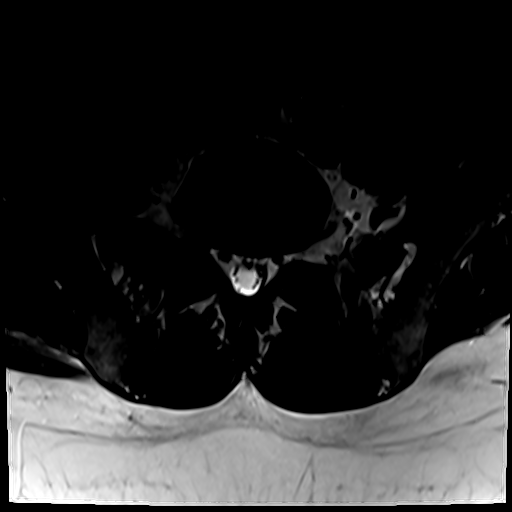
[im 6/39]
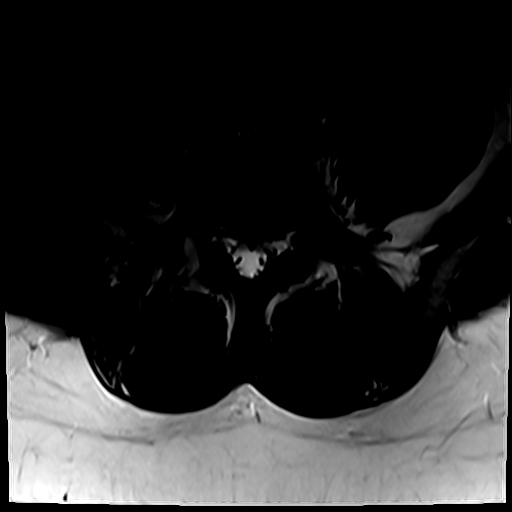
[im 8/39]
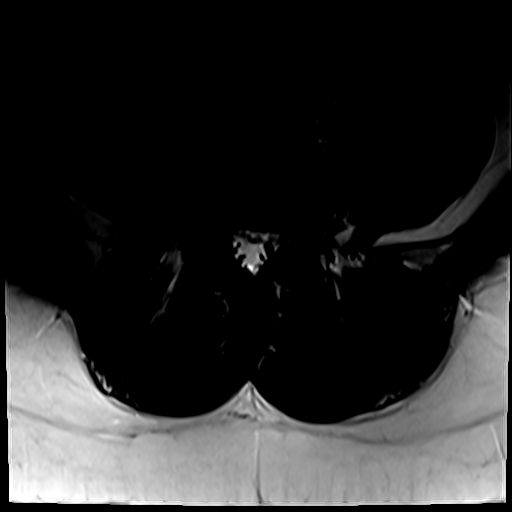
[im 13/39]
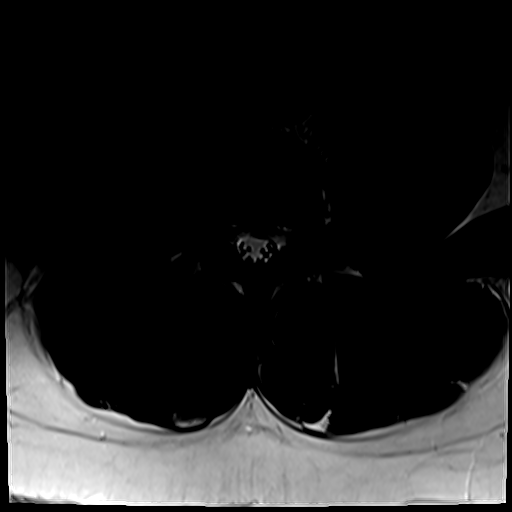
[im 18/39]
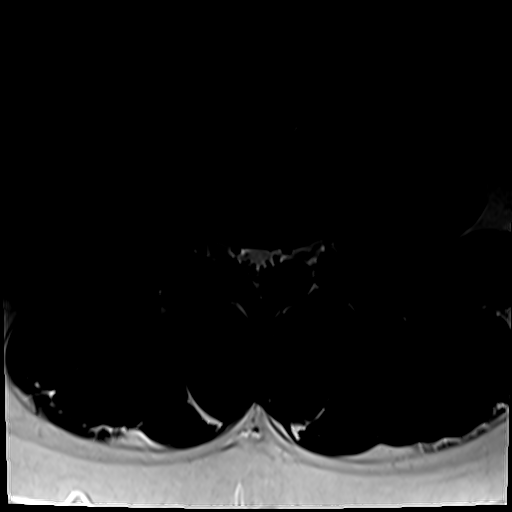
[im 21/39]
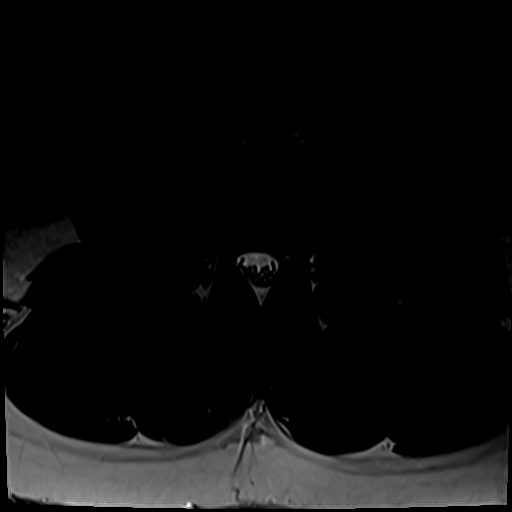
[im 23/39]
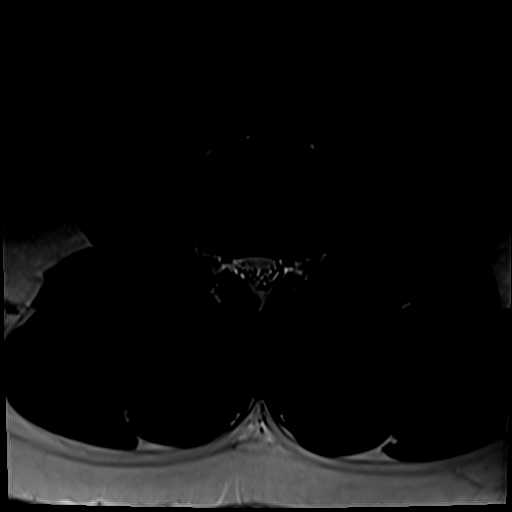
[im 28/39]
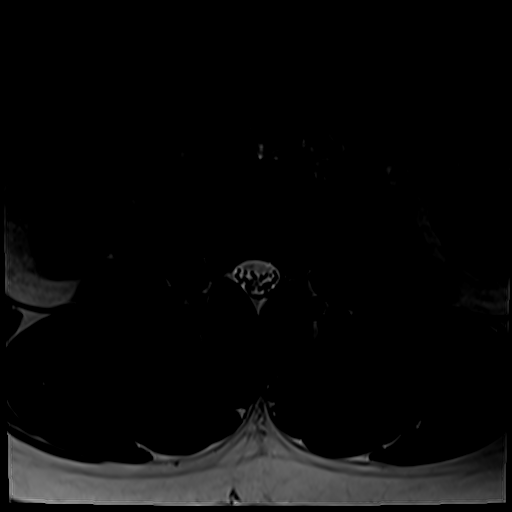
[im 33/39]
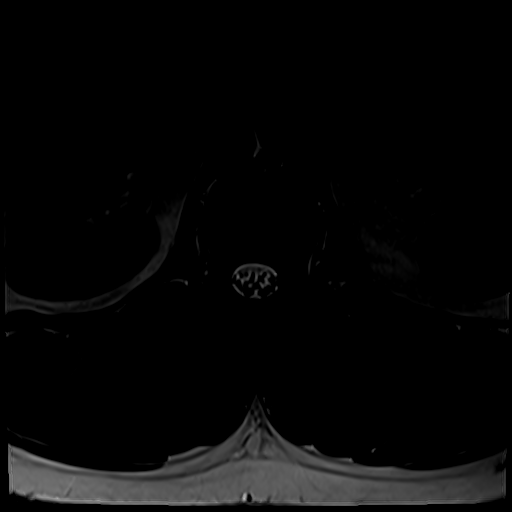
[im 39/39]
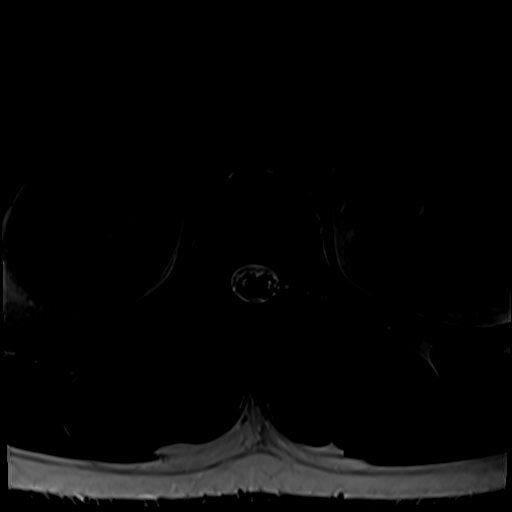

[Series 13: T1 · axial · 4.0mm · 0.35mm/px · z∈[-76,+88]mm · 3 of 39 slices shown (2 of 2)]
[im 6/39]
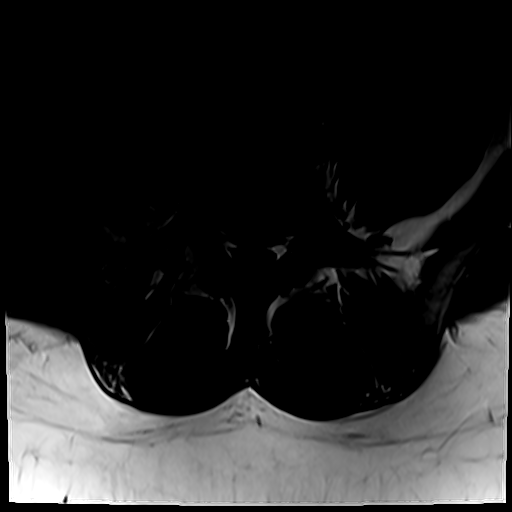
[im 21/39]
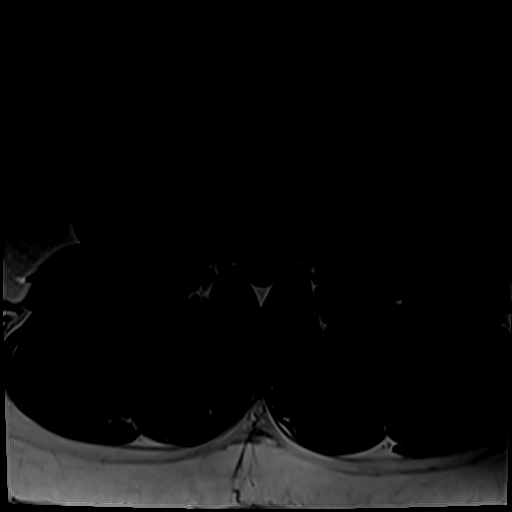
[im 33/39]
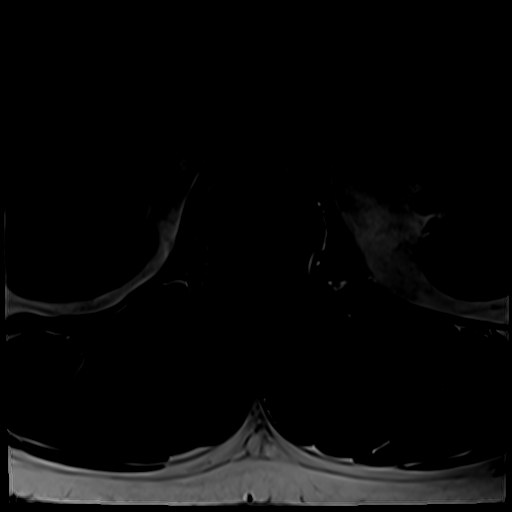

[23 of 48 positions shown; findings below may reference images not displayed]

FINDINGS: Segmentation: For the purposes of this dictation, five lumbar
vertebrae are assumed and the caudal most well-formed intervertebral
disc is designated L5-S1.

Alignment:  No significant spondylolisthesis.

Vertebrae: Vertebral body height is maintained. No marrow edema or
suspicious osseous lesion.

Conus medullaris and cauda equina: Conus extends to the L1-L2 level.
No signal abnormality within the visualized distal spinal cord

Paraspinal and other soft tissues: No abnormality identified within
included portions of the abdomen/retroperitoneum.

Disc levels:

Mild multilevel loss of intervertebral disc height.

Mild congenital narrowing of the lumbar spinal canal.

T12-L1: No significant disc herniation or stenosis.

L1-L2: No significant disc herniation or stenosis.

L2-L3: No significant disc herniation or stenosis.

L3-L4: No significant disc herniation or stenosis.

L4-L5: Mild facet arthrosis/ligamentum flavum hypertrophy. There is
minimal bilateral subarticular narrowing with slight crowding of the
descending left L5 nerve root. Central canal patent. No significant
foraminal stenosis.

L5-S1: Minimal disc bulge. Mild facet arthrosis. No significant
spinal canal stenosis or neural foraminal narrowing.
IMPRESSION: Mild lumbar spondylosis superimposed with mild congenital narrowing
of the lumbar spinal canal. Findings are most notably as follows.

Mild multilevel disc height loss.

At L4-L5, there is mild facet arthrosis/ligamentum flavum
hypertrophy. Minimal bilateral subarticular narrowing (greater on
the left) with slight crowding of the descending left L5 nerve root.
Central canal patent. No significant foraminal stenosis.

At L5-S1, there is minimal disc bulging. Mild facet arthrosis. No
significant canal or foraminal stenosis.

## 2020-07-12 ENCOUNTER — Other Ambulatory Visit: Payer: Self-pay

## 2020-07-12 ENCOUNTER — Ambulatory Visit (INDEPENDENT_AMBULATORY_CARE_PROVIDER_SITE_OTHER): Payer: 59 | Admitting: Family Medicine

## 2020-07-12 ENCOUNTER — Encounter (INDEPENDENT_AMBULATORY_CARE_PROVIDER_SITE_OTHER): Payer: Self-pay | Admitting: Family Medicine

## 2020-07-12 VITALS — BP 135/73 | HR 70 | Temp 98.4°F | Ht 70.0 in | Wt 255.0 lb

## 2020-07-12 DIAGNOSIS — R202 Paresthesia of skin: Secondary | ICD-10-CM | POA: Diagnosis not present

## 2020-07-12 DIAGNOSIS — R5383 Other fatigue: Secondary | ICD-10-CM | POA: Diagnosis not present

## 2020-07-12 DIAGNOSIS — Z1331 Encounter for screening for depression: Secondary | ICD-10-CM

## 2020-07-12 DIAGNOSIS — Z6836 Body mass index (BMI) 36.0-36.9, adult: Secondary | ICD-10-CM

## 2020-07-12 DIAGNOSIS — Z9189 Other specified personal risk factors, not elsewhere classified: Secondary | ICD-10-CM | POA: Diagnosis not present

## 2020-07-12 DIAGNOSIS — I10 Essential (primary) hypertension: Secondary | ICD-10-CM

## 2020-07-12 DIAGNOSIS — Z0289 Encounter for other administrative examinations: Secondary | ICD-10-CM

## 2020-07-12 DIAGNOSIS — R0602 Shortness of breath: Secondary | ICD-10-CM | POA: Diagnosis not present

## 2020-07-13 LAB — LIPID PANEL WITH LDL/HDL RATIO
Cholesterol, Total: 203 mg/dL — ABNORMAL HIGH (ref 100–199)
HDL: 38 mg/dL — ABNORMAL LOW (ref >39)
LDL Chol Calc (NIH): 146 mg/dL — ABNORMAL HIGH (ref 0–99)
LDL/HDL Ratio: 3.8 ratio — ABNORMAL HIGH (ref 0.0–3.6)
Triglycerides: 107 mg/dL (ref 0–149)
VLDL Cholesterol Cal: 19 mg/dL (ref 5–40)

## 2020-07-13 LAB — COMPREHENSIVE METABOLIC PANEL
ALT: 26 IU/L (ref 0–44)
AST: 24 IU/L (ref 0–40)
Albumin/Globulin Ratio: 1.5 (ref 1.2–2.2)
Albumin: 4.5 g/dL (ref 4.0–5.0)
Alkaline Phosphatase: 93 IU/L (ref 44–121)
BUN/Creatinine Ratio: 8 — ABNORMAL LOW (ref 9–20)
BUN: 9 mg/dL (ref 6–20)
Bilirubin Total: 0.5 mg/dL (ref 0.0–1.2)
CO2: 25 mmol/L (ref 20–29)
Calcium: 9.6 mg/dL (ref 8.7–10.2)
Chloride: 102 mmol/L (ref 96–106)
Creatinine, Ser: 1.18 mg/dL (ref 0.76–1.27)
GFR calc Af Amer: 93 mL/min/{1.73_m2} (ref >59)
GFR calc non Af Amer: 80 mL/min/{1.73_m2} (ref >59)
Globulin, Total: 3 g/dL (ref 1.5–4.5)
Glucose: 81 mg/dL (ref 65–99)
Potassium: 4.5 mmol/L (ref 3.5–5.2)
Sodium: 137 mmol/L (ref 134–144)
Total Protein: 7.5 g/dL (ref 6.0–8.5)

## 2020-07-13 LAB — CBC WITH DIFFERENTIAL/PLATELET
Basophils Absolute: 0.1 10*3/uL (ref 0.0–0.2)
Basos: 1 %
EOS (ABSOLUTE): 0.1 10*3/uL (ref 0.0–0.4)
Eos: 2 %
Hematocrit: 41.9 % (ref 37.5–51.0)
Hemoglobin: 14.3 g/dL (ref 13.0–17.7)
Immature Grans (Abs): 0 10*3/uL (ref 0.0–0.1)
Immature Granulocytes: 0 %
Lymphocytes Absolute: 2.7 10*3/uL (ref 0.7–3.1)
Lymphs: 45 %
MCH: 29.2 pg (ref 26.6–33.0)
MCHC: 34.1 g/dL (ref 31.5–35.7)
MCV: 86 fL (ref 79–97)
Monocytes Absolute: 0.4 10*3/uL (ref 0.1–0.9)
Monocytes: 7 %
Neutrophils Absolute: 2.7 10*3/uL (ref 1.4–7.0)
Neutrophils: 45 %
Platelets: 401 10*3/uL (ref 150–450)
RBC: 4.9 x10E6/uL (ref 4.14–5.80)
RDW: 14.6 % (ref 11.6–15.4)
WBC: 5.9 10*3/uL (ref 3.4–10.8)

## 2020-07-13 LAB — TSH: TSH: 1.41 u[IU]/mL (ref 0.450–4.500)

## 2020-07-13 LAB — HEMOGLOBIN A1C
Est. average glucose Bld gHb Est-mCnc: 120 mg/dL
Hgb A1c MFr Bld: 5.8 % — ABNORMAL HIGH (ref 4.8–5.6)

## 2020-07-13 LAB — T3: T3, Total: 144 ng/dL (ref 71–180)

## 2020-07-13 LAB — VITAMIN D 25 HYDROXY (VIT D DEFICIENCY, FRACTURES): Vit D, 25-Hydroxy: 11.6 ng/mL — ABNORMAL LOW (ref 30.0–100.0)

## 2020-07-13 LAB — VITAMIN B12: Vitamin B-12: 1217 pg/mL (ref 232–1245)

## 2020-07-13 LAB — INSULIN, RANDOM: INSULIN: 26.1 u[IU]/mL — ABNORMAL HIGH (ref 2.6–24.9)

## 2020-07-13 LAB — FOLATE: Folate: 8.5 ng/mL (ref >3.0)

## 2020-07-13 LAB — T4, FREE: Free T4: 1.25 ng/dL (ref 0.82–1.77)

## 2020-07-16 NOTE — Progress Notes (Signed)
Chief Complaint:   OBESITY Cody Rodriguez (MR# [id number]) is a 34 y.o. male who presents for evaluation and treatment of obesity and related comorbidities. Current BMI is Body mass index is 36.59 kg/m. Cody Rodriguez has been struggling with his weight for many years and has been unsuccessful in either losing weight, maintaining weight loss, or reaching his healthy weight goal.  Cody Rodriguez is currently in the action stage of change and ready to dedicate time achieving and maintaining a healthier weight. Cody Rodriguez is interested in becoming our patient and working on intensive lifestyle modifications including (but not limited to) diet and exercise for weight loss.  Cody Rodriguez works full time at [employer].  Cody Rodriguez lives with his fiance Cody Rodriguez and her 64 year old daughter.  His sister comes here and has lost a lot of weight.  Cody Rodriguez says Cody Rodriguez wants to get healthy also.  Cody Rodriguez's habits were reviewed today and are as follows: His family eats meals together, Cody Rodriguez thinks his family will eat healthier with him, his desired weight loss is 56 pounds, Cody Rodriguez has been heavy most of his life, Cody Rodriguez started gaining excessive weight during the pandemic, his heaviest weight ever was 265 pounds, Cody Rodriguez snacks frequently in the evenings, Cody Rodriguez skips breakfast frequently, Cody Rodriguez is frequently drinking liquids with calories, Cody Rodriguez frequently makes poor food choices, Cody Rodriguez frequently eats larger portions than normal and Cody Rodriguez struggles with emotional eating.  This is the patient's first visit at [facility].  The patient's NEW PATIENT PACKET that they filled out prior to today's office visit was reviewed at length and some information from that paperwork was also included within the following office visit note.    Included in the packet: current and past health history, medications, allergies, ROS, gynecologic history (women only), surgical history, family history, social history, weight history, weight loss surgery history (for those that  have had weight loss surgery), nutritional evaluation, mood and food questionnaire along with a depression screening (PHQ9) on all patients, an Epworth questionnaire, sleep habits questionnaire, patient life and health improvement goals questionnaire. These will all be scanned into the patient's chart under media.   During the visit, I independently reviewed the patient's EKG, bioimpedance scale results, and indirect calorimeter results. I used this information to tailor a meal plan for the patient that will help Cody Rodriguez to lose weight and will improve his obesity-related conditions going forward. I performed a medically necessary appropriate examination and/or evaluation. I discussed the assessment and treatment plan with the patient. The patient was provided an opportunity to ask questions and all were answered. The patient agreed with the plan and demonstrated an understanding of the instructions. Labs were ordered at this visit and will be reviewed at the next visit unless more critical results need to be addressed immediately. Clinical information was updated and documented in the EMR.   Time spent on visit including pre-visit chart review and post-visit care was estimated to be 60-74 minutes.  A separate 15 minutes was spent on risk counseling (see above/below).   Depression Screen Cody Rodriguez's Food and Mood (modified PHQ-9) score was 12.  Depression screen PHQ 2/9 07/12/2020  Decreased Interest 3  Down, Depressed, Hopeless 1  PHQ - 2 Score 4  Altered sleeping 0  Tired, decreased energy 3  Change in appetite 1  Feeling bad or failure about yourself  0  Trouble concentrating 2  Moving slowly or fidgety/restless 2  Suicidal thoughts 0  PHQ-9 Score 12   Assessment/Plan:   1. Other fatigue  Travers admits to daytime somnolence and reports waking up still tired. Patent has a history of symptoms of daytime fatigue and morning fatigue. Cody Rodriguez generally gets 5 hours of sleep per night, and states  that Cody Rodriguez has generally restful sleep. Snoring is not present. Apneic episodes are not present. Epworth Sleepiness Score is 13.  -  Cody Rodriguez feels that Cody Rodriguez sleeps well and wakes up rested most day.  -  Cody Rodriguez denies that sleep apnea type sx are an issue for him.  Cody Rodriguez does feel that his weight is causing his energy to be lower than it should be. Fatigue may be related to obesity, depression or many other causes. Labs will be ordered, and in the meanwhile, [name] will focus on self care including making healthy food choices, increasing physical activity and focusing on stress reduction.  Cody Rodriguez declines a sleep study today and does not feel it is an issue for now.  - EKG 12-Lead - Vitamin B12 - T3 - T4, free - TSH - VITAMIN D 25 Hydroxy (Vit-D Deficiency, Fractures) - CBC with Differential/Platelet - Hemoglobin A1c - Insulin, random - Folate   2. Shortness of breath on exertion [name] notes increasing shortness of breath with exercising and seems to be worsening over time with weight gain. Cody Rodriguez notes getting out of breath sooner with activity than Cody Rodriguez used to. This has gotten worse recently. [name] denies shortness of breath at rest or orthopnea.  Cody Rodriguez does feel that Cody Rodriguez gets out of breath more easily that Cody Rodriguez used to when Cody Rodriguez exercises. Cody Rodriguez's shortness of breath appears to be obesity related and exercise induced. Cody Rodriguez has agreed to work on weight loss and gradually increase exercise to treat his exercise induced shortness of breath. Will continue to monitor closely.  - Lipid Panel With LDL/HDL Ratio   3. Essential hypertension Cody Rodriguez says that his blood pressure has been 140/90 or higher on a regular basis for about a year.  Cody Rodriguez was told by his doctor that nothing needed to be done about it.  BP Readings from Last 3 Encounters:  07/12/20 135/73  11/27/19 131/87  05/12/17 (!) 163/97   Plan:  Will check labs today.  -  Asked pt to please check at home if possible and we will closely follow here  as well.   Initial BP today up but upon reck--> did come down to type I HTN stage  - Comprehensive metabolic panel   4. Paresthesias Cody Rodriguez has seen Neurology in the past and has been ruled out for multiple sclerosis.  Plan:  Check labs today.  B12, thyroid panel, A1c etc   5. Depression screening Kelan was screened for depression today as part of his new patient  workup.  PHQ-9 is 12.  -Pt states however, that Cody Rodriguez does not feel depressed in the least and generally feels happy most of the time.  + findings on PHQ appear to be c/w feeling fatigued/ tired  Marcoantonio had a positive depression screening. Depression is commonly associated with obesity and often results in emotional eating behaviors. We will monitor this closely and work on CBT to help improve the non-hunger eating patterns. Referral to Psychology may be required if no improvement is seen as Cody Rodriguez continues in our clinic.  6. At risk for dehydration Davyd is at risk for dehydration due to inadequate water intake and a history of rhabdomyolysis in the past.  Claire was given approximately 15 minutes dehydration prevention counseling today. Cailan is at risk for dehydration due to weight  loss and current medication(s). Cody Rodriguez was encouraged to hydrate and monitor fluid status to avoid dehydration as well as weight loss plateaus.   7. Class 2 severe obesity with serious comorbidity and body mass index (BMI) of 36.0 to 36.9 in adult, unspecified obesity type (HCC)  Nakhi is currently in the action stage of change and his goal is to continue with weight loss efforts. I recommend Ariv begin the structured treatment plan as follows:  Cody Rodriguez has agreed to the Category 2 Plan.  Exercise goals: As is.   Behavioral modification strategies: increasing lean protein intake, decreasing simple carbohydrates, meal planning and cooking strategies, keeping healthy foods in the home and planning for success.  Cody Rodriguez was informed of the importance of  frequent follow-up visits to maximize his success with intensive lifestyle modifications for his multiple health conditions. Cody Rodriguez was informed we would discuss his lab results at his next visit unless there is a critical issue that needs to be addressed sooner. Elwin agreed to keep his next visit at the agreed upon time to discuss these results.  Objective:   Blood pressure 135/73, pulse 70, temperature 98.4 F (36.9 C), height 5\' 10"  (1.778 m), weight 255 lb (115.7 kg), SpO2 99 %. Body mass index is 36.59 kg/m.  EKG: Normal sinus rhythm, rate 67 bpm.  Indirect Calorimeter completed today shows a VO2 of 271 and a REE of 1883.  His calculated basal metabolic rate is thus his basal metabolic rate is worse than expected.  General: Cooperative, alert, well developed, in no acute distress. HEENT: Conjunctivae and lids unremarkable. Cardiovascular: Regular rhythm.  Lungs: Normal work of breathing. Neurologic: No focal deficits.   Lab Results  Component Value Date   CREATININE 1.18 07/12/2020   BUN 9 07/12/2020   NA 137 07/12/2020   K 4.5 07/12/2020   CL 102 07/12/2020   CO2 25 07/12/2020   Lab Results  Component Value Date   ALT 26 07/12/2020   AST 24 07/12/2020   ALKPHOS 93 07/12/2020   BILITOT 0.5 07/12/2020   Lab Results  Component Value Date   HGBA1C 5.8 (H) 07/12/2020   HGBA1C 5.1 06/26/2014   Lab Results  Component Value Date   INSULIN 26.1 (H) 07/12/2020   Lab Results  Component Value Date   TSH 1.410 07/12/2020   Lab Results  Component Value Date   CHOL 203 (H) 07/12/2020   HDL 38 (L) 07/12/2020   LDLCALC 146 (H) 07/12/2020   TRIG 107 07/12/2020   Lab Results  Component Value Date   WBC 5.9 07/12/2020   HGB 14.3 07/12/2020   HCT 41.9 07/12/2020   MCV 86 07/12/2020   PLT 401 07/12/2020   Attestation Statements:   Reviewed by clinician on day of visit: allergies, medications, problem list, medical history, surgical history, family history, social  history, and previous encounter notes.  I, 09/11/2020, CMA, am acting as Insurance claims handler for Energy manager, DO.  I have reviewed the above documentation for accuracy and completeness, and I agree with the above. [employer], D.O.  The 21st Century Cures Act was signed into law in 2016 which includes the topic of electronic health records.  This provides immediate access to information in MyChart.  This includes consultation notes, operative notes, office notes, lab results and pathology reports.  If you have any questions about what you read please let 2017 know at your next visit so we can discuss your concerns and take corrective action if need  be.  We are right here with you.

## 2020-07-20 ENCOUNTER — Emergency Department (HOSPITAL_COMMUNITY): Payer: 59

## 2020-07-20 ENCOUNTER — Emergency Department (HOSPITAL_BASED_OUTPATIENT_CLINIC_OR_DEPARTMENT_OTHER): Payer: 59

## 2020-07-20 ENCOUNTER — Other Ambulatory Visit: Payer: Self-pay

## 2020-07-20 ENCOUNTER — Inpatient Hospital Stay (HOSPITAL_BASED_OUTPATIENT_CLINIC_OR_DEPARTMENT_OTHER)
Admission: EM | Admit: 2020-07-20 | Discharge: 2020-07-24 | DRG: 059 | Disposition: A | Discharge status: Home / Self Care | Payer: 59 | Attending: Family Medicine | Admitting: Family Medicine

## 2020-07-20 ENCOUNTER — Encounter (HOSPITAL_BASED_OUTPATIENT_CLINIC_OR_DEPARTMENT_OTHER): Payer: Self-pay | Admitting: *Deleted

## 2020-07-20 DIAGNOSIS — R9089 Other abnormal findings on diagnostic imaging of central nervous system: Secondary | ICD-10-CM

## 2020-07-20 DIAGNOSIS — R531 Weakness: Secondary | ICD-10-CM

## 2020-07-20 DIAGNOSIS — R42 Dizziness and giddiness: Secondary | ICD-10-CM | POA: Diagnosis not present

## 2020-07-20 DIAGNOSIS — I10 Essential (primary) hypertension: Secondary | ICD-10-CM

## 2020-07-20 DIAGNOSIS — G35A Relapsing-remitting multiple sclerosis: Secondary | ICD-10-CM | POA: Diagnosis present

## 2020-07-20 DIAGNOSIS — H5500 Unspecified nystagmus: Secondary | ICD-10-CM | POA: Diagnosis present

## 2020-07-20 DIAGNOSIS — R7989 Other specified abnormal findings of blood chemistry: Secondary | ICD-10-CM | POA: Diagnosis present

## 2020-07-20 DIAGNOSIS — E669 Obesity, unspecified: Secondary | ICD-10-CM | POA: Diagnosis present

## 2020-07-20 DIAGNOSIS — R2681 Unsteadiness on feet: Secondary | ICD-10-CM | POA: Diagnosis not present

## 2020-07-20 DIAGNOSIS — Z20822 Contact with and (suspected) exposure to covid-19: Secondary | ICD-10-CM | POA: Diagnosis present

## 2020-07-20 DIAGNOSIS — Z9114 Patient's other noncompliance with medication regimen: Secondary | ICD-10-CM

## 2020-07-20 DIAGNOSIS — N179 Acute kidney failure, unspecified: Secondary | ICD-10-CM | POA: Diagnosis present

## 2020-07-20 DIAGNOSIS — G35D Multiple sclerosis, unspecified: Secondary | ICD-10-CM

## 2020-07-20 DIAGNOSIS — G8929 Other chronic pain: Secondary | ICD-10-CM | POA: Diagnosis present

## 2020-07-20 DIAGNOSIS — R7303 Prediabetes: Secondary | ICD-10-CM | POA: Diagnosis present

## 2020-07-20 DIAGNOSIS — Z91128 Patient's intentional underdosing of medication regimen for other reason: Secondary | ICD-10-CM

## 2020-07-20 DIAGNOSIS — Z833 Family history of diabetes mellitus: Secondary | ICD-10-CM

## 2020-07-20 DIAGNOSIS — M545 Low back pain, unspecified: Secondary | ICD-10-CM | POA: Diagnosis present

## 2020-07-20 DIAGNOSIS — R739 Hyperglycemia, unspecified: Secondary | ICD-10-CM | POA: Diagnosis present

## 2020-07-20 DIAGNOSIS — Z79899 Other long term (current) drug therapy: Secondary | ICD-10-CM

## 2020-07-20 DIAGNOSIS — G35 Multiple sclerosis: Principal | ICD-10-CM | POA: Diagnosis present

## 2020-07-20 DIAGNOSIS — Z87891 Personal history of nicotine dependence: Secondary | ICD-10-CM

## 2020-07-20 DIAGNOSIS — R269 Unspecified abnormalities of gait and mobility: Secondary | ICD-10-CM | POA: Diagnosis present

## 2020-07-20 DIAGNOSIS — M79602 Pain in left arm: Secondary | ICD-10-CM

## 2020-07-20 DIAGNOSIS — Z8249 Family history of ischemic heart disease and other diseases of the circulatory system: Secondary | ICD-10-CM

## 2020-07-20 DIAGNOSIS — T50906A Underdosing of unspecified drugs, medicaments and biological substances, initial encounter: Secondary | ICD-10-CM | POA: Diagnosis present

## 2020-07-20 DIAGNOSIS — M79601 Pain in right arm: Secondary | ICD-10-CM

## 2020-07-20 DIAGNOSIS — Z6836 Body mass index (BMI) 36.0-36.9, adult: Secondary | ICD-10-CM

## 2020-07-20 DIAGNOSIS — D75839 Thrombocytosis, unspecified: Secondary | ICD-10-CM | POA: Diagnosis present

## 2020-07-20 LAB — CBC WITH DIFFERENTIAL/PLATELET
Abs Immature Granulocytes: 0.04 10*3/uL (ref 0.00–0.07)
Basophils Absolute: 0 10*3/uL (ref 0.0–0.1)
Basophils Relative: 1 %
Eosinophils Absolute: 0 10*3/uL (ref 0.0–0.5)
Eosinophils Relative: 0 %
HCT: 46.4 % (ref 39.0–52.0)
Hemoglobin: 15.5 g/dL (ref 13.0–17.0)
Immature Granulocytes: 1 %
Lymphocytes Relative: 22 %
Lymphs Abs: 1.8 10*3/uL (ref 0.7–4.0)
MCH: 28.7 pg (ref 26.0–34.0)
MCHC: 33.4 g/dL (ref 30.0–36.0)
MCV: 85.8 fL (ref 80.0–100.0)
Monocytes Absolute: 0.8 10*3/uL (ref 0.1–1.0)
Monocytes Relative: 9 %
Neutro Abs: 5.7 10*3/uL (ref 1.7–7.7)
Neutrophils Relative %: 67 %
Platelets: 439 10*3/uL — ABNORMAL HIGH (ref 150–400)
RBC: 5.41 MIL/uL (ref 4.22–5.81)
RDW: 15.2 % (ref 11.5–15.5)
WBC: 8.4 10*3/uL (ref 4.0–10.5)
nRBC: 0 % (ref 0.0–0.2)

## 2020-07-20 LAB — BASIC METABOLIC PANEL
Anion gap: 11 (ref 5–15)
BUN: 14 mg/dL (ref 6–20)
CO2: 27 mmol/L (ref 22–32)
Calcium: 9.8 mg/dL (ref 8.9–10.3)
Chloride: 100 mmol/L (ref 98–111)
Creatinine, Ser: 1.25 mg/dL — ABNORMAL HIGH (ref 0.61–1.24)
GFR, Estimated: >60 mL/min (ref >60)
Glucose, Bld: 101 mg/dL — ABNORMAL HIGH (ref 70–99)
Potassium: 3.9 mmol/L (ref 3.5–5.1)
Sodium: 138 mmol/L (ref 135–145)

## 2020-07-20 LAB — RESPIRATORY PANEL BY RT PCR (FLU A&B, COVID)
Influenza A by PCR: NEGATIVE
Influenza B by PCR: NEGATIVE
SARS Coronavirus 2 by RT PCR: NEGATIVE

## 2020-07-20 IMAGING — CT CT HEAD W/O CM
3 series · 15 of 47 positions shown, 18 images · non-contrast
Comparison: [DATE] head CT.

CLINICAL DATA: Dizziness

EXAM:
CT HEAD WITHOUT CONTRAST
TECHNIQUE: Contiguous axial images were obtained from the base of the skull
through the vertex without intravenous contrast.

[Series 2: head wo · axial · 0.43mm/px · z∈[+883,+1008]mm · 9 of 31 slices shown, 12 images]
[im 3/31  brain]
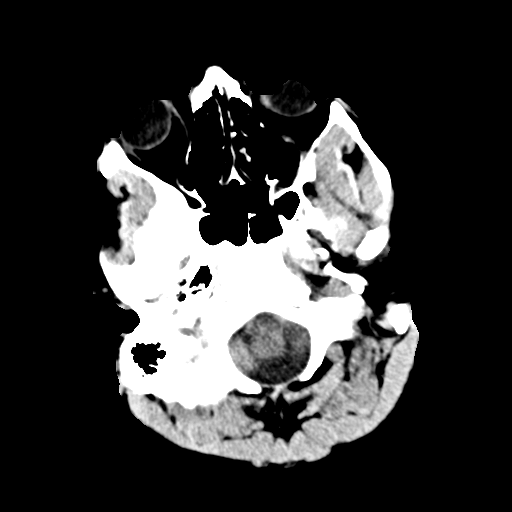
[im 3/31  bone]
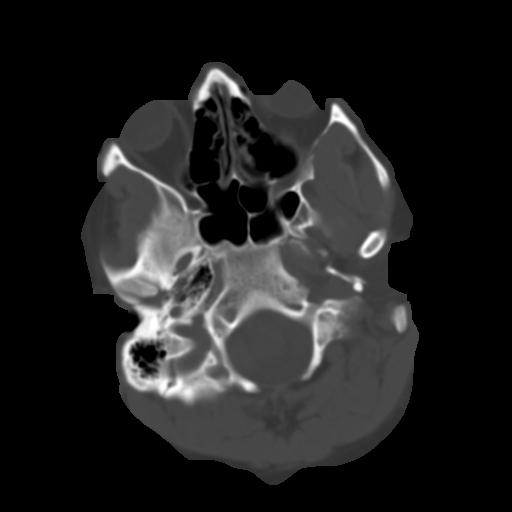
[im 6/31  brain]
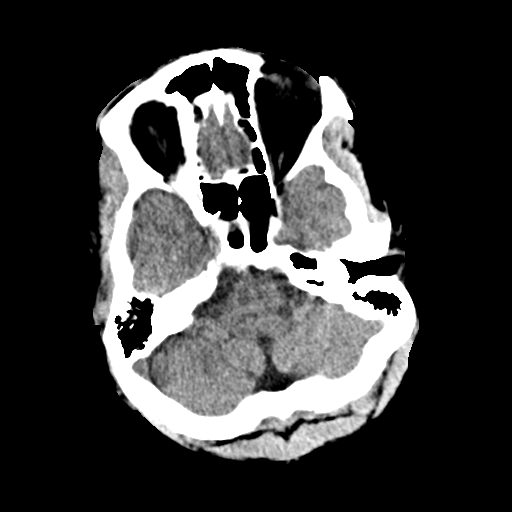
[im 9/31  brain]
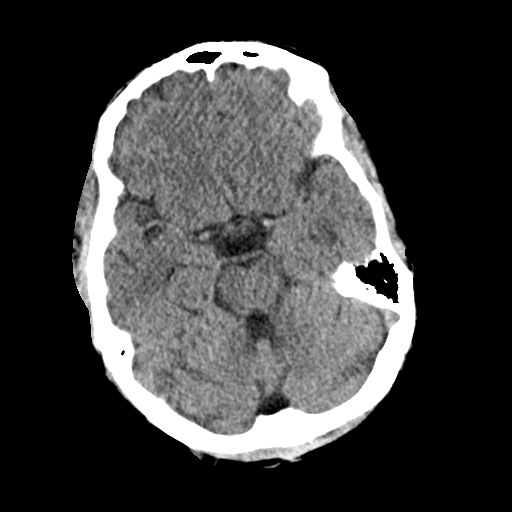
[im 12/31  brain]
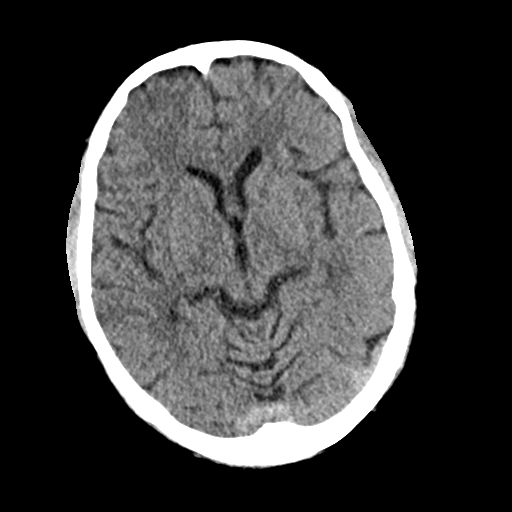
[im 16/31  brain]
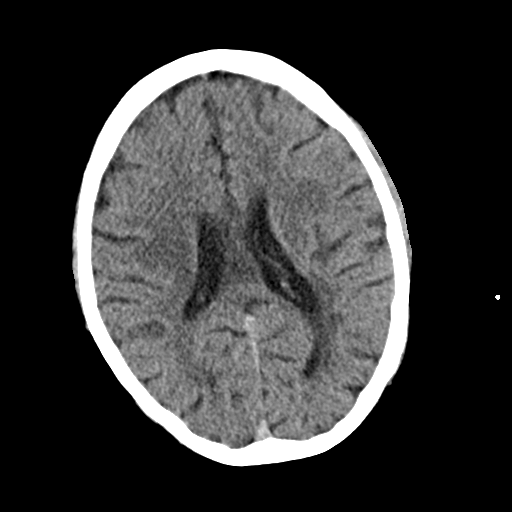
[im 16/31  bone]
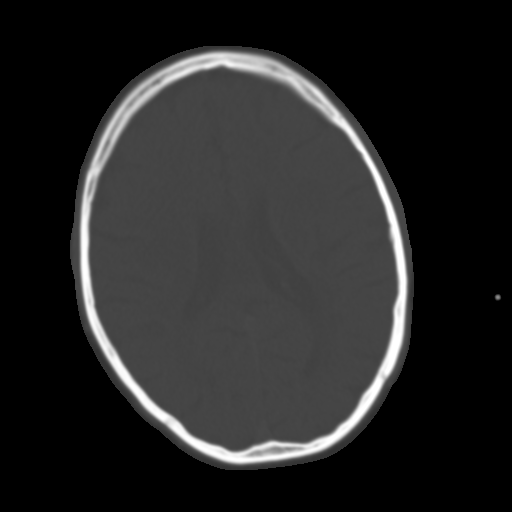
[im 19/31  brain]
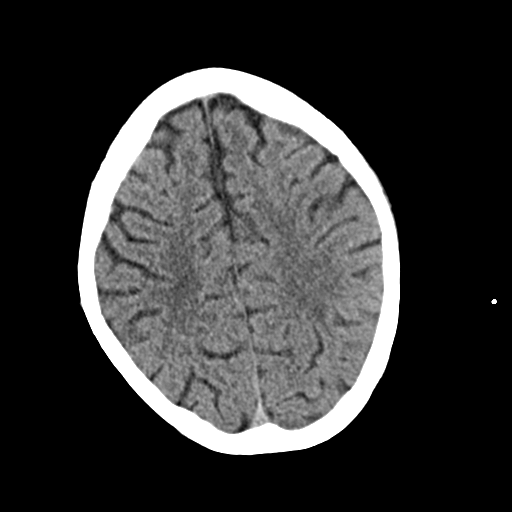
[im 22/31  brain]
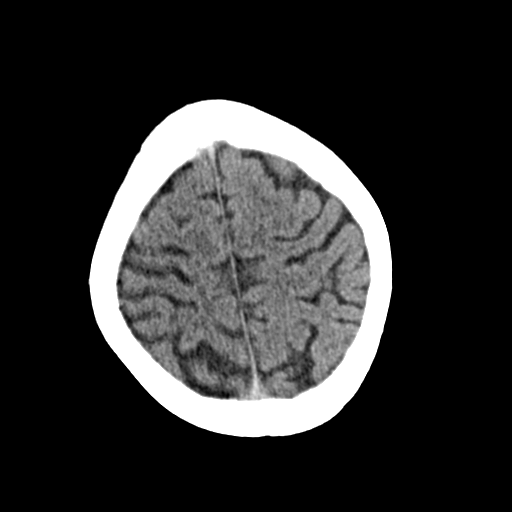
[im 25/31  brain]
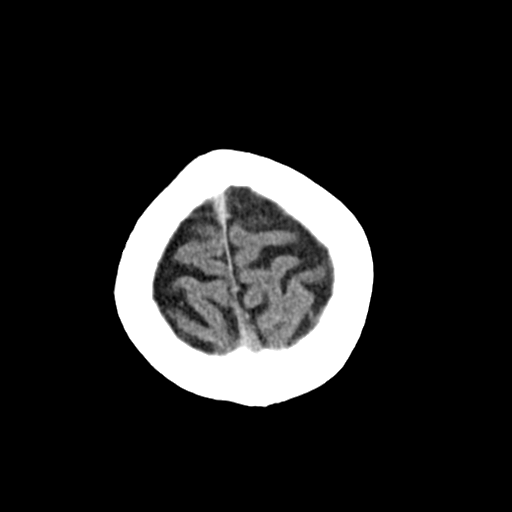
[im 28/31  brain]
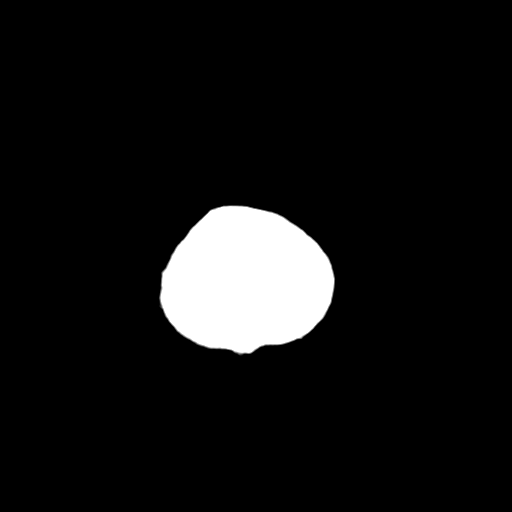
[im 28/31  bone]
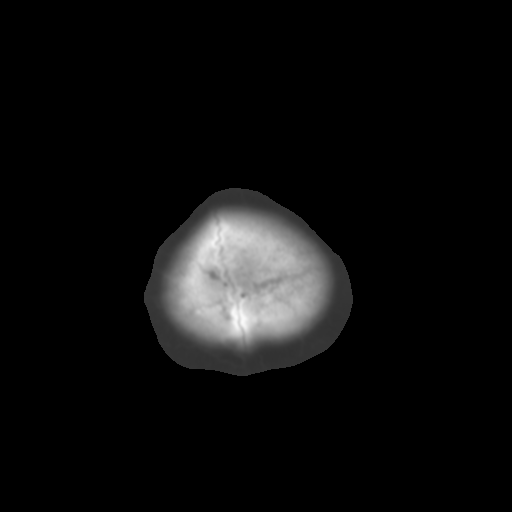

[Series 4: coronal soft · coronal · 0.32mm/px · 3 of 74 slices shown]
[im 25/74  brain]
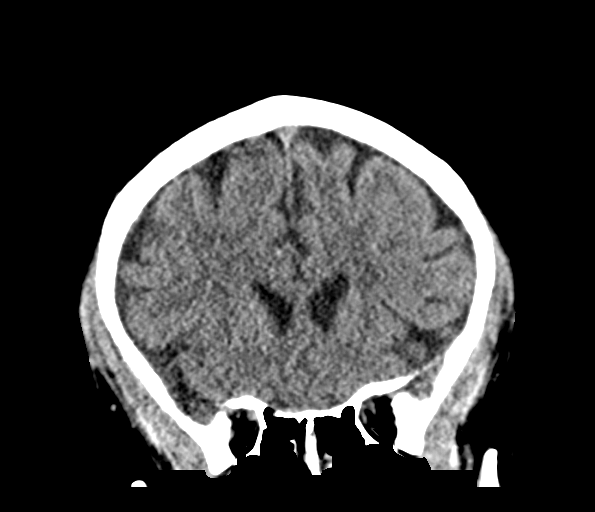
[im 33/74  brain]
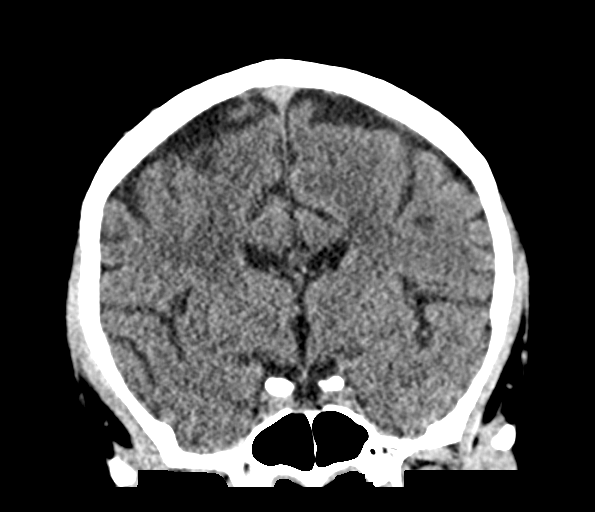
[im 41/74  brain]
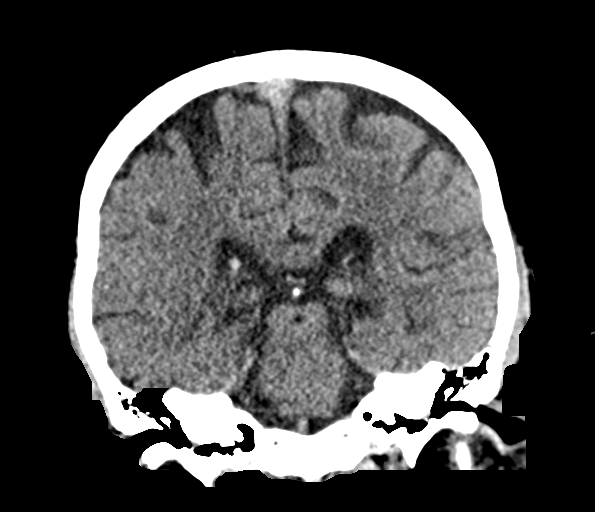

[Series 5: sag soft · sagittal · 0.31mm/px · 3 of 67 slices shown]
[im 23/67  brain]
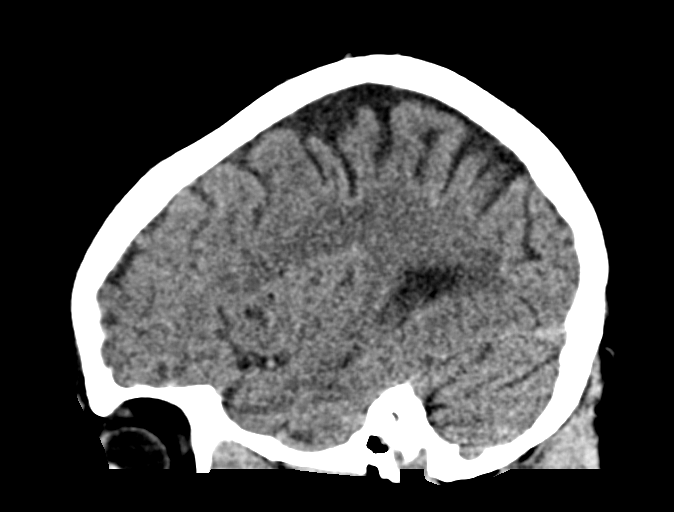
[im 34/67  brain]
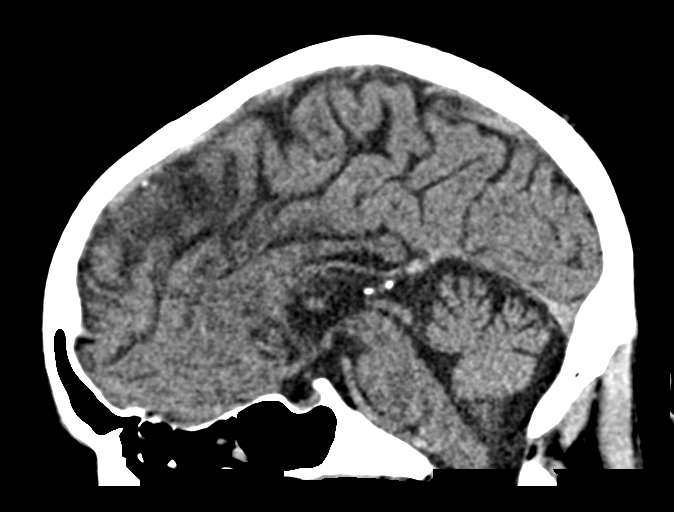
[im 45/67  brain]
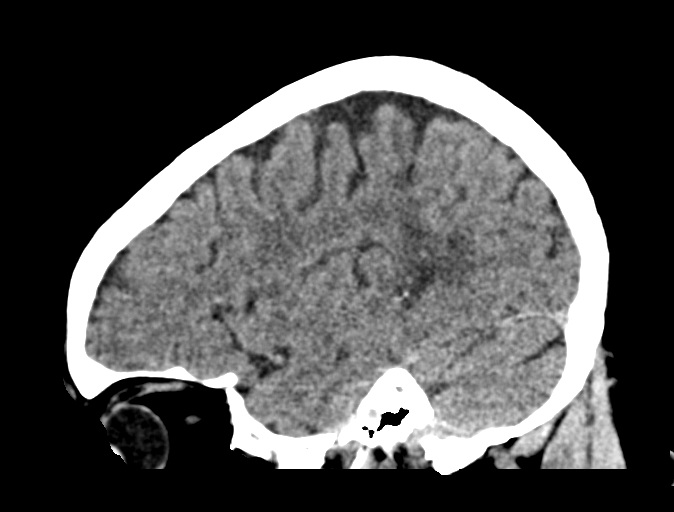

[15 of 47 positions shown; findings below may reference images not displayed]

FINDINGS: Brain: No intracranial hemorrhage. No mass lesion. No midline shift,
ventriculomegaly or extra-axial fluid collection. Cerebral volume is
within normal limits. Bilateral periatrial white matter
hypodensities.

Vascular: No hyperdense vessel or unexpected calcification.

Skull: Negative for fracture or focal lesion.

Sinuses/Orbits: Normal orbits. Clear paranasal sinuses. No mastoid
effusion.

Other: None.
IMPRESSION: Nonspecific bilateral periatrial white matter hypodensities.
Differential includes demyelination, age indeterminate insults, post
infectious/inflammatory sequela and vasculopathy.

MRI brain with and without contrast is recommended.

## 2020-07-20 IMAGING — MR MR HEAD WO/W CM
12 of 14 series · 40 of 48 positions shown · IV contrast (gadavist)
Comparison: Head CT [G0] hours.

CLINICAL DATA: 33-year-old male with new dizziness. Abnormal
appearance of the cerebral white matter on head CT earlier today.

EXAM:
MRI HEAD WITHOUT AND WITH CONTRAST
TECHNIQUE: Multiplanar, multiecho pulse sequences of the brain and surrounding
structures were obtained without and with intravenous contrast.
CONTRAST:  10mL GADAVIST GADOBUTROL 1 MMOL/ML IV SOLN

[Series 5: DWI · axial · 3.0mm · 0.88mm/px · z∈[-108,+38]mm · 8 of 100 slices shown (1 of 4)]
[im 1/100]
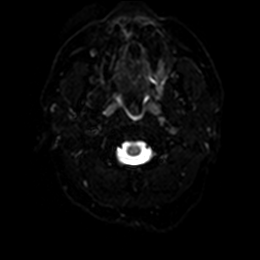
[im 15/100]
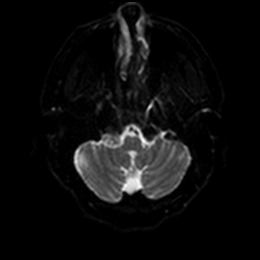
[im 29/100]
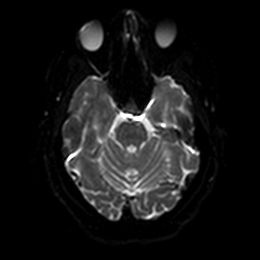
[im 43/100]
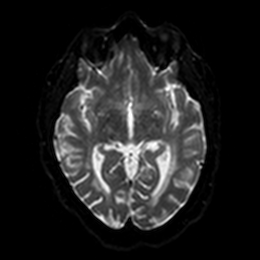
[im 57/100]
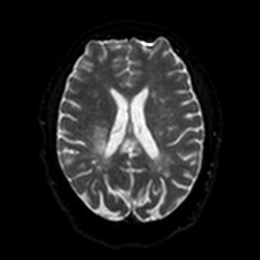
[im 71/100]
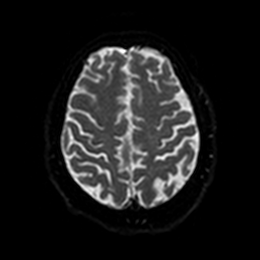
[im 85/100]
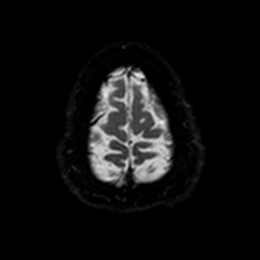
[im 100/100]
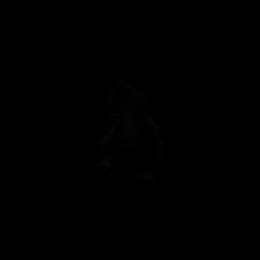

[Series 6: DWI · axial · 3.0mm · 0.88mm/px · z∈[-108,+38]mm · 4 of 50 slices shown (2 of 4)]
[im 1/50]
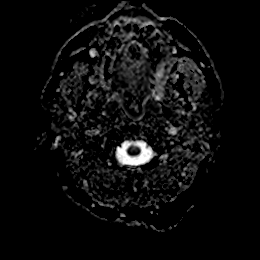
[im 17/50]
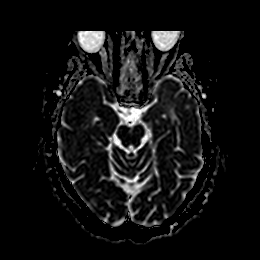
[im 33/50]
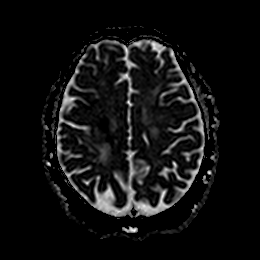
[im 50/50]
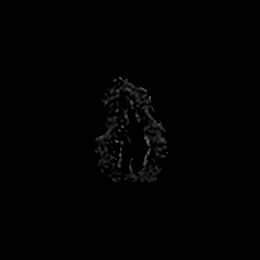

[Series 7: DWI · coronal · 4.0mm · 0.88mm/px · 5 of 72 slices shown (3 of 4)]
[im 1/72]
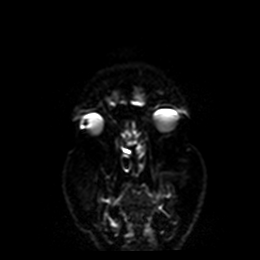
[im 18/72]
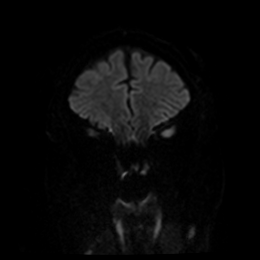
[im 36/72]
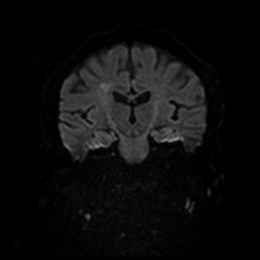
[im 54/72]
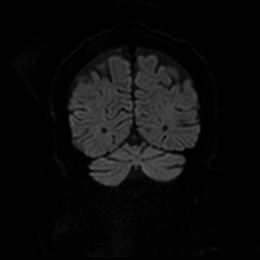
[im 72/72]
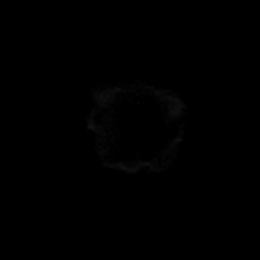

[Series 8: DWI · coronal · 4.0mm · 0.88mm/px · 3 of 36 slices shown (4 of 4)]
[im 1/36]
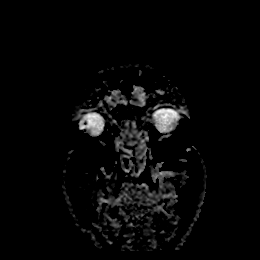
[im 18/36]
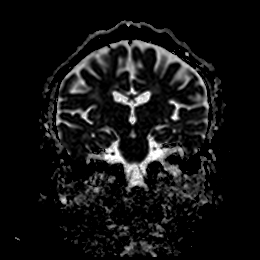
[im 36/36]
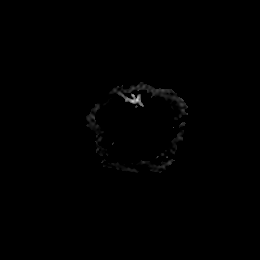

[Series 9: FLAIR · axial · 5.0mm · 0.45mm/px · z∈[-107,+37]mm · 2 of 25 slices shown]
[im 1/25]
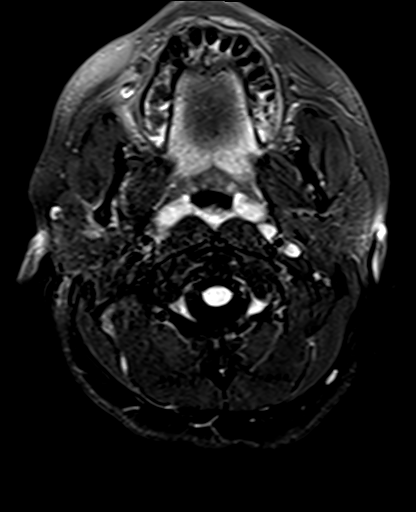
[im 25/25]
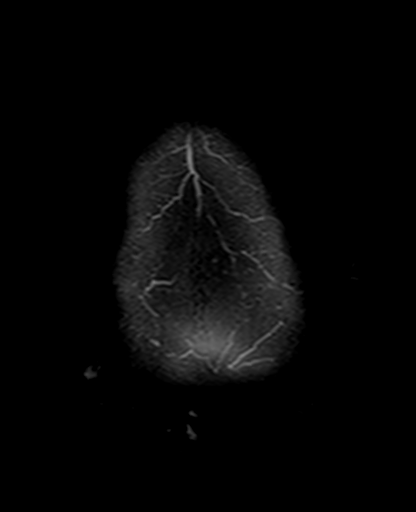

[Series 10: T1 · sagittal · 5.0mm · 0.75mm/px · 2 of 25 slices shown]
[im 1/25]
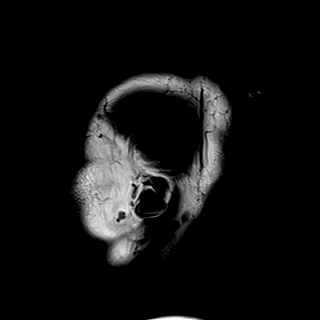
[im 25/25]
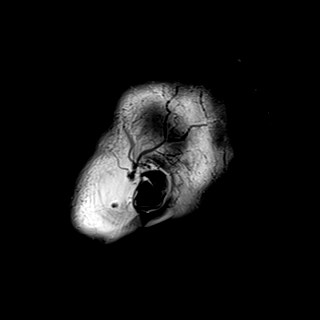

[Series 11: T2 · axial · 5.0mm · 0.72mm/px · z∈[-107,+36]mm · 2 of 25 slices shown]
[im 1/25]
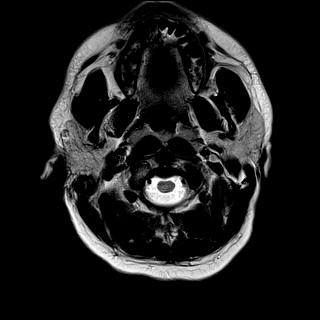
[im 25/25]
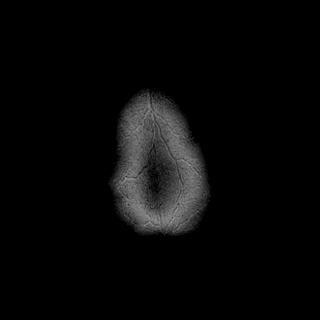

[Series 13: pha_images · axial · 3.0mm · 0.90mm/px · z∈[-116,+48]mm · 4 of 56 slices shown]
[im 1/56]
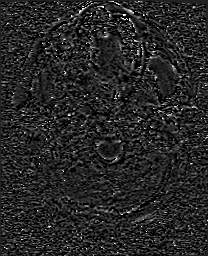
[im 19/56]
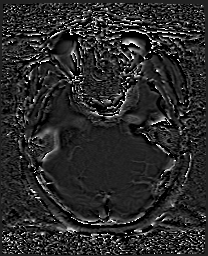
[im 37/56]
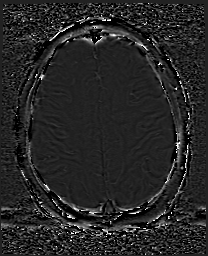
[im 56/56]
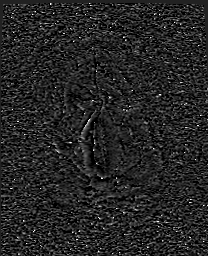

[Series 14: swi_images · axial · 3.0mm · 0.90mm/px · z∈[-116,+48]mm · 4 of 56 slices shown]
[im 1/56]
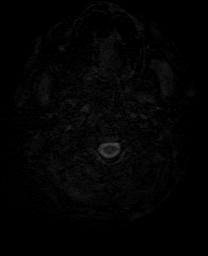
[im 19/56]
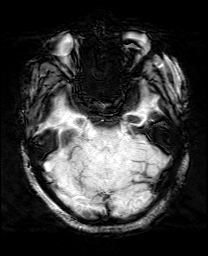
[im 37/56]
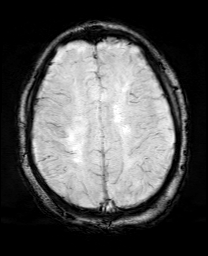
[im 56/56]
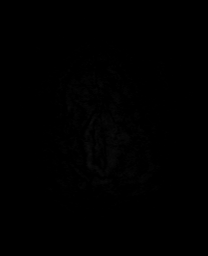

[Series 18: T2 post-contrast · coronal · 5.0mm · 0.72mm/px · 2 of 30 slices shown]
[im 1/30]
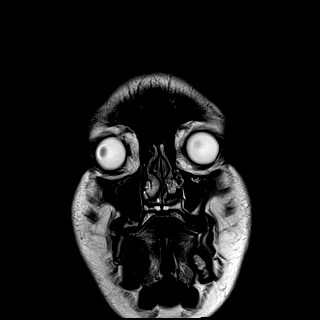
[im 30/30]
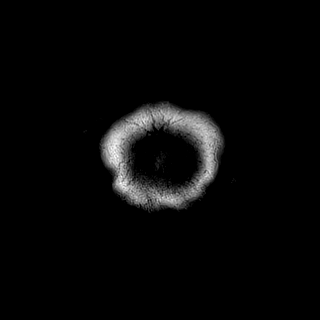

[Series 20: T1 post-contrast · coronal · 5.0mm · 0.34mm/px · 2 of 30 slices shown (1 of 2)]
[im 1/30]
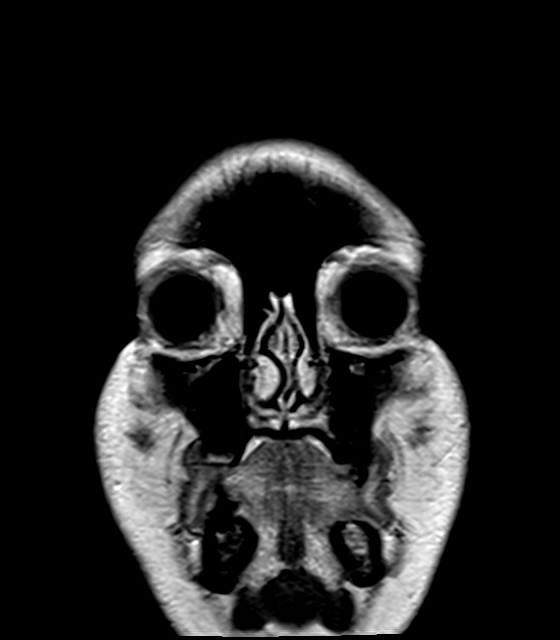
[im 30/30]
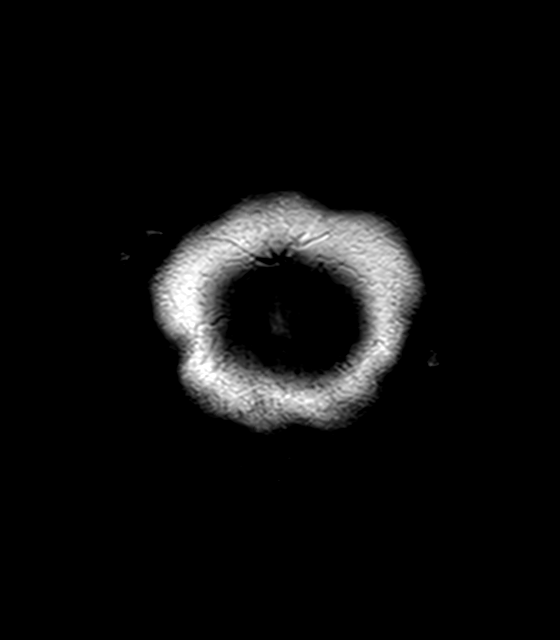

[Series 21: T1 post-contrast · sagittal · 5.0mm · 0.75mm/px · 2 of 25 slices shown (2 of 2)]
[im 1/25]
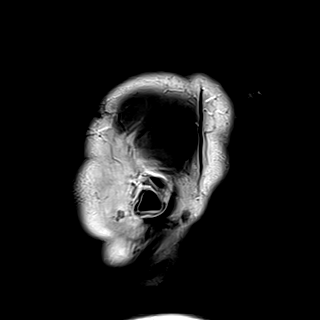
[im 25/25]
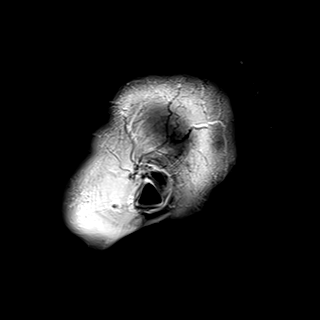

[40 of 48 positions shown; findings below may reference images not displayed]

FINDINGS: Brain: Patchy and confluent abnormal T2 and FLAIR hyperintensity in
the bilateral periventricular white matter. Temporal lobe
involvement greater on the left. Corpus callosum volume loss is
associated.

White Matter lesions in the right corona radiata and centrum
semiovale demonstrate patchy diffusion restriction (series 5, image
83) and indistinct petechial and leading edge type enhancement
(series 19 images 32 through 34).

No other diffusion restricted or enhancing lesions, but there is
also advanced involvement of the right cerebellar peduncle and
adjacent brainstem (series 11, image 6 and series 9, image 6).

No cortical involvement identified. The corona radiata involvement
does track toward the posterior limbs of the internal capsules on
both sides. The deep gray nuclei appear spared.

No superimposed restricted diffusion suggestive of acute infarction.
No midline shift, mass effect, evidence of mass lesion,
ventriculomegaly, extra-axial collection or acute intracranial
hemorrhage. No chronic cerebral blood products. Cervicomedullary
junction and pituitary are within normal limits. No other abnormal
enhancement. No dural thickening identified.

Vascular: Major intracranial vascular flow voids are preserved. The
major dural venous sinuses are enhancing and appear to be patent.

Skull and upper cervical spine: Grossly normal visible cervical
spine, spinal cord. Visualized bone marrow signal is within normal
limits.

Sinuses/Orbits: Mildly Disconjugate gaze but otherwise negative
orbits. Paranasal sinuses and mastoids are stable and well
pneumatized.

Other: Visible internal auditory structures appear normal. Scalp and
face appear negative.
IMPRESSION: Multifocal signal abnormality in the brain most compatible with
acute on chronic demyelinating disease.
Active demyelination in the right hemisphere white matter.

Conspicuous chronic involvement of the right brainstem and
cerebellar peduncle.

## 2020-07-20 MED ORDER — ACETAMINOPHEN 650 MG RE SUPP: 650.0000 mg | Freq: Four times a day (QID) | RECTAL | Status: DC | PRN

## 2020-07-20 MED ORDER — ONDANSETRON HCL 4 MG/2ML IJ SOLN: 4.0000 mg | Freq: Four times a day (QID) | INTRAMUSCULAR | Status: DC | PRN

## 2020-07-20 MED ORDER — SODIUM CHLORIDE 0.9 % IV BOLUS
1000.0000 mL | Freq: Once | INTRAVENOUS | Status: AC
  Administered 2020-07-20: 1000 mL via INTRAVENOUS

## 2020-07-20 MED ORDER — SODIUM CHLORIDE 0.45 % IV SOLN: INTRAVENOUS | Status: DC

## 2020-07-20 MED ORDER — ONDANSETRON HCL 4 MG PO TABS: 4.0000 mg | ORAL_TABLET | Freq: Four times a day (QID) | ORAL | Status: DC | PRN

## 2020-07-20 MED ORDER — ENOXAPARIN SODIUM 40 MG/0.4ML ~~LOC~~ SOLN
40.0000 mg | SUBCUTANEOUS | Status: DC
  Administered 2020-07-21: 40 mg via SUBCUTANEOUS
  Filled 2020-07-20: qty 0.4

## 2020-07-20 MED ORDER — SODIUM CHLORIDE 0.9 % IV SOLN
1000.0000 mg | Freq: Once | INTRAVENOUS | Status: AC
  Administered 2020-07-20: 1000 mg via INTRAVENOUS
  Filled 2020-07-20: qty 8

## 2020-07-20 MED ORDER — GADOBUTROL 1 MMOL/ML IV SOLN
10.0000 mL | Freq: Once | INTRAVENOUS | Status: AC | PRN
  Administered 2020-07-20: 10 mL via INTRAVENOUS

## 2020-07-20 MED ORDER — ACETAMINOPHEN 325 MG PO TABS: 650.0000 mg | ORAL_TABLET | Freq: Four times a day (QID) | ORAL | Status: DC | PRN

## 2020-07-20 MED ORDER — MECLIZINE HCL 25 MG PO TABS
25.0000 mg | ORAL_TABLET | Freq: Once | ORAL | Status: AC
  Administered 2020-07-20: 25 mg via ORAL
  Filled 2020-07-20: qty 1

## 2020-07-20 NOTE — ED Provider Notes (Signed)
[facility] EMERGENCY DEPARTMENT Provider Note   CSN: [id number] Arrival date & time: 07/20/20  1216     History Chief Complaint  Patient presents with  . Dizziness  . Ear Fullness    Cody Rodriguez is a 34 y.o. male with PMHx HTN who presents to the ED today with complaint of fullness to bilateral ears x 3 days. Pt also reports both room spinning dizziness/feeling off balanced. He has also had nausea and 2 episodes of NBNB emesis which were brought on with dizziness. He states the dizziness is exacerbated with sudden movements. Pt reports he had similar issues of dizziness in the past however cannot recall what it was called. Pt has not taken anything for his symptoms.   Per chart review: Pt was being seen by neurology in the past for numbness and tingling in hands and feet as well as right arm weakness that resolved with steroids. There was concern for MS and neurology wanted to get brain MRI. Unable to see in our system if this was completed however appears that pt was having financial issues at that time.   A/P at last neurology visit on 10/25/2015: Assessment/Plan: 34 year old male who originally was seen for worsening paresthesias in the extremities and thorax since July - since resolved with Neurontin. Neuro exam was significant for Patchy sensory loss in the thorax from t2-t8 (resolved), he had a sensation like a belt around his upper thorax (states resolved). Was given prednisone which made him feel better. Today he returns after he had an episode of acute onset numbness, pain, burning on the right side of the head, stiff neck, numb and tingly in the right arm with weakness of right arm. . Symptoms resolved with steroids again. Patient is having discrete neurologic events that resolve quickly with steroids. I order his imaging again today, we really need to rule out MS or other central disorder. He has insurance now, hopefully we can get it done.  Neurontin tid prin.  Encouraged smoking cessation.   The history is provided by the patient and medical records.       Past Medical History:  Diagnosis Date  . High blood pressure   . Low back pain   . Paresthesia and pain of both upper extremities    also in right leg  . Swelling     Patient Active Problem List   Diagnosis Date Noted  . Transaminitis 11/25/2019  . Rhabdomyolysis 11/24/2019  . Paresthesia and pain of both upper extremities   . Current smoker 07/02/2014  . Paresthesias 07/02/2014  . Weakness 07/02/2014  . Gynecomastia, male 07/02/2014    Past Surgical History:  Procedure Laterality Date  . FINGER SURGERY Left    left pinky  . ROTATOR CUFF REPAIR Left        Family History  Problem Relation Age of Onset  . Healthy Mother   . Diabetes Mother   . Hypertension Mother   . Hyperlipidemia Mother   . Sleep apnea Mother   . Hypertension Father   . Hyperlipidemia Father   . Healthy Sister   . Healthy Brother   . Diabetes Maternal Aunt   . Neuropathy Neg Hx   . Multiple sclerosis Neg Hx     Social History   Tobacco Use  . Smoking status: Former Smoker    Packs/day: 0.50    Years: 10.00    Pack years: 5.00    Types: Cigarettes  . Smokeless tobacco: Never Used  .  Tobacco comment: 10/25/15 cut back a little  Substance Use Topics  . Alcohol use: Yes    Alcohol/week: 0.0 standard drinks    Comment: 1x month  . Drug use: No    Home Medications Prior to Admission medications   Medication Sig Start Date End Date Taking? Authorizing Provider  Multiple Vitamin (MULTIVITAMIN) capsule Take 1 capsule by mouth daily.   Yes [provider]  naproxen (NAPROSYN) 500 MG tablet Take 500 mg by mouth 2 (two) times daily with a meal.    [provider]    Allergies    Patient has no known allergies.  Review of Systems   Review of Systems  Constitutional: Negative for chills and fever.  Eyes: Negative for visual disturbance.  Gastrointestinal: Positive for  nausea and vomiting.  Musculoskeletal: Positive for gait problem.  Neurological: Positive for dizziness. Negative for weakness and numbness.  All other systems reviewed and are negative.   Physical Exam Updated Vital Signs BP (!) 135/96   Pulse 94   Temp 98.6 F (37 C) (Oral)   Resp 20   Ht 5\' 10"  (1.778 m)   Wt 116.1 kg   SpO2 99%   BMI 36.73 kg/m   Physical Exam Vitals and nursing note reviewed.  Constitutional:      Appearance: He is not ill-appearing or diaphoretic.  HENT:     Head: Normocephalic and atraumatic.     Right Ear: Tympanic membrane and ear canal normal. There is no impacted cerumen.     Left Ear: Tympanic membrane and ear canal normal. There is no impacted cerumen.  Eyes:     Extraocular Movements: Extraocular movements intact.     Conjunctiva/sclera: Conjunctivae normal.     Pupils: Pupils are equal, round, and reactive to light.     Comments: Very mild horizontal nystagmus  Cardiovascular:     Rate and Rhythm: Normal rate and regular rhythm.     Pulses: Normal pulses.  Pulmonary:     Effort: Pulmonary effort is normal.     Breath sounds: Normal breath sounds. No wheezing, rhonchi or rales.  Abdominal:     Palpations: Abdomen is soft.     Tenderness: There is no abdominal tenderness. There is no guarding or rebound.  Musculoskeletal:     Cervical back: Neck supple.  Skin:    General: Skin is warm and dry.  Neurological:     Mental Status: He is alert.     Comments: CN 3-12 grossly intact A&O x4 GCS 15 Sensation and strength intact to BUE and BLEs + Romberg + Ataxic gait Negative pronator drift Coordination with finger-to-nose WNL      ED Results / Procedures / Treatments   Labs (all labs ordered are listed, but only abnormal results are displayed) Labs Reviewed  BASIC METABOLIC PANEL - Abnormal; Notable for the following components:      Result Value   Glucose, Bld 101 (*)    Creatinine, Ser 1.25 (*)    All other components within  normal limits  CBC WITH DIFFERENTIAL/PLATELET - Abnormal; Notable for the following components:   Platelets 439 (*)    All other components within normal limits    EKG EKG Interpretation  Date/Time:  Friday July 20 2020 12:25:25 EDT Ventricular Rate:  90 PR Interval:  134 QRS Duration: 82 QT Interval:  362 QTC Calculation: 442 R Axis:   56 Text Interpretation: Normal sinus rhythm Normal ECG Since last tracing rate slower Confirmed by 02[birth year]21[birth year]1982 [phone])  on 07/20/2020 12:37:44 PM   Radiology CT Head Wo Contrast  Result Date: 07/20/2020 CLINICAL DATA:  Dizziness EXAM: CT HEAD WITHOUT CONTRAST TECHNIQUE: Contiguous axial images were obtained from the base of the skull through the vertex without intravenous contrast. COMPARISON:  05/12/2017 head CT. FINDINGS: Brain: No intracranial hemorrhage. No mass lesion. No midline shift, ventriculomegaly or extra-axial fluid collection. Cerebral volume is within normal limits. Bilateral periatrial white matter hypodensities. Vascular: No hyperdense vessel or unexpected calcification. Skull: Negative for fracture or focal lesion. Sinuses/Orbits: Normal orbits. Clear paranasal sinuses. No mastoid effusion. Other: None. IMPRESSION: Nonspecific bilateral periatrial white matter hypodensities. Differential includes demyelination, age indeterminate insults, post infectious/inflammatory sequela and vasculopathy. MRI brain with and without contrast is recommended. Electronically Signed   By: Stana Bunting M.D.   On: 07/20/2020 14:47    Procedures Procedures (including critical care time)  Medications Ordered in ED Medications  meclizine (ANTIVERT) tablet 25 mg (25 mg Oral Given 07/20/20 1424)  sodium chloride 0.9 % bolus 1,000 mL (1,000 mLs Intravenous New Bag/Given 07/20/20 1528)    ED Course  I have reviewed the triage vital signs and the nursing notes.  Pertinent labs & imaging results that were available during my care of the patient were  reviewed by me and considered in my medical decision making (see chart for details).    MDM Rules/Calculators/A&P                          34 year old male who presents to the ED today with complaint of ear fullness, room spinning dizziness, lightheadedness/feeling off balanced for the past 3 to 4 days.  He reports history of CAD however cannot recall what his diagnosis was.  Per chart review it does appear patient had a constellation of neurological symptoms in the past with concern for MS.  He was seen by neurologist Dr. Daisy Blossom who wanted to do an MRI of the brain however patient states he never had this done due to financial issues.  He has not seen Dr. Daisy Blossom since 2017.  On arrival to the ED patient is afebrile, nontachycardic and nontachypneic.  He is laying very still in the bed is reports worsening symptoms with any movement.  He has a very mild horizontal nystagmus on exam.  His TMs are clear and no signs of fluid behind the TM to cause ear fullness.  His hearing is intact.  He has mild cranial nerves however attempted to ambulate patient and he is very unsteady on his feet with a positive Romberg and ataxic gait.  Question if symptoms are related to vertigo versus concern for MS.  Unfortunately we do not have MRI capability at this facility.  We will plan for lab work, orthostatics, CT head, meclizine for symptomatic relief and reassessment.  Patient may need to be transferred to another facility for MRI. Have discussed this with attending physician Dr. Lynelle Doctor who agrees with plan.   BP- Lying:101/43 Pulse- Lying:61 BP- Sitting:137/93 (!) Pulse- Sitting:108 BP- Standing at 0 minutes:127/91 (!) Pulse- Standing at 0 minutes:102 BP- Standing at 3 minutes:135/102 (!) Pulse- Standing at 3 minutes:110 Fluids provided  CT head IMPRESSION:  Nonspecific bilateral periatrial white matter hypodensities.  Differential includes demyelination, age indeterminate insults, post    infectious/inflammatory sequela and vasculopathy.    MRI brain with and without contrast is recommended.   CBC without leukocytosis. Hgb stable at 15.5.  BMP with glucose 101 and creatinine 1.25 which is mildly elevated compared  to baseline. Fluids ordered.   Lab Results  Component Value Date   CREATININE 1.25 (H) 07/20/2020   CREATININE 1.18 07/12/2020   CREATININE 0.99 11/27/2019   Have discussed case with Dr. Rhunette Croft at [facility] who agrees to accept pt for ED to ED transfer. Pt will go POV as wife is driving him. The MRI has been ordered in case there is a long wait time.   This note was prepared using Dragon voice recognition software and may include unintentional dictation errors due to the inherent limitations of voice recognition software.  Final Clinical Impression(s) / ED Diagnoses Final diagnoses:  Dizziness  Unsteady gait  Abnormal CT of brain    Rx / DC Orders ED Discharge Orders    None       Discharge Instructions     PLEASE GO DIRECTLY TO [facility] EMERGENCY DEPARTMENT FOR MRI OF YOUR BRAIN.        Tanda Rockers, PA-C 07/20/20 1603    Linwood Dibbles, MD 07/20/20 [phone]

## 2020-07-20 NOTE — ED Notes (Signed)
PT LEFT WITH 20G IV IN RAC

## 2020-07-20 NOTE — H&P (Signed)
History and Physical    Cody Rodriguez NOB:[id number] DOB: 06/11/1986 DOA: 07/20/2020  PCP: System, Provider Not In   Patient coming from: Home.   I have personally briefly reviewed patient's old medical records in [facility]  Chief Complaint: Dizziness, nausea head/ear stuffiness.  HPI: Cody Rodriguez is a 34 y.o. male with medical history significant of gynecomastia, hypertension, chronic lower back pain, rhabdomyolysis, history of upper extremities and RLE paresthesias who is coming to the emergency department due to above chief complaint for the past 3 days.  He has had these symptoms for more than 4 years and he was seen by Dr. Lucia Gaskins in January of 2017, but was unable to proceed with work-up due to lack of health insurance.  Since then, he has had them on and off, but the past few days have been more intense.  Patient has been having some trouble with his balance and ambulation.  He has also have a left-sided frontal headache and blurred vision since Today.  He has also been very tired.  He also complains of renal area, mild sore throat with dry cough.  No chest pain, palpitations, dyspnea, diaphoresis, PND, orthopnea or pitting edema of the lower extremities.  Denies abdominal pain, diarrhea, constipation, melena or hematochezia.  No dysuria, frequency or hematuria.   ED Course: Initial vital signs were temperature 98.6 F, pulse 94, respiration 20, BP 135/96 mmHg O2 sat 99% on room air.  The patient was given a 1000 mL NS bolus, 25 mg of meclizine by the ED providers and 1 g of methylprednisolone was ordered by Dr. Amada Jupiter.  Coronavirus PCR was negative.  CBC shows white count 8.4, moment 15.5 g/dL platelets 947.  BMP shows normal electrolytes.  His glucose 101 and creatinine 1.25 mg/dL.  GFR is more than 60 mL/min.  Brain imaging showing white matter hypodensities.  Please see radiology reports below.  Review of Systems: As per HPI otherwise all other systems reviewed and are  negative.  Past Medical History:  Diagnosis Date  . Gynecomastia, male 07/02/2014  . High blood pressure   . Low back pain   . Paresthesia and pain of both upper extremities    also in right leg  . Rhabdomyolysis 11/24/2019  . Swelling     Past Surgical History:  Procedure Laterality Date  . FINGER SURGERY Left    left pinky  . ROTATOR CUFF REPAIR Left     Social History  reports that he has quit smoking. His smoking use included cigarettes. He has a 5.00 pack-year smoking history. He has never used smokeless tobacco. He reports current alcohol use. He reports that he does not use drugs.  No Known Allergies  Family History  Problem Relation Age of Onset  . Healthy Mother   . Diabetes Mother   . Hypertension Mother   . Hyperlipidemia Mother   . Sleep apnea Mother   . Hypertension Father   . Hyperlipidemia Father   . Healthy Sister   . Healthy Brother   . Diabetes Maternal Aunt   . Neuropathy Neg Hx   . Multiple sclerosis Neg Hx    Prior to Admission medications   Medication Sig Start Date End Date Taking? Authorizing Provider  Multiple Vitamin (MULTIVITAMIN) capsule Take 1 capsule by mouth daily.   Yes [provider]  naproxen (NAPROSYN) 500 MG tablet Take 500 mg by mouth 2 (two) times daily with a meal.    [provider]    Physical Exam: Vitals:  07/20/20 1225 07/20/20 1411 07/20/20 1612 07/20/20 2123  BP: (!) 135/96 (!) 101/43 131/73 (!) 164/91  Pulse: 94 82 82 81  Resp: 20 20 18    Temp: 98.6 F (37 C) 98.5 F (36.9 C) 98.4 F (36.9 C) 98.1 F (36.7 C)  TempSrc: Oral Oral  Oral  SpO2: 99% 100% 100% 100%  Weight:      Height:       Constitutional: NAD, calm, comfortable Eyes: PERRL, lids and conjunctivae normal ENMT: Mucous membranes are mildly dry.. Posterior pharynx clear of any exudate or lesions. Neck: normal, supple, no masses, no thyromegaly Respiratory: clear to auscultation bilaterally, no wheezing, no crackles. Normal  respiratory effort. No accessory muscle use.  Cardiovascular: Regular rate and rhythm, no murmurs / rubs / gallops. No extremity edema. 2+ pedal pulses. No carotid bruits.  Abdomen: Obese, nondistended.  BS positive, soft, no tenderness, no masses palpated. No hepatosplenomegaly. Musculoskeletal: no clubbing / cyanosis. Good ROM, no contractures. Normal muscle tone.  Skin: no rashes, lesions, ulcers on limited dermatological examination. Neurologic: CN 2-12 grossly intact. Sensation intact, DTR normal.  He shows significant ataxia on right side extremities. Psychiatric: Normal judgment and insight. Alert and oriented x 3. Normal mood.   Labs on Admission: I have personally reviewed following labs and imaging studies  CBC: Recent Labs  Lab 07/20/20 1455  WBC 8.4  NEUTROABS 5.7  HGB 15.5  HCT 46.4  MCV 85.8  PLT 439*    Basic Metabolic Panel: Recent Labs  Lab 07/20/20 1455  NA 138  K 3.9  CL 100  CO2 27  GLUCOSE 101*  BUN 14  CREATININE 1.25*  CALCIUM 9.8    GFR: Estimated Creatinine Clearance: 106.2 mL/min (A) (by C-G formula based on SCr of 1.25 mg/dL (H)).  Liver Function Tests: No results for input(s): AST, ALT, ALKPHOS, BILITOT, PROT, ALBUMIN in the last 168 hours.  Radiological Exams on Admission: CT Head Wo Contrast  Result Date: 07/20/2020 CLINICAL DATA:  Dizziness EXAM: CT HEAD WITHOUT CONTRAST TECHNIQUE: Contiguous axial images were obtained from the base of the skull through the vertex without intravenous contrast. COMPARISON:  05/12/2017 head CT. FINDINGS: Brain: No intracranial hemorrhage. No mass lesion. No midline shift, ventriculomegaly or extra-axial fluid collection. Cerebral volume is within normal limits. Bilateral periatrial white matter hypodensities. Vascular: No hyperdense vessel or unexpected calcification. Skull: Negative for fracture or focal lesion. Sinuses/Orbits: Normal orbits. Clear paranasal sinuses. No mastoid effusion. Other: None.  IMPRESSION: Nonspecific bilateral periatrial white matter hypodensities. Differential includes demyelination, age indeterminate insults, post infectious/inflammatory sequela and vasculopathy. MRI brain with and without contrast is recommended. Electronically Signed   By: 07/12/2017 M.D.   On: 07/20/2020 14:47   MR Brain W and Wo Contrast  Result Date: 07/20/2020 CLINICAL DATA:  34 year old male with new dizziness. Abnormal appearance of the cerebral white matter on head CT earlier today. EXAM: MRI HEAD WITHOUT AND WITH CONTRAST TECHNIQUE: Multiplanar, multiecho pulse sequences of the brain and surrounding structures were obtained without and with intravenous contrast. CONTRAST:  62mL GADAVIST GADOBUTROL 1 MMOL/ML IV SOLN COMPARISON:  Head CT 1439 hours. FINDINGS: Brain: Patchy and confluent abnormal T2 and FLAIR hyperintensity in the bilateral periventricular white matter. Temporal lobe involvement greater on the left. Corpus callosum volume loss is associated. White Matter lesions in the right corona radiata and centrum semiovale demonstrate patchy diffusion restriction (series 5, image 83) and indistinct petechial and leading edge type enhancement (series 19 images 32 through 34). No other diffusion restricted  or enhancing lesions, but there is also advanced involvement of the right cerebellar peduncle and adjacent brainstem (series 11, image 6 and series 9, image 6). No cortical involvement identified. The corona radiata involvement does track toward the posterior limbs of the internal capsules on both sides. The deep gray nuclei appear spared. No superimposed restricted diffusion suggestive of acute infarction. No midline shift, mass effect, evidence of mass lesion, ventriculomegaly, extra-axial collection or acute intracranial hemorrhage. No chronic cerebral blood products. Cervicomedullary junction and pituitary are within normal limits. No other abnormal enhancement. No dural thickening  identified. Vascular: Major intracranial vascular flow voids are preserved. The major dural venous sinuses are enhancing and appear to be patent. Skull and upper cervical spine: Grossly normal visible cervical spine, spinal cord. Visualized bone marrow signal is within normal limits. Sinuses/Orbits: Mildly Disconjugate gaze but otherwise negative orbits. Paranasal sinuses and mastoids are stable and well pneumatized. Other: Visible internal auditory structures appear normal. Scalp and face appear negative. 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 Active demyelination in the right hemisphere white matter. Conspicuous chronic involvement of the right brainstem and cerebellar peduncle. Electronically Signed   By: Odessa Fleming M.D.   On: 07/20/2020 21:18    EKG: Independently reviewed.  Vent. rate 90 BPM PR interval 134 ms QRS duration 82 ms QT/QTc 362/442 ms P-R-T axes 81 56 42  Assessment/Plan Principal Problem:   Unsteady gait   Magnetic resonance imaging of brain abnormal High suspicion for multiple sclerosis. Neurology has started the patient on high-dose methylprednisolone. Consult physical therapy. MRI scanning of cervical and thoracic spine. Consider CSF analysis and oligoclonal bands if imaging insufficient. Disease modifying therapy per neurology.  Active Problems:   Thrombocytosis Monitor platelet count.    Elevated serum creatinine Normal GFR. Monitor creatinine level.    Hyperglycemia Has not eaten much recently. Has class II obesity. Positive family history of diabetes. Monitor glucose level while on high-dose Solu-Medrol.    Hypertension Has not taken treatment in a while. I will start amlodipine 5 mg p.o. daily. Monitor blood pressure.     DVT prophylaxis: Lovenox SQ. Code Status:   Full code. Family Communication: Disposition Plan:   Patient is from:  Home.  Anticipated DC  to:  Home.  Anticipated DC date:  07/22/2020.  Anticipated DC barriers: 07/22/2020. Consults called:  Dr. Dionisio Paschal (neurology) Admission status:  Observation/telemetry. Severity of Illness: High due to worsening chronic ataxia concerning for multiple sclerosis or some other demyelinating disease.  Bobette Mo MD [facility]  How to contact the [facility] Attending or Consulting provider 7A - 7P or covering provider during after hours 7P -7A, for this patient?   1. Check the care team in [facility] and look for a) attending/consulting [facility] provider listed and b) the [facility] team listed 2. Log into [email or web address] and use [place]'s universal password to access. If you do not have the password, please contact the hospital operator. 3. Locate the [facility] provider you are looking for under [facility] and page to a number that you can be directly reached. 4. If you still have difficulty reaching the provider, please page the [facility] (Director on Call) for the Hospitalists listed on amion for assistance.  07/20/2020, 10:37 PM   This document was prepared using Dragon voice recognition software may contain some unintended transcription errors.

## 2020-07-20 NOTE — Consult Note (Signed)
Neurology Consultation Reason for Consult: difficulty walking Referring Physician: Rosalia Hammers, D  CC: Difficulty walking  History is obtained from: Patient, family  HPI: Cody Rodriguez is a 34 y.o. male with a history of a previous episode of lower extremity weakness who was evaluated by Delena Serve who recommended an MRI at that time, but the patient was unable to have it done at that time.  Over the past week he has had progressive difficulty walking as well as some double vision and for that reason presented to the emergency department [facility] where a CT was performed concerning for MS.  He was then transferred to [facility] to obtain an MRI which demonstrates multifocal T2 change as well as a single enhancing lesion in the right parietal region concerning for multiple sclerosis.     ROS: A 14 point ROS was performed and is negative except as noted in the HPI.  Past Medical History:  Diagnosis Date  . Gynecomastia, male 07/02/2014  . High blood pressure   . Low back pain   . Paresthesia and pain of both upper extremities    also in right leg  . Rhabdomyolysis 11/24/2019  . Swelling      Family History  Problem Relation Age of Onset  . Healthy Mother   . Diabetes Mother   . Hypertension Mother   . Hyperlipidemia Mother   . Sleep apnea Mother   . Hypertension Father   . Hyperlipidemia Father   . Healthy Sister   . Healthy Brother   . Diabetes Maternal Aunt   . Neuropathy Neg Hx   . Multiple sclerosis Neg Hx      Social History:  reports that he has quit smoking. His smoking use included cigarettes. He has a 5.00 pack-year smoking history. He has never used smokeless tobacco. He reports current alcohol use. He reports that he does not use drugs.   Exam: Current vital signs: BP (!) 144/80   Pulse 73   Temp 98.1 F (36.7 C) (Oral)   Resp 17   Ht 5\' 10"  (1.778 m)   Wt 116.1 kg   SpO2 98%   BMI 36.73 kg/m  Vital signs in last 24 hours: Temp:   [98.1 F (36.7 C)-98.6 F (37 C)] 98.1 F (36.7 C) (10/15 2123) Pulse Rate:  [73-94] 73 (10/15 2230) Resp:  [17-20] 17 (10/15 2230) BP: (101-164)/(43-96) 144/80 (10/15 2230) SpO2:  [98 %-100 %] 98 % (10/15 2230) Weight:  [116.1 kg] 116.1 kg (10/15 1223)   Physical Exam  Constitutional: Appears well-developed and well-nourished.  Psych: Affect appropriate to situation Eyes: No scleral injection HENT: No OP obstrucion MSK: no joint deformities.  Cardiovascular: Normal rate and regular rhythm.  Respiratory: Effort normal, non-labored breathing GI: Soft.  No distension. There is no tenderness.  Skin: WDI  Neuro: Mental Status: Patient is awake, alert, oriented to person, place, month, year, and situation. Patient is able to give a clear and coherent history. No signs of aphasia or neglect Cranial Nerves: II: Visual Fields are full.  Right pupil is 5 mm and reactive, left pupil is 3 mm and reactive III,IV, VI: He has slight right exotropia and left hypermetria V: Facial sensation is symmetric to temperature VII: Facial movement is symmetric.  Motor: Tone is normal. Bulk is normal.  He has 5/5 strength in the right arm and leg, 4/5 strength in the left arm and leg. Sensory: Sensation is symmetric to light touch and temperature in the arms  and legs. Deep Tendon Reflexes: 2+ and symmetric in the right biceps, 3+ in the left biceps, 3+ bilateral patella, no clonus at either ankle. Plantars: Toes are downgoing bilaterally.  Cerebellar: He has significant ataxia of both the right arm and leg.  I have reviewed labs in epic and the results pertinent to this consultation are: Right M1.25  I have reviewed the images obtained: MRI brain-multifocal T2 change in multiple brain areas with a single enhancing lesion.  Impression: 34 year old male with separation in time and space by both MRI and clinical criteria consistent with multiple sclerosis.  I suspect that he likely has spinal  lesions as well based on his exam, and will get spinal imaging as well.  Given his difficulty walking, I think he would likely need physical therapy and would favor IV steroids at this time.  Recommendations: 1) IV Solu-Medrol 1 g daily for 3 to 5 days 2) MRI C and T-spine 3) PT, OT  Ritta Slot, MD [facility] [phone]  If 7pm- 7am, please page neurology on call as listed in [facility]ION.

## 2020-07-20 NOTE — ED Triage Notes (Addendum)
Dizziness x 3 days. Nausea. Head stuffiness. His ears feel stopped up.

## 2020-07-20 NOTE — ED Provider Notes (Addendum)
Pt sent here for MRi.  Pt is having lower leg weakness and had an abnormal ct scan.  Pt has seen neurology in the past who has considered MRI but has pt has never had done.  Pt has chronic low back pain  Review of Systems  Constitutional: Negative for chills and fever.  Neurological: Positive for weakness.  All other systems reviewed and are negative.  PE:  WDWN 34yo male Perrla, rightward gaze Lungs clear, heart RRR Results for orders placed or performed during the hospital encounter of 07/20/20  Basic metabolic panel  Result Value Ref Range   Sodium 138 135 - 145 mmol/L   Potassium 3.9 3.5 - 5.1 mmol/L   Chloride 100 98 - 111 mmol/L   CO2 27 22 - 32 mmol/L   Glucose, Bld 101 (H) 70 - 99 mg/dL   BUN 14 6 - 20 mg/dL   Creatinine, Ser 8.31 (H) 0.61 - 1.24 mg/dL   Calcium 9.8 8.9 - 51.7 mg/dL   GFR, Estimated >61 >60 mL/min   Anion gap 11 5 - 15  CBC with Differential  Result Value Ref Range   WBC 8.4 4.0 - 10.5 K/uL   RBC 5.41 4.22 - 5.81 MIL/uL   Hemoglobin 15.5 13.0 - 17.0 g/dL   HCT 73.7 39 - 52 %   MCV 85.8 80.0 - 100.0 fL   MCH 28.7 26.0 - 34.0 pg   MCHC 33.4 30.0 - 36.0 g/dL   RDW 10.6 26.9 - 48.5 %   Platelets 439 (H) 150 - 400 K/uL   nRBC 0.0 0.0 - 0.2 %   Neutrophils Relative % 67 %   Neutro Abs 5.7 1.7 - 7.7 K/uL   Lymphocytes Relative 22 %   Lymphs Abs 1.8 0.7 - 4.0 K/uL   Monocytes Relative 9 %   Monocytes Absolute 0.8 0.1 - 1.0 K/uL   Eosinophils Relative 0 %   Eosinophils Absolute 0.0 0.0 - 0.5 K/uL   Basophils Relative 1 %   Basophils Absolute 0.0 0.0 - 0.1 K/uL   Immature Granulocytes 1 %   Abs Immature Granulocytes 0.04 0.00 - 0.07 K/uL   CT Head Wo Contrast  Result Date: 07/20/2020 CLINICAL DATA:  Dizziness EXAM: CT HEAD WITHOUT CONTRAST TECHNIQUE: Contiguous axial images were obtained from the base of the skull through the vertex without intravenous contrast. COMPARISON:  05/12/2017 head CT. FINDINGS: Brain: No intracranial hemorrhage. No mass  lesion. No midline shift, ventriculomegaly or extra-axial fluid collection. Cerebral volume is within normal limits. Bilateral periatrial white matter hypodensities. Vascular: No hyperdense vessel or unexpected calcification. Skull: Negative for fracture or focal lesion. Sinuses/Orbits: Normal orbits. Clear paranasal sinuses. No mastoid effusion. Other: None. IMPRESSION: Nonspecific bilateral periatrial white matter hypodensities. Differential includes demyelination, age indeterminate insults, post infectious/inflammatory sequela and vasculopathy. MRI brain with and without contrast is recommended. Electronically Signed   By: Stana Bunting M.D.   On: 07/20/2020 14:47   MR Brain W and Wo Contrast  Result Date: 07/20/2020 CLINICAL DATA:  34 year old male with new dizziness. Abnormal appearance of the cerebral white matter on head CT earlier today. EXAM: MRI HEAD WITHOUT AND WITH CONTRAST TECHNIQUE: Multiplanar, multiecho pulse sequences of the brain and surrounding structures were obtained without and with intravenous contrast. CONTRAST:  81mL GADAVIST GADOBUTROL 1 MMOL/ML IV SOLN COMPARISON:  Head CT 1439 hours. FINDINGS: Brain: Patchy and confluent abnormal T2 and FLAIR hyperintensity in the bilateral periventricular white matter. Temporal lobe involvement greater on the left. Corpus callosum volume  loss is associated. White Matter lesions in the right corona radiata and centrum semiovale demonstrate patchy diffusion restriction (series 5, image 83) and indistinct petechial and leading edge type enhancement (series 19 images 32 through 34). No other diffusion restricted or enhancing lesions, but there is also advanced involvement of the right cerebellar peduncle and adjacent brainstem (series 11, image 6 and series 9, image 6). No cortical involvement identified. The corona radiata involvement does track toward the posterior limbs of the internal capsules on both sides. The deep gray nuclei appear spared.  No superimposed restricted diffusion suggestive of acute infarction. No midline shift, mass effect, evidence of mass lesion, ventriculomegaly, extra-axial collection or acute intracranial hemorrhage. No chronic cerebral blood products. Cervicomedullary junction and pituitary are within normal limits. No other abnormal enhancement. No dural thickening identified. Vascular: Major intracranial vascular flow voids are preserved. The major dural venous sinuses are enhancing and appear to be patent. Skull and upper cervical spine: Grossly normal visible cervical spine, spinal cord. Visualized bone marrow signal is within normal limits. Sinuses/Orbits: Mildly Disconjugate gaze but otherwise negative orbits. Paranasal sinuses and mastoids are stable and well pneumatized. Other: Visible internal auditory structures appear normal. Scalp and face appear negative. 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 Active demyelination in the right hemisphere white matter. Conspicuous chronic involvement of the right brainstem and cerebellar peduncle. Electronically Signed   By: Odessa Fleming M.D.   On: 07/20/2020 21:18   MDM;  Pt has symptoms of acute MS with probable chronic ms symptoms in the past  I spoke to Dr. Amada Jupiter who advised solumedrol 1 gram.  He advised admission to Hospitalist.  I spoke to Dr. Robb Matar who will admit   Osie Cheeks 07/20/20 2217    Elson Areas, PA-C 07/20/20 2244    Margarita Grizzle, MD 07/21/20 0004

## 2020-07-21 ENCOUNTER — Encounter (HOSPITAL_COMMUNITY): Payer: Self-pay | Admitting: Internal Medicine

## 2020-07-21 ENCOUNTER — Observation Stay (HOSPITAL_COMMUNITY): Payer: 59

## 2020-07-21 DIAGNOSIS — Z6836 Body mass index (BMI) 36.0-36.9, adult: Secondary | ICD-10-CM | POA: Diagnosis not present

## 2020-07-21 DIAGNOSIS — D75839 Thrombocytosis, unspecified: Secondary | ICD-10-CM | POA: Diagnosis present

## 2020-07-21 DIAGNOSIS — R2681 Unsteadiness on feet: Secondary | ICD-10-CM | POA: Diagnosis not present

## 2020-07-21 DIAGNOSIS — N179 Acute kidney failure, unspecified: Secondary | ICD-10-CM | POA: Diagnosis present

## 2020-07-21 DIAGNOSIS — Z9114 Patient's other noncompliance with medication regimen: Secondary | ICD-10-CM | POA: Diagnosis not present

## 2020-07-21 DIAGNOSIS — Z20822 Contact with and (suspected) exposure to covid-19: Secondary | ICD-10-CM | POA: Diagnosis present

## 2020-07-21 DIAGNOSIS — G35 Multiple sclerosis: Secondary | ICD-10-CM

## 2020-07-21 DIAGNOSIS — I10 Essential (primary) hypertension: Secondary | ICD-10-CM | POA: Diagnosis present

## 2020-07-21 DIAGNOSIS — Z8249 Family history of ischemic heart disease and other diseases of the circulatory system: Secondary | ICD-10-CM | POA: Diagnosis not present

## 2020-07-21 DIAGNOSIS — M545 Low back pain, unspecified: Secondary | ICD-10-CM | POA: Diagnosis present

## 2020-07-21 DIAGNOSIS — R7303 Prediabetes: Secondary | ICD-10-CM | POA: Diagnosis present

## 2020-07-21 DIAGNOSIS — Z79899 Other long term (current) drug therapy: Secondary | ICD-10-CM | POA: Diagnosis not present

## 2020-07-21 DIAGNOSIS — H5500 Unspecified nystagmus: Secondary | ICD-10-CM | POA: Diagnosis present

## 2020-07-21 DIAGNOSIS — Z87891 Personal history of nicotine dependence: Secondary | ICD-10-CM | POA: Diagnosis not present

## 2020-07-21 DIAGNOSIS — Z91128 Patient's intentional underdosing of medication regimen for other reason: Secondary | ICD-10-CM | POA: Diagnosis not present

## 2020-07-21 DIAGNOSIS — E669 Obesity, unspecified: Secondary | ICD-10-CM | POA: Diagnosis present

## 2020-07-21 DIAGNOSIS — T50906A Underdosing of unspecified drugs, medicaments and biological substances, initial encounter: Secondary | ICD-10-CM | POA: Diagnosis present

## 2020-07-21 DIAGNOSIS — Z833 Family history of diabetes mellitus: Secondary | ICD-10-CM | POA: Diagnosis not present

## 2020-07-21 DIAGNOSIS — G8929 Other chronic pain: Secondary | ICD-10-CM | POA: Diagnosis present

## 2020-07-21 DIAGNOSIS — R7989 Other specified abnormal findings of blood chemistry: Secondary | ICD-10-CM | POA: Diagnosis present

## 2020-07-21 DIAGNOSIS — G35A Relapsing-remitting multiple sclerosis: Secondary | ICD-10-CM | POA: Diagnosis present

## 2020-07-21 DIAGNOSIS — R42 Dizziness and giddiness: Secondary | ICD-10-CM | POA: Diagnosis present

## 2020-07-21 HISTORY — DX: Multiple sclerosis: G35

## 2020-07-21 LAB — COMPREHENSIVE METABOLIC PANEL
ALT: 46 U/L — ABNORMAL HIGH (ref 0–44)
AST: 58 U/L — ABNORMAL HIGH (ref 15–41)
Albumin: 4 g/dL (ref 3.5–5.0)
Alkaline Phosphatase: 65 U/L (ref 38–126)
Anion gap: 11 (ref 5–15)
BUN: 11 mg/dL (ref 6–20)
CO2: 23 mmol/L (ref 22–32)
Calcium: 9.7 mg/dL (ref 8.9–10.3)
Chloride: 103 mmol/L (ref 98–111)
Creatinine, Ser: 1.24 mg/dL (ref 0.61–1.24)
GFR, Estimated: >60 mL/min (ref >60)
Glucose, Bld: 134 mg/dL — ABNORMAL HIGH (ref 70–99)
Potassium: 4.4 mmol/L (ref 3.5–5.1)
Sodium: 137 mmol/L (ref 135–145)
Total Bilirubin: 0.9 mg/dL (ref 0.3–1.2)
Total Protein: 8 g/dL (ref 6.5–8.1)

## 2020-07-21 LAB — PHOSPHORUS: Phosphorus: 2.3 mg/dL — ABNORMAL LOW (ref 2.5–4.6)

## 2020-07-21 LAB — MAGNESIUM: Magnesium: 2 mg/dL (ref 1.7–2.4)

## 2020-07-21 LAB — GLUCOSE, CAPILLARY
Glucose-Capillary: 179 mg/dL — ABNORMAL HIGH (ref 70–99)
Glucose-Capillary: 127 mg/dL — ABNORMAL HIGH (ref 70–99)

## 2020-07-21 IMAGING — MR MR THORACIC SPINE WO/W CM
5 of 11 series · 17 of 48 positions shown · IV contrast (gadavist)
Comparison: [DATE] MRI head.

CLINICAL DATA: Myelopathy, acute or progressive.

EXAM:
MRI CERVICAL AND THORACIC SPINE WITHOUT AND WITH CONTRAST
TECHNIQUE: Multiplanar and multiecho pulse sequences of the cervical spine, to
include the craniocervical junction and cervicothoracic junction,
and the thoracic spine, were obtained without and with intravenous
contrast.
CONTRAST:  10mL GADAVIST GADOBUTROL 1 MMOL/ML IV SOLN

[Series 16: T1 · sagittal · 3.0mm · 0.62mm/px · 3 of 9 slices shown (1 of 3)]
[im 1/9]
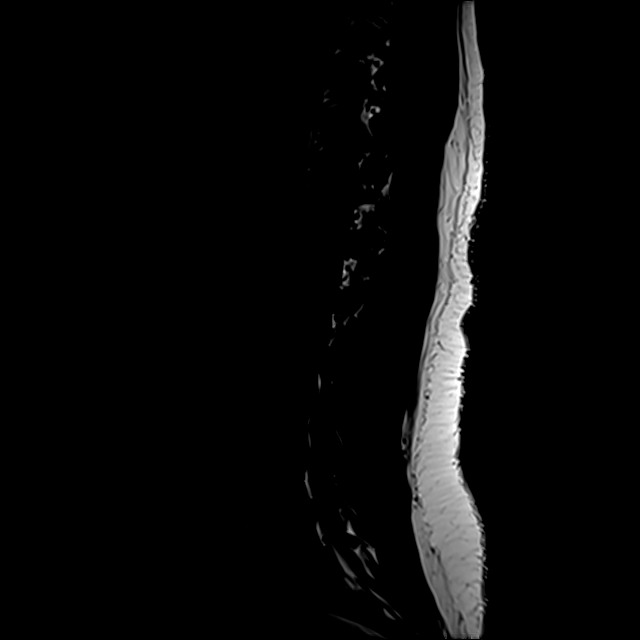
[im 5/9]
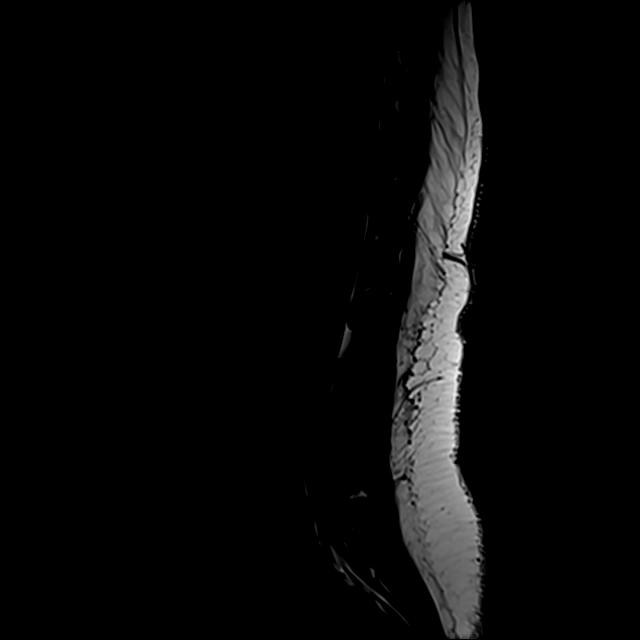
[im 9/9]
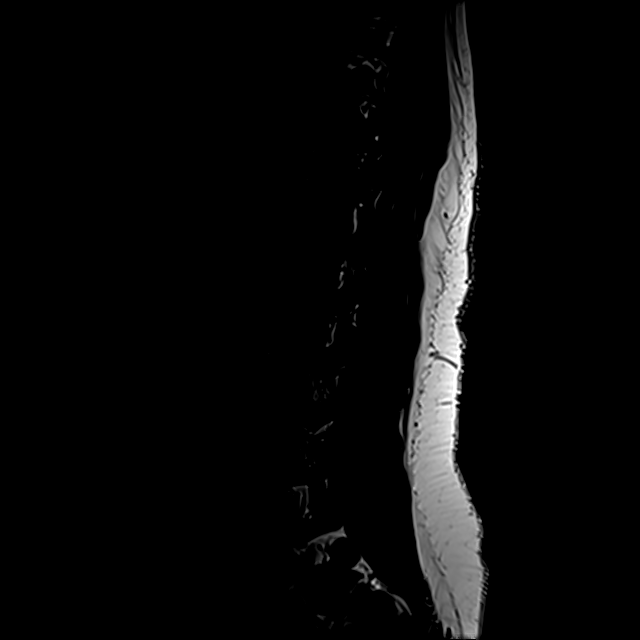

[Series 17: T1 · sagittal · 3.0mm · 0.62mm/px · 3 of 9 slices shown (2 of 3)]
[im 1/9]
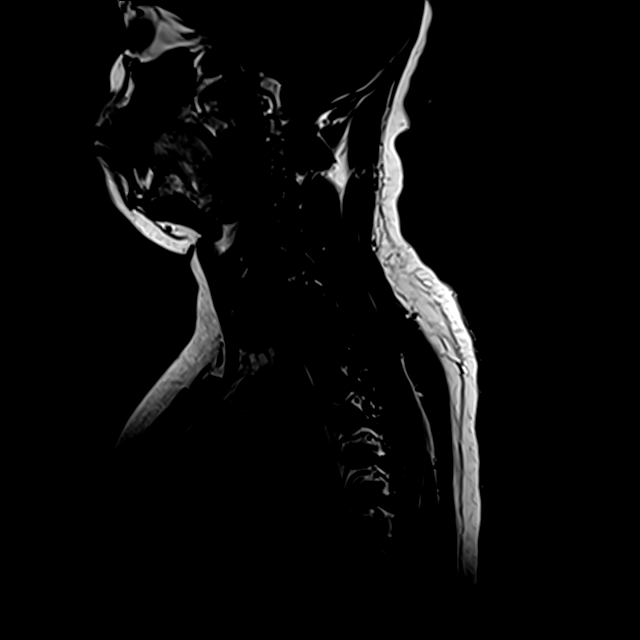
[im 5/9]
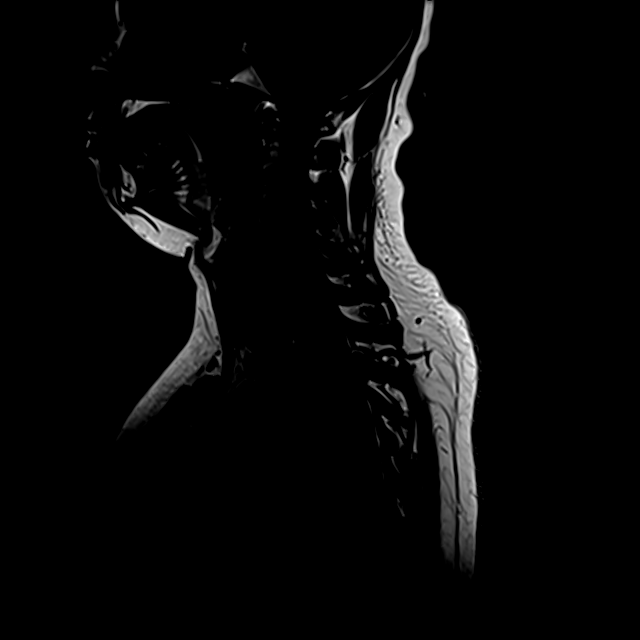
[im 9/9]
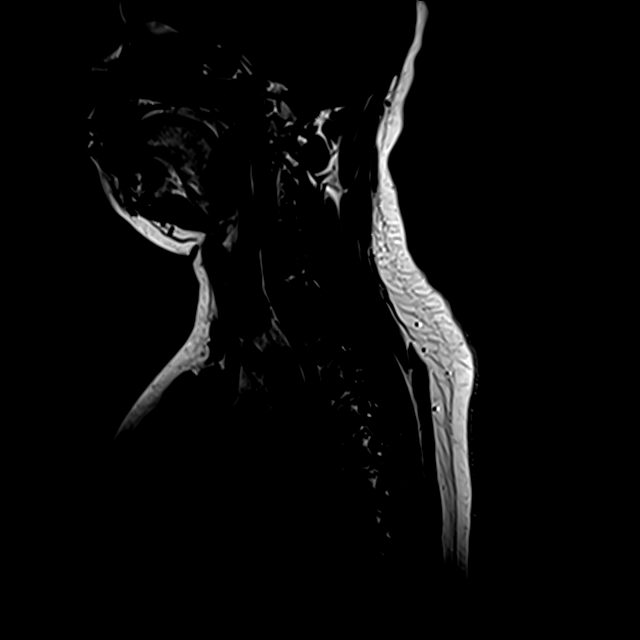

[Series 18: T1 · sagittal · 3.3mm · 0.62mm/px · 1 of 9 slices shown (3 of 3)]
[im 1/9]
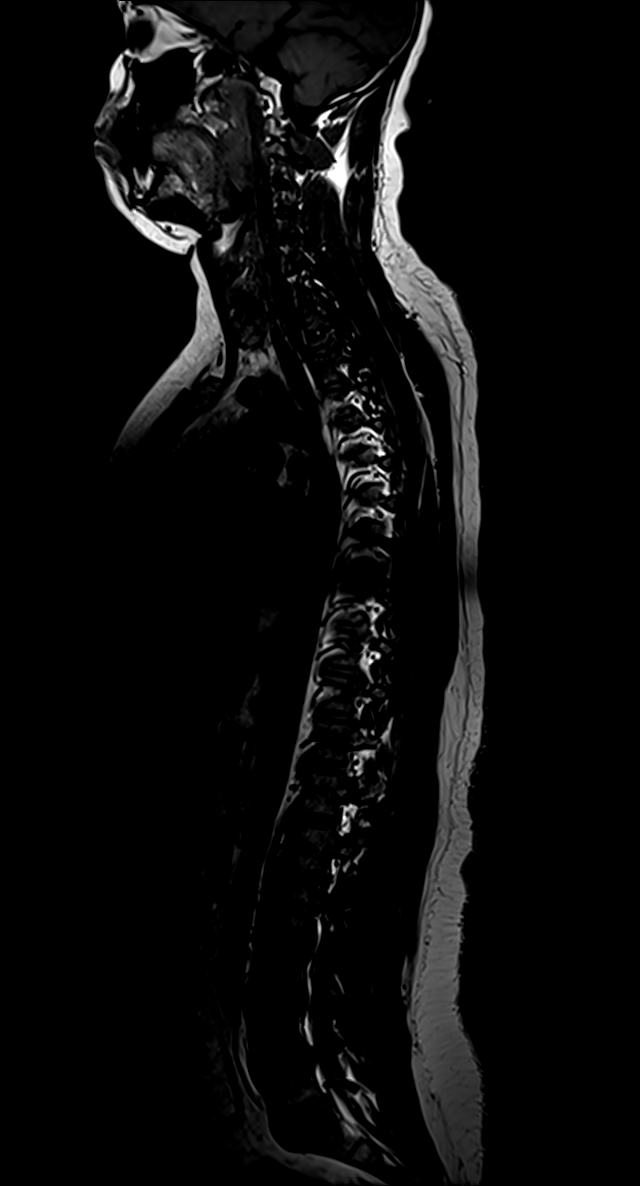

[Series 19: T2 · sagittal · 3.0mm · 0.76mm/px · 3 of 17 slices shown (1 of 2)]
[im 1/17]
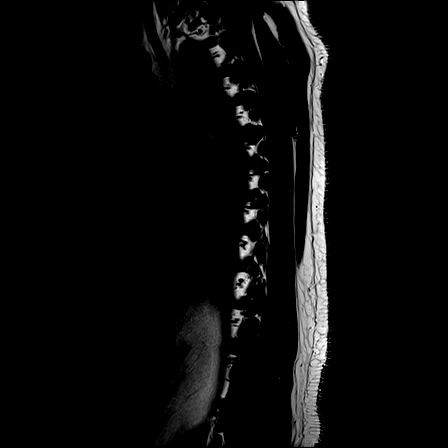
[im 9/17]
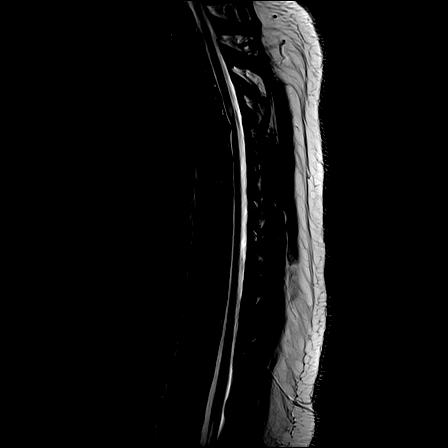
[im 17/17]
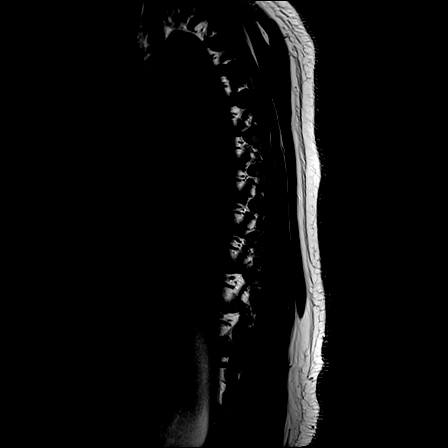

[Series 22: T2 · axial · 5.0mm · 0.59mm/px · z∈[-402,-127]mm · 7 of 39 slices shown (2 of 2)]
[im 1/39]
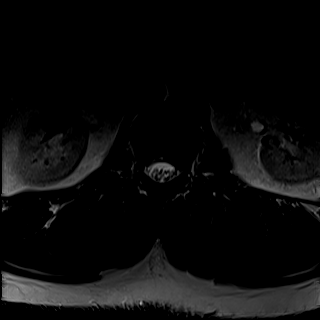
[im 7/39]
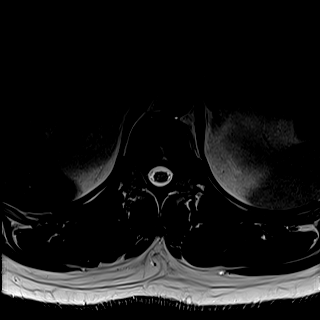
[im 13/39]
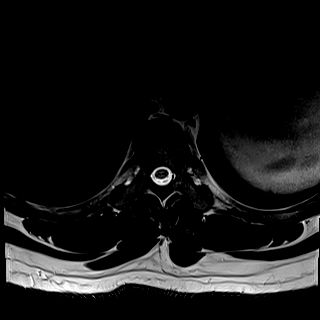
[im 20/39]
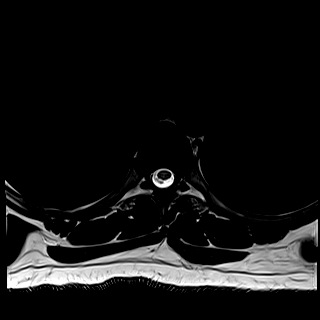
[im 26/39]
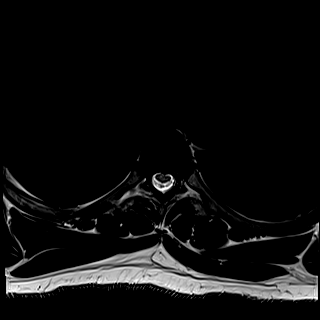
[im 32/39]
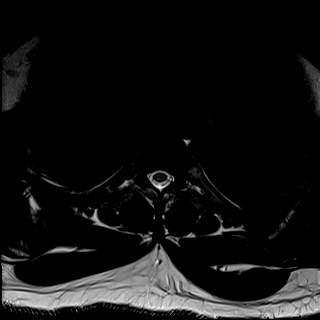
[im 39/39]
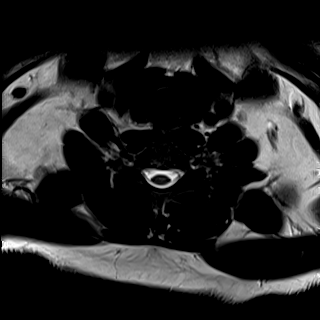

[17 of 48 positions shown; findings below may reference images not displayed]

FINDINGS: MRI CERVICAL SPINE FINDINGS

Alignment: Normal.

Vertebrae: Normal bone marrow signal intensity. No focal osseous
lesion. No abnormal enhancement.

Cord: Normal cord morphology. Multifocal T2 hyperintense cord
lesions at the C2-3, C4, C5 and C6-7 level. Small focal enhancement
within the anterior cord at the C3 level. No restricted diffusion.

Posterior Fossa, vertebral arteries: Please see recent MRI head.
Normal flow voids.

Disc levels: Disc spaces are preserved.

C2-3: Negative

C3-4: Negative

C4-5: Negative

C5-6: Small disc osteophyte complex with superimposed right
foraminal and central protrusions. Patent spinal canal and left
neural foramen. Mild right neural foraminal narrowing.

C6-7: Negative

C7-T1: Left predominant uncovertebral and facet degenerative
spurring with mild left neural foraminal narrowing. Patent spinal
canal and right neural foramen.

Paraspinal tissues: Negative.

MRI THORACIC SPINE FINDINGS

Alignment:  Normal.

Vertebrae: Normal bone marrow signal intensity. No focal osseous
lesions.

Cord: Normal cord morphology. T2 hyperintense central cord lesion at
the T4 level. Smaller posterior midline T2 hyperintensity at the
T9-10 level ([DATE]). No cord enhancement.

Paraspinal and other soft tissues: Negative.

Disc levels:

No significant spinal canal or neural foraminal narrowing.
IMPRESSION: Multifocal cervical cord lesions. Focal anterior cord enhancement at
the C3 level may reflect active demyelination.

Thoracic cord lesions at the T4 and T9-10 level. No abnormal
enhancement to suggest active demyelination.

## 2020-07-21 IMAGING — MR MR CERVICAL SPINE WO/W CM
7 of 11 series · 27 of 48 positions shown · IV contrast (Gadavist)
Comparison: [DATE] MRI head.

CLINICAL DATA: Myelopathy, acute or progressive.

EXAM:
MRI CERVICAL AND THORACIC SPINE WITHOUT AND WITH CONTRAST
TECHNIQUE: Multiplanar and multiecho pulse sequences of the cervical spine, to
include the craniocervical junction and cervicothoracic junction,
and the thoracic spine, were obtained without and with intravenous
contrast.
CONTRAST:  10mL GADAVIST GADOBUTROL 1 MMOL/ML IV SOLN

[Series 1: T2 · sagittal · 3.0mm · 0.66mm/px · 2 of 15 slices shown (1 of 2)]
[im 1/15]
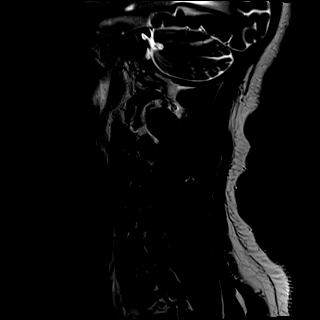
[im 15/15]
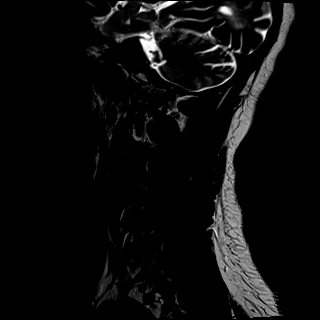

[Series 2: T1 · sagittal · 3.0mm · 0.66mm/px · 2 of 15 slices shown (1 of 2)]
[im 1/15]
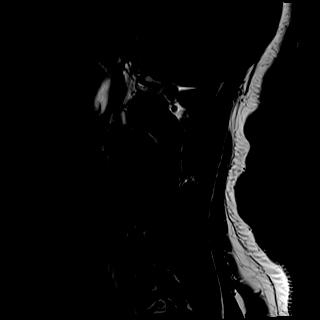
[im 15/15]
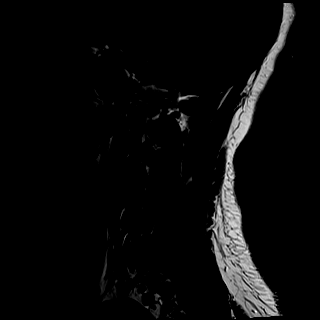

[Series 3: STIR · sagittal · 3.0mm · 0.82mm/px · 3 of 15 slices shown]
[im 1/15]
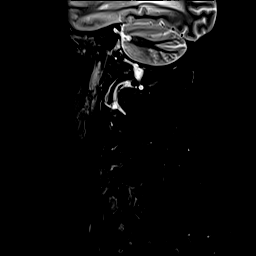
[im 8/15]
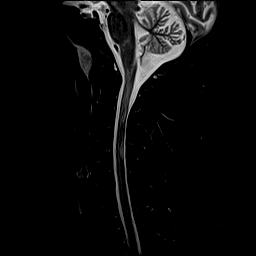
[im 15/15]
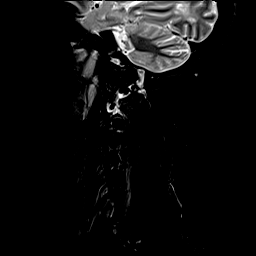

[Series 4: T2 · axial · 3.0mm · 0.66mm/px · z∈[-120,+6]mm · 7 of 40 slices shown (2 of 2)]
[im 1/40]
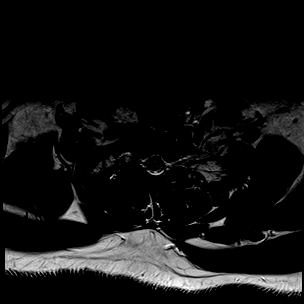
[im 7/40]
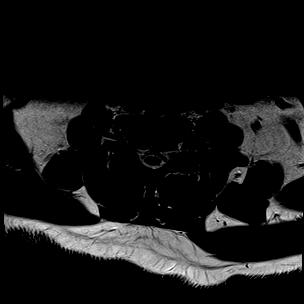
[im 14/40]
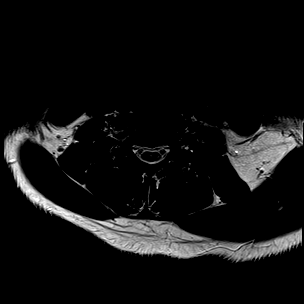
[im 20/40]
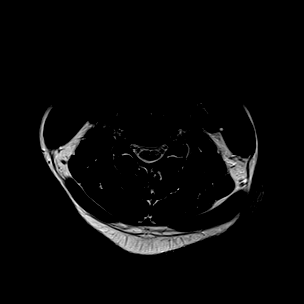
[im 27/40]
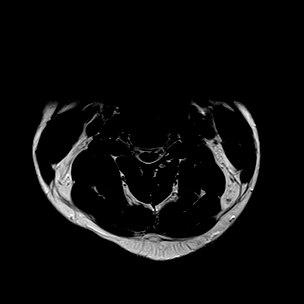
[im 33/40]
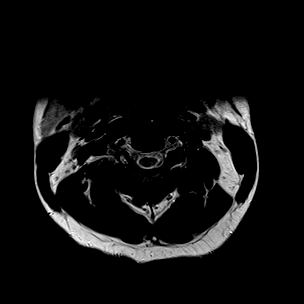
[im 40/40]
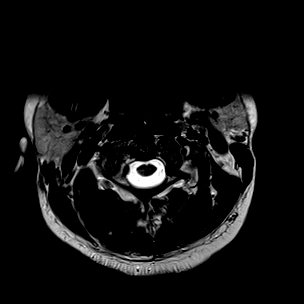

[Series 6: T1 · axial · 3.0mm · 0.35mm/px · z∈[-119,+7]mm · 7 of 40 slices shown (2 of 2)]
[im 1/40]
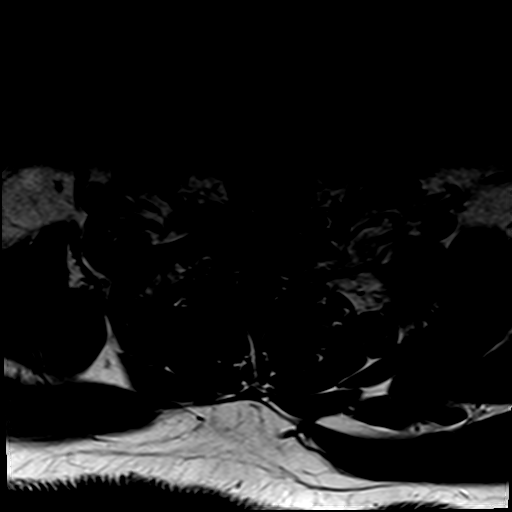
[im 7/40]
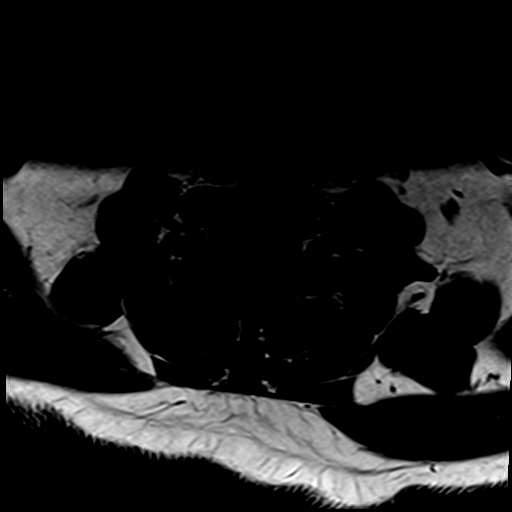
[im 14/40]
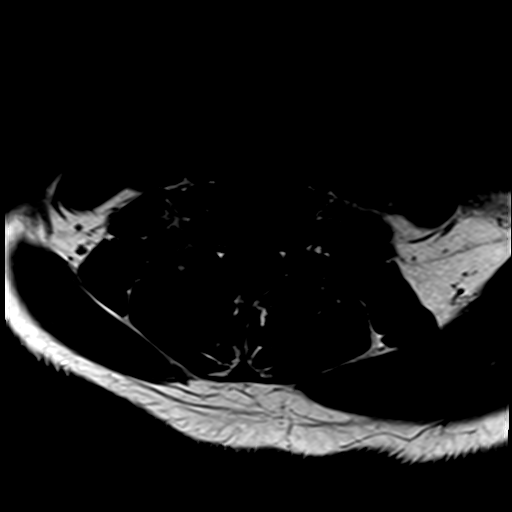
[im 20/40]
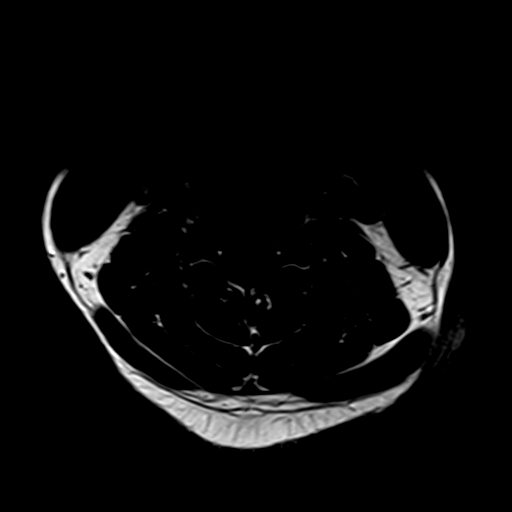
[im 27/40]
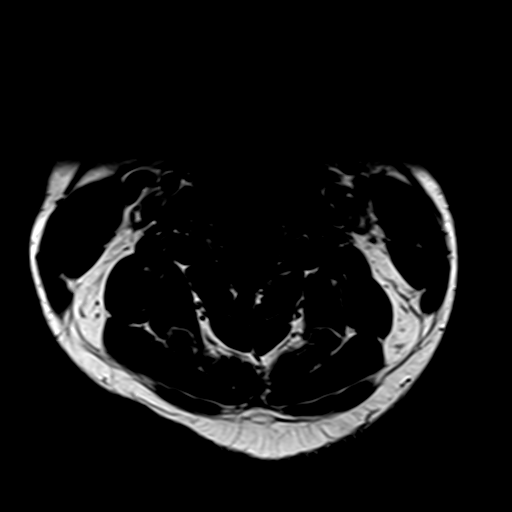
[im 33/40]
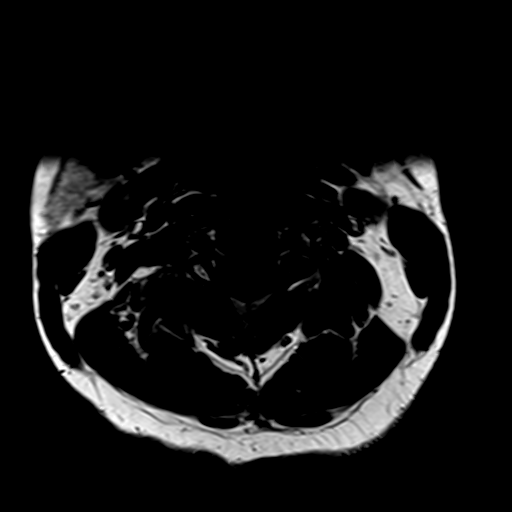
[im 40/40]
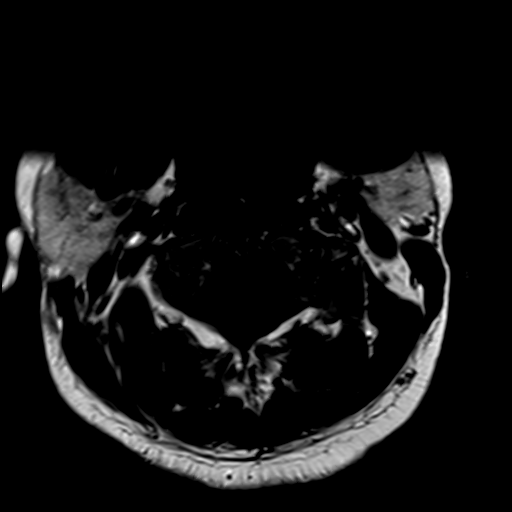

[Series 9: T1 post-contrast · sagittal · 3.0mm · 0.41mm/px · 3 of 15 slices shown]
[im 1/15]
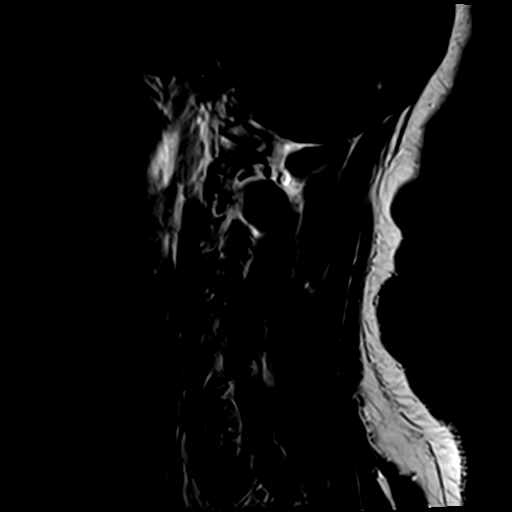
[im 8/15]
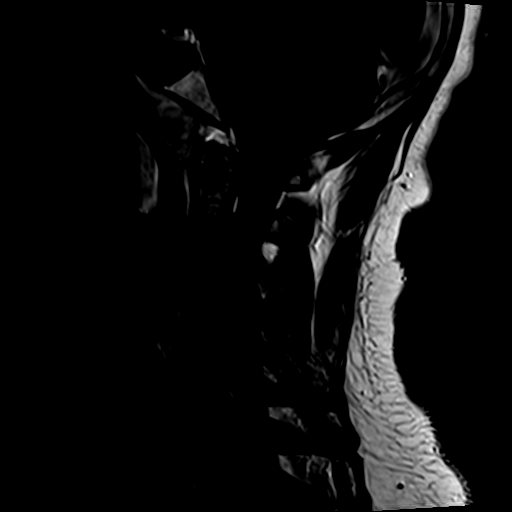
[im 15/15]
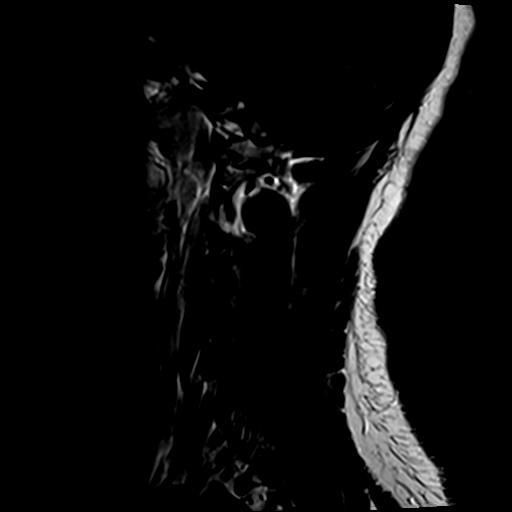

[Series 13: T1 fat-sat post-contrast · sagittal · 3.0mm · 0.41mm/px · 3 of 15 slices shown]
[im 1/15]
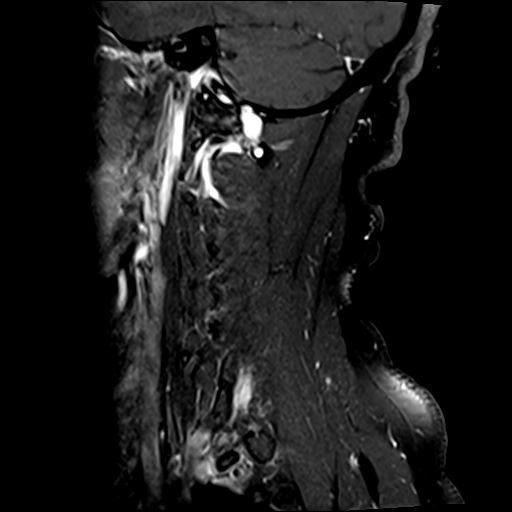
[im 8/15]
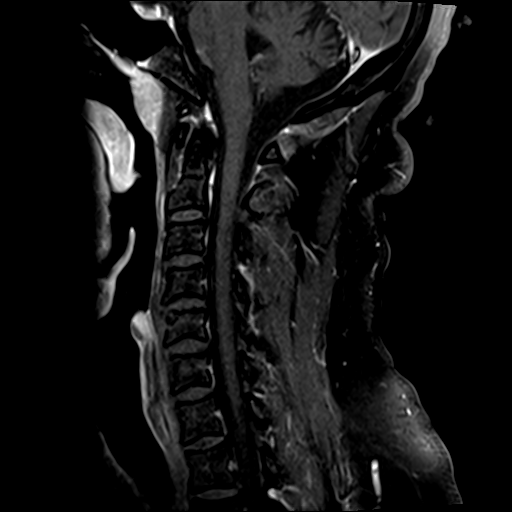
[im 15/15]
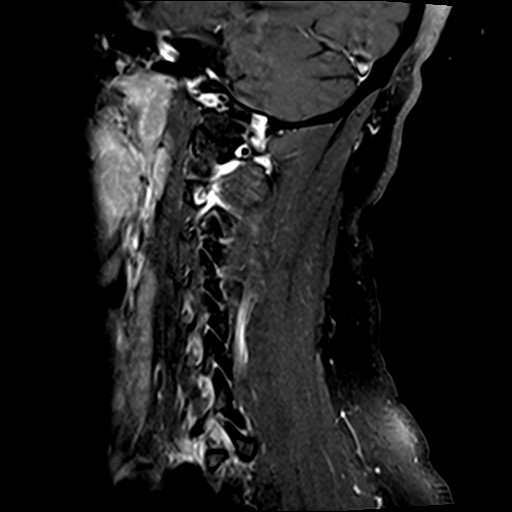

[27 of 48 positions shown; findings below may reference images not displayed]

FINDINGS: MRI CERVICAL SPINE FINDINGS

Alignment: Normal.

Vertebrae: Normal bone marrow signal intensity. No focal osseous
lesion. No abnormal enhancement.

Cord: Normal cord morphology. Multifocal T2 hyperintense cord
lesions at the C2-3, C4, C5 and C6-7 level. Small focal enhancement
within the anterior cord at the C3 level. No restricted diffusion.

Posterior Fossa, vertebral arteries: Please see recent MRI head.
Normal flow voids.

Disc levels: Disc spaces are preserved.

C2-3: Negative

C3-4: Negative

C4-5: Negative

C5-6: Small disc osteophyte complex with superimposed right
foraminal and central protrusions. Patent spinal canal and left
neural foramen. Mild right neural foraminal narrowing.

C6-7: Negative

C7-T1: Left predominant uncovertebral and facet degenerative
spurring with mild left neural foraminal narrowing. Patent spinal
canal and right neural foramen.

Paraspinal tissues: Negative.

MRI THORACIC SPINE FINDINGS

Alignment:  Normal.

Vertebrae: Normal bone marrow signal intensity. No focal osseous
lesions.

Cord: Normal cord morphology. T2 hyperintense central cord lesion at
the T4 level. Smaller posterior midline T2 hyperintensity at the
T9-10 level ([DATE]). No cord enhancement.

Paraspinal and other soft tissues: Negative.

Disc levels:

No significant spinal canal or neural foraminal narrowing.
IMPRESSION: Multifocal cervical cord lesions. Focal anterior cord enhancement at
the C3 level may reflect active demyelination.

Thoracic cord lesions at the T4 and T9-10 level. No abnormal
enhancement to suggest active demyelination.

## 2020-07-21 MED ORDER — SODIUM CHLORIDE 0.9 % IV SOLN
1000.0000 mg | INTRAVENOUS | Status: AC
  Administered 2020-07-21 – 2020-07-24 (×4): 1000 mg via INTRAVENOUS
  Filled 2020-07-21 (×4): qty 8

## 2020-07-21 MED ORDER — PANTOPRAZOLE SODIUM 40 MG PO TBEC
40.0000 mg | DELAYED_RELEASE_TABLET | Freq: Every day | ORAL | Status: DC
  Administered 2020-07-21 – 2020-07-24 (×4): 40 mg via ORAL
  Filled 2020-07-21 (×4): qty 1

## 2020-07-21 MED ORDER — INSULIN ASPART 100 UNIT/ML ~~LOC~~ SOLN: 0.0000 [IU] | Freq: Every day | SUBCUTANEOUS | Status: DC

## 2020-07-21 MED ORDER — INSULIN ASPART 100 UNIT/ML ~~LOC~~ SOLN
0.0000 [IU] | Freq: Three times a day (TID) | SUBCUTANEOUS | Status: DC
  Administered 2020-07-21 – 2020-07-22 (×2): 1 [IU] via SUBCUTANEOUS
  Administered 2020-07-22 (×2): 2 [IU] via SUBCUTANEOUS
  Administered 2020-07-23: 3 [IU] via SUBCUTANEOUS
  Administered 2020-07-23: 2 [IU] via SUBCUTANEOUS
  Administered 2020-07-24 (×2): 1 [IU] via SUBCUTANEOUS
  Administered 2020-07-24: 2 [IU] via SUBCUTANEOUS

## 2020-07-21 MED ORDER — AMLODIPINE BESYLATE 5 MG PO TABS
5.0000 mg | ORAL_TABLET | Freq: Every day | ORAL | Status: DC
  Administered 2020-07-21 – 2020-07-24 (×4): 5 mg via ORAL
  Filled 2020-07-21 (×5): qty 1

## 2020-07-21 MED ORDER — GADOBUTROL 1 MMOL/ML IV SOLN
10.0000 mL | Freq: Once | INTRAVENOUS | Status: AC | PRN
  Administered 2020-07-21: 10 mL via INTRAVENOUS

## 2020-07-21 MED ORDER — CHOLECALCIFEROL 10 MCG (400 UNIT) PO TABS
400.0000 [IU] | ORAL_TABLET | Freq: Every day | ORAL | Status: DC
  Administered 2020-07-21 – 2020-07-24 (×4): 400 [IU] via ORAL
  Filled 2020-07-21 (×4): qty 1

## 2020-07-21 MED ORDER — POTASSIUM PHOSPHATES 15 MMOLE/5ML IV SOLN
20.0000 mmol | Freq: Once | INTRAVENOUS | Status: AC
  Administered 2020-07-21: 20 mmol via INTRAVENOUS
  Filled 2020-07-21: qty 6.67

## 2020-07-21 NOTE — Progress Notes (Signed)
PROGRESS NOTE    Cody Rodriguez  DUK:[id number] DOB: 06-04-1986 DOA: 07/20/2020 PCP: System, Provider Not In   Brief Narrative:  HPI: Cody Rodriguez is a 34 y.o. male with medical history significant of gynecomastia, hypertension, chronic lower back pain, rhabdomyolysis, history of upper extremities and RLE paresthesias who is coming to the emergency department due to above chief complaint for the past 3 days.  He has had these symptoms for more than 4 years and he was seen by Dr. Lucia Gaskins in January of 2017, but was unable to proceed with work-up due to lack of health insurance.  Since then, he has had them on and off, but the past few days have been more intense.  Patient has been having some trouble with his balance and ambulation.  He has also have a left-sided frontal headache and blurred vision since Today.  He has also been very tired.  He also complains of renal area, mild sore throat with dry cough.  No chest pain, palpitations, dyspnea, diaphoresis, PND, orthopnea or pitting edema of the lower extremities.  Denies abdominal pain, diarrhea, constipation, melena or hematochezia.  No dysuria, frequency or hematuria.   ED Course: Initial vital signs were temperature 98.6 F, pulse 94, respiration 20, BP 135/96 mmHg O2 sat 99% on room air.  The patient was given a 1000 mL NS bolus, 25 mg of meclizine by the ED providers and 1 g of methylprednisolone was ordered by Dr. Amada Jupiter.  Coronavirus PCR was negative.  CBC shows white count 8.4, moment 15.5 g/dL platelets 376.  BMP shows normal electrolytes.  His glucose 101 and creatinine 1.25 mg/dL.  GFR is more than 60 mL/min.  Brain imaging showing white matter hypodensities.  Please see radiology reports below.  Assessment & Plan:   Principal Problem:   Unsteady gait Active Problems:   Thrombocytosis   Elevated serum creatinine   Hyperglycemia   Magnetic resonance imaging of brain abnormal   Hypertension   Possible MS: Per patient, he  initially started having some paresthesia and weakness which was displaced from time and location in 2017. For this, he followed a physician and was referred for a brain MRI but he never got to it due to financial reasons. Since then, he has been having issues like that intermittently which will resolve on its own after a week or 2. Never followed up any physician after that. MRI highly suspicious for MS. Neurology consulted and has started the patient on high-dose Solu-Medrol for total of 5 doses with the first being on 07/20/2020. Appreciate neurology help and defer management to them.  AKI: Resolved.  Thrombocytosis: We will repeat labs tomorrow morning.  Hypophosphatemia: Replaced today.  Prediabetes: Hemoglobin A1c 5.8 on 07/12/2020. We will start him on SSI now that he is on high-dose Solu-Medrol and I expect hyperglycemia.  Essential hypertension: Has not taken treatment in a while. He was started on 5 mg amlodipine last night. Blood pressure still elevated. Hemoglobin A1c suggest prediabetes. I will start him on lisinopril 20 mg. Add as needed hydralazine.  DVT prophylaxis: enoxaparin (LOVENOX) injection 40 mg Start: 07/21/20 1000   Code Status: Full Code  Family Communication:  None present at bedside.  Plan of care discussed with patient in length and he verbalized understanding and agreed with it.  Status is: Observation  The patient will require care spanning > 2 midnights and should be moved to inpatient because: Inpatient level of care appropriate due to severity of illness  Dispo: The patient is  from: Home              Anticipated d/c is to: Home              Anticipated d/c date is: 1 day              Patient currently is not medically stable to d/c.        Estimated body mass index is 36.73 kg/m as calculated from the following:   Height as of this encounter: 5\' 10"  (1.778 m).   Weight as of this encounter: 116.1 kg.      Nutritional status:                Consultants:   Neurology  Procedures:   None  Antimicrobials:  Anti-infectives (From admission, onward)   None         Subjective: Seen and examined. Does not feel any difference in the weakness compared to yesterday. No new complaint.  Objective: Vitals:   07/20/20 2300 07/21/20 0112 07/21/20 0423 07/21/20 0839  BP: (!) 164/87 (!) 145/93 122/73 (!) 151/96  Pulse: 89 83 72 91  Resp: 13 18 18 18   Temp:  98.2 F (36.8 C) (!) 97.4 F (36.3 C) 98.4 F (36.9 C)  TempSrc:  Oral Oral Oral  SpO2: 100% 98% 99% 98%  Weight:      Height:        Intake/Output Summary (Last 24 hours) at 07/21/2020 1113 Last data filed at 07/20/2020 1612 Gross per 24 hour  Intake 626.84 ml  Output --  Net 626.84 ml   Filed Weights   07/20/20 1223  Weight: 116.1 kg    Examination:  General exam: Appears calm and comfortable  Respiratory system: Clear to auscultation. Respiratory effort normal. Cardiovascular system: S1 & S2 heard, RRR. No JVD, murmurs, rubs, gallops or clicks. No pedal edema. Gastrointestinal system: Abdomen is nondistended, soft and nontender. No organomegaly or masses felt. Normal bowel sounds heard. Central nervous system: Alert and oriented. No focal neurological deficits. Extremities: Symmetric 5 x 5 power. Skin: No rashes, lesions or ulcers Psychiatry: Judgement and insight appear normal. Mood & affect appropriate.    Data Reviewed: I have personally reviewed following labs and imaging studies  CBC: Recent Labs  Lab 07/20/20 1455  WBC 8.4  NEUTROABS 5.7  HGB 15.5  HCT 46.4  MCV 85.8  PLT 439*   Basic Metabolic Panel: Recent Labs  Lab 07/20/20 1455 07/21/20 0345  NA 138 137  K 3.9 4.4  CL 100 103  CO2 27 23  GLUCOSE 101* 134*  BUN 14 11  CREATININE 1.25* 1.24  CALCIUM 9.8 9.7  MG  --  2.0  PHOS  --  2.3*   GFR: Estimated Creatinine Clearance: 107.1 mL/min (by C-G formula based on SCr of 1.24 mg/dL). Liver Function Tests: Recent  Labs  Lab 07/21/20 0345  AST 58*  ALT 46*  ALKPHOS 65  BILITOT 0.9  PROT 8.0  ALBUMIN 4.0   No results for input(s): LIPASE, AMYLASE in the last 168 hours. No results for input(s): AMMONIA in the last 168 hours. Coagulation Profile: No results for input(s): INR, PROTIME in the last 168 hours. Cardiac Enzymes: No results for input(s): CKTOTAL, CKMB, CKMBINDEX, TROPONINI in the last 168 hours. BNP (last 3 results) No results for input(s): PROBNP in the last 8760 hours. HbA1C: No results for input(s): HGBA1C in the last 72 hours. CBG: No results for input(s): GLUCAP in the last 168 hours. Lipid  Profile: No results for input(s): CHOL, HDL, LDLCALC, TRIG, CHOLHDL, LDLDIRECT in the last 72 hours. Thyroid Function Tests: No results for input(s): TSH, T4TOTAL, FREET4, T3FREE, THYROIDAB in the last 72 hours. Anemia Panel: No results for input(s): VITAMINB12, FOLATE, FERRITIN, TIBC, IRON, RETICCTPCT in the last 72 hours. Sepsis Labs: No results for input(s): PROCALCITON, LATICACIDVEN in the last 168 hours.  Recent Results (from the past 240 hour(s))  Respiratory Panel by RT PCR (Flu A&B, Covid) - Nasopharyngeal Swab     Status: None   Collection Time: 07/20/20 10:49 PM   Specimen: Nasopharyngeal Swab  Result Value Ref Range Status   SARS Coronavirus 2 by RT PCR NEGATIVE NEGATIVE Final    Comment: (NOTE) SARS-CoV-2 target nucleic acids are NOT DETECTED.  The SARS-CoV-2 RNA is generally detectable in upper respiratoy specimens during the acute phase of infection. The lowest concentration of SARS-CoV-2 viral copies this assay can detect is 131 copies/mL. A negative result does not preclude SARS-Cov-2 infection and should not be used as the sole basis for treatment or other patient management decisions. A negative result may occur with  improper specimen collection/handling, submission of specimen other than nasopharyngeal swab, presence of viral mutation(s) within the areas  targeted by this assay, and inadequate number of viral copies (<131 copies/mL). A negative result must be combined with clinical observations, patient history, and epidemiological information. The expected result is Negative.  Fact Sheet for Patients:  https://www.moore.com/  Fact Sheet for Healthcare Providers:  https://www.young.biz/  This test is no t yet approved or cleared by the [place] FDA and  has been authorized for detection and/or diagnosis of SARS-CoV-2 by FDA under an Emergency Use Authorization (EUA). This EUA will remain  in effect (meaning this test can be used) for the duration of the COVID-19 declaration under Section 564(b)(1) of the Act, 21 U.S.C. section 360bbb-3(b)(1), unless the authorization is terminated or revoked sooner.     Influenza A by PCR NEGATIVE NEGATIVE Final   Influenza B by PCR NEGATIVE NEGATIVE Final    Comment: (NOTE) The Xpert Xpress SARS-CoV-2/FLU/RSV assay is intended as an aid in  the diagnosis of influenza from Nasopharyngeal swab specimens and  should not be used as a sole basis for treatment. Nasal washings and  aspirates are unacceptable for Xpert Xpress SARS-CoV-2/FLU/RSV  testing.  Fact Sheet for Patients: https://www.moore.com/  Fact Sheet for Healthcare Providers: https://www.young.biz/  This test is not yet approved or cleared by the [place] FDA and  has been authorized for detection and/or diagnosis of SARS-CoV-2 by  FDA under an Emergency Use Authorization (EUA). This EUA will remain  in effect (meaning this test can be used) for the duration of the  Covid-19 declaration under Section 564(b)(1) of the Act, 21  U.S.C. section 360bbb-3(b)(1), unless the authorization is  terminated or revoked. Performed at [facility], [address], [place], [place] [address]       Radiology Studies: CT Head Wo Contrast  Result Date:  07/20/2020 CLINICAL DATA:  Dizziness EXAM: CT HEAD WITHOUT CONTRAST TECHNIQUE: Contiguous axial images were obtained from the base of the skull through the vertex without intravenous contrast. COMPARISON:  05/12/2017 head CT. FINDINGS: Brain: No intracranial hemorrhage. No mass lesion. No midline shift, ventriculomegaly or extra-axial fluid collection. Cerebral volume is within normal limits. Bilateral periatrial white matter hypodensities. Vascular: No hyperdense vessel or unexpected calcification. Skull: Negative for fracture or focal lesion. Sinuses/Orbits: Normal orbits. Clear paranasal sinuses. No mastoid effusion. Other: None. IMPRESSION:  Nonspecific bilateral periatrial white matter hypodensities. Differential includes demyelination, age indeterminate insults, post infectious/inflammatory sequela and vasculopathy. MRI brain with and without contrast is recommended. Electronically Signed   By: Stana Bunting M.D.   On: 07/20/2020 14:47   MR Brain W and Wo Contrast  Result Date: 07/20/2020 CLINICAL DATA:  34 year old male with new dizziness. Abnormal appearance of the cerebral white matter on head CT earlier today. EXAM: MRI HEAD WITHOUT AND WITH CONTRAST TECHNIQUE: Multiplanar, multiecho pulse sequences of the brain and surrounding structures were obtained without and with intravenous contrast. CONTRAST:  10mL GADAVIST GADOBUTROL 1 MMOL/ML IV SOLN COMPARISON:  Head CT 1439 hours. FINDINGS: Brain: Patchy and confluent abnormal T2 and FLAIR hyperintensity in the bilateral periventricular white matter. Temporal lobe involvement greater on the left. Corpus callosum volume loss is associated. White Matter lesions in the right corona radiata and centrum semiovale demonstrate patchy diffusion restriction (series 5, image 83) and indistinct petechial and leading edge type enhancement (series 19 images 32 through 34). No other diffusion restricted or enhancing lesions, but there is also advanced involvement  of the right cerebellar peduncle and adjacent brainstem (series 11, image 6 and series 9, image 6). No cortical involvement identified. The corona radiata involvement does track toward the posterior limbs of the internal capsules on both sides. The deep gray nuclei appear spared. No superimposed restricted diffusion suggestive of acute infarction. No midline shift, mass effect, evidence of mass lesion, ventriculomegaly, extra-axial collection or acute intracranial hemorrhage. No chronic cerebral blood products. Cervicomedullary junction and pituitary are within normal limits. No other abnormal enhancement. No dural thickening identified. Vascular: Major intracranial vascular flow voids are preserved. The major dural venous sinuses are enhancing and appear to be patent. Skull and upper cervical spine: Grossly normal visible cervical spine, spinal cord. Visualized bone marrow signal is within normal limits. Sinuses/Orbits: Mildly Disconjugate gaze but otherwise negative orbits. Paranasal sinuses and mastoids are stable and well pneumatized. Other: Visible internal auditory structures appear normal. Scalp and face appear negative. 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 Active demyelination in the right hemisphere white matter. Conspicuous chronic involvement of the right brainstem and cerebellar peduncle. Electronically Signed   By: Odessa Fleming M.D.   On: 07/20/2020 21:18   MR CERVICAL SPINE W WO CONTRAST  Result Date: 07/21/2020 CLINICAL DATA:  Myelopathy, acute or progressive. EXAM: MRI CERVICAL AND THORACIC SPINE WITHOUT AND WITH CONTRAST TECHNIQUE: Multiplanar and multiecho pulse sequences of the cervical spine, to include the craniocervical junction and cervicothoracic junction, and the thoracic spine, were obtained without and with intravenous contrast. CONTRAST:  10mL GADAVIST GADOBUTROL 1 MMOL/ML IV SOLN COMPARISON:  07/20/2020 MRI  head. FINDINGS: MRI CERVICAL SPINE FINDINGS Alignment: Normal. Vertebrae: Normal bone marrow signal intensity. No focal osseous lesion. No abnormal enhancement. Cord: Normal cord morphology. Multifocal T2 hyperintense cord lesions at the C2-3, C4, C5 and C6-7 level. Small focal enhancement within the anterior cord at the C3 level. No restricted diffusion. Posterior Fossa, vertebral arteries: Please see recent MRI head. Normal flow voids. Disc levels: Disc spaces are preserved. C2-3: Negative C3-4: Negative C4-5: Negative C5-6: Small disc osteophyte complex with superimposed right foraminal and central protrusions. Patent spinal canal and left neural foramen. Mild right neural foraminal narrowing. C6-7: Negative C7-T1: Left predominant uncovertebral and facet degenerative spurring with mild left neural foraminal narrowing. Patent spinal canal and right neural foramen. Paraspinal tissues: Negative. MRI THORACIC SPINE FINDINGS Alignment:  Normal. Vertebrae: Normal bone  marrow signal intensity. No focal osseous lesions. Cord: Normal cord morphology. T2 hyperintense central cord lesion at the T4 level. Smaller posterior midline T2 hyperintensity at the T9-10 level (22:29). No cord enhancement. Paraspinal and other soft tissues: Negative. Disc levels: No significant spinal canal or neural foraminal narrowing. IMPRESSION: Multifocal cervical cord lesions. Focal anterior cord enhancement at the C3 level may reflect active demyelination. Thoracic cord lesions at the T4 and T9-10 level. No abnormal enhancement to suggest active demyelination. Electronically Signed   By: Stana Bunting M.D.   On: 07/21/2020 07:59   MR THORACIC SPINE W WO CONTRAST  Result Date: 07/21/2020 CLINICAL DATA:  Myelopathy, acute or progressive. EXAM: MRI CERVICAL AND THORACIC SPINE WITHOUT AND WITH CONTRAST TECHNIQUE: Multiplanar and multiecho pulse sequences of the cervical spine, to include the craniocervical junction and cervicothoracic  junction, and the thoracic spine, were obtained without and with intravenous contrast. CONTRAST:  69mL GADAVIST GADOBUTROL 1 MMOL/ML IV SOLN COMPARISON:  07/20/2020 MRI head. FINDINGS: MRI CERVICAL SPINE FINDINGS Alignment: Normal. Vertebrae: Normal bone marrow signal intensity. No focal osseous lesion. No abnormal enhancement. Cord: Normal cord morphology. Multifocal T2 hyperintense cord lesions at the C2-3, C4, C5 and C6-7 level. Small focal enhancement within the anterior cord at the C3 level. No restricted diffusion. Posterior Fossa, vertebral arteries: Please see recent MRI head. Normal flow voids. Disc levels: Disc spaces are preserved. C2-3: Negative C3-4: Negative C4-5: Negative C5-6: Small disc osteophyte complex with superimposed right foraminal and central protrusions. Patent spinal canal and left neural foramen. Mild right neural foraminal narrowing. C6-7: Negative C7-T1: Left predominant uncovertebral and facet degenerative spurring with mild left neural foraminal narrowing. Patent spinal canal and right neural foramen. Paraspinal tissues: Negative. MRI THORACIC SPINE FINDINGS Alignment:  Normal. Vertebrae: Normal bone marrow signal intensity. No focal osseous lesions. Cord: Normal cord morphology. T2 hyperintense central cord lesion at the T4 level. Smaller posterior midline T2 hyperintensity at the T9-10 level (22:29). No cord enhancement. Paraspinal and other soft tissues: Negative. Disc levels: No significant spinal canal or neural foraminal narrowing. IMPRESSION: Multifocal cervical cord lesions. Focal anterior cord enhancement at the C3 level may reflect active demyelination. Thoracic cord lesions at the T4 and T9-10 level. No abnormal enhancement to suggest active demyelination. Electronically Signed   By: Stana Bunting M.D.   On: 07/21/2020 07:59    Scheduled Meds: . amLODipine  5 mg Oral Daily  . cholecalciferol  400 Units Oral Daily  . enoxaparin (LOVENOX) injection  40 mg  Subcutaneous Q24H  . pantoprazole  40 mg Oral Daily   Continuous Infusions: . sodium chloride 75 mL/hr at 07/20/20 2248  . [START ON 07/22/2020] methylPREDNISolone (SOLU-MEDROL) injection    . potassium PHOSPHATE IVPB (in mmol) 20 mmol (07/21/20 1002)     LOS: 0 days   Time spent: 35 minutes   Hughie Closs, MD [facility]  07/21/2020, 11:13 AM   To contact the attending provider between 7A-7P or the covering provider during after hours 7P-7A, please log into the web site [email or web address].

## 2020-07-21 NOTE — Progress Notes (Signed)
Pt admitted to unit from ED. Walked from stretcher to bed with 2 assist. No c/o of pain or nausea. Spouse was at bedside. Pt and spouse oriented to unit and informed of visiting hours.

## 2020-07-22 DIAGNOSIS — I10 Essential (primary) hypertension: Secondary | ICD-10-CM | POA: Diagnosis not present

## 2020-07-22 LAB — CBC WITH DIFFERENTIAL/PLATELET
Abs Immature Granulocytes: 0.06 10*3/uL (ref 0.00–0.07)
Basophils Absolute: 0 10*3/uL (ref 0.0–0.1)
Basophils Relative: 0 %
Eosinophils Absolute: 0 10*3/uL (ref 0.0–0.5)
Eosinophils Relative: 0 %
HCT: 44.3 % (ref 39.0–52.0)
Hemoglobin: 14.8 g/dL (ref 13.0–17.0)
Immature Granulocytes: 0 %
Lymphocytes Relative: 12 %
Lymphs Abs: 1.7 10*3/uL (ref 0.7–4.0)
MCH: 29.1 pg (ref 26.0–34.0)
MCHC: 33.4 g/dL (ref 30.0–36.0)
MCV: 87 fL (ref 80.0–100.0)
Monocytes Absolute: 0.7 10*3/uL (ref 0.1–1.0)
Monocytes Relative: 5 %
Neutro Abs: 11.8 10*3/uL — ABNORMAL HIGH (ref 1.7–7.7)
Neutrophils Relative %: 83 %
Platelets: 416 10*3/uL — ABNORMAL HIGH (ref 150–400)
RBC: 5.09 MIL/uL (ref 4.22–5.81)
RDW: 15 % (ref 11.5–15.5)
WBC: 14.3 10*3/uL — ABNORMAL HIGH (ref 4.0–10.5)
nRBC: 0 % (ref 0.0–0.2)

## 2020-07-22 LAB — GLUCOSE, CAPILLARY
Glucose-Capillary: 138 mg/dL — ABNORMAL HIGH (ref 70–99)
Glucose-Capillary: 151 mg/dL — ABNORMAL HIGH (ref 70–99)
Glucose-Capillary: 140 mg/dL — ABNORMAL HIGH (ref 70–99)
Glucose-Capillary: 158 mg/dL — ABNORMAL HIGH (ref 70–99)
Glucose-Capillary: 108 mg/dL — ABNORMAL HIGH (ref 70–99)

## 2020-07-22 LAB — COMPREHENSIVE METABOLIC PANEL
ALT: 44 U/L (ref 0–44)
AST: 31 U/L (ref 15–41)
Albumin: 3.8 g/dL (ref 3.5–5.0)
Alkaline Phosphatase: 61 U/L (ref 38–126)
Anion gap: 8 (ref 5–15)
BUN: 18 mg/dL (ref 6–20)
CO2: 27 mmol/L (ref 22–32)
Calcium: 9.9 mg/dL (ref 8.9–10.3)
Chloride: 104 mmol/L (ref 98–111)
Creatinine, Ser: 1.39 mg/dL — ABNORMAL HIGH (ref 0.61–1.24)
GFR, Estimated: >60 mL/min (ref >60)
Glucose, Bld: 136 mg/dL — ABNORMAL HIGH (ref 70–99)
Potassium: 5.1 mmol/L (ref 3.5–5.1)
Sodium: 139 mmol/L (ref 135–145)
Total Bilirubin: 0.5 mg/dL (ref 0.3–1.2)
Total Protein: 7.7 g/dL (ref 6.5–8.1)

## 2020-07-22 LAB — PHOSPHORUS: Phosphorus: 2.8 mg/dL (ref 2.5–4.6)

## 2020-07-22 NOTE — Progress Notes (Signed)
Inpatient Rehab Admissions Coordinator Note:   Per PT/OT recommendations, pt was screened for CIR candidacy by Wolfgang Phoenix, MS, CCC-SLP.  At this time we are recommending an Inpatient Rehab consult.  AC will place consult order per protocol.  Please contact me with questions.    Wolfgang Phoenix, MS, CCC-SLP Admissions Coordinator [phone] 07/22/20 4:50 PM

## 2020-07-22 NOTE — Evaluation (Signed)
Occupational Therapy Evaluation Patient Details Name: Cody Rodriguez MRN: [id number] DOB: [birth year] Today's Date: 07/22/2020    History of Present Illness This 34 y.o. male admitted with dizziness, visual changes, and balance difficulties.  MRI of cervical and thoracic spine whowed multifocal cervical cord lesions.  Focal anterior cord enhancement at C3 may reflect active demelination.  Thoracic cord lesions T4 and T9-10.  MRI of brasin showed multi focal T2 change in multiple brain areas with single enhancing lesion meeting criteria for MS.  PMH includes: rhabdomyolysis, low back pain, s/p Lt RCR    Clinical Impression   Pt admitted with above. He demonstrates the below listed deficits and will benefit from continued OT to maximize safety and independence with BADLs.  Pt presents to OT with generalized weakness, decreased activity tolerance, impaired balance, ataxia, and nystagmus with Lt gaze.  He currently requires min A +2 for LB ADLs, and min A +2 for functional mobility and is at significant risk for falls due to ataxia.  He reports he lives with his fiancee' and has a 3y.o. son.  His mother can also assist at discharge.  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  Feel he would benefit from CIR level rehab to maximize his independence with ADLs and functional mobility.  Will follow acutely.       Follow Up Recommendations  CIR;Supervision/Assistance - 24 hour    Equipment Recommendations  Tub/shower bench    Recommendations for Other Services Rehab consult     Precautions / Restrictions Precautions Precautions: Fall Precaution Comments: ataxia noted       Mobility Bed Mobility Overal bed mobility: Needs Assistance Bed Mobility: Supine to Sit     Supine to sit: Supervision     General bed mobility comments: reliant on bedrail   Transfers Overall transfer level: Needs assistance Equipment used: Rolling walker (2 wheeled) Transfers: Sit to/from  [employer] Sit to Stand: Min assist;+2 safety/equipment Stand pivot transfers: Min assist;+2 physical assistance;+2 safety/equipment       General transfer comment: Pt ataxic and with LOB to the Lt     Balance Overall balance assessment: Needs assistance Sitting-balance support: Feet supported Sitting balance-Leahy Scale: Good     Standing balance support: No upper extremity supported Standing balance-Leahy Scale: Poor Standing balance comment: requires min A for static standing                            ADL either performed or assessed with clinical judgement   ADL Overall ADL's : Needs assistance/impaired Eating/Feeding: Modified independent;Sitting   Grooming: Wash/dry hands;Wash/dry face;Oral care;Minimal assistance;Standing   Upper Body Bathing: Set up;Sitting   Lower Body Bathing: Minimal assistance;Sit to/from stand   Upper Body Dressing : Set up;Supervision/safety;Sitting   Lower Body Dressing: Minimal assistance;Sit to/from stand;+2 for safety/equipment   Toilet Transfer: Minimal assistance;+2 for physical assistance;+2 for safety/equipment;Ambulation;Comfort height toilet;Grab bars;RW   Toileting- Clothing Manipulation and Hygiene: Minimal assistance;Sit to/from stand       Functional mobility during ADLs: Minimal assistance;+2 for physical assistance;+2 for safety/equipment       Vision Baseline Vision/History: No visual deficits Patient Visual Report: Diplopia (he reports diplopia has resolved ) Vision Assessment?: Yes Eye Alignment: Within Functional Limits Ocular Range of Motion: Within Functional Limits Alignment/Gaze Preference: Within Defined Limits Tracking/Visual Pursuits: Decreased smoothness of horizontal tracking Additional Comments: nystagmus noted with Lt gaze      Perception Perception Perception Tested?:  Yes   Praxis Praxis Praxis tested?: Within functional limits    Pertinent Vitals/Pain Pain  Assessment: No/denies pain     Hand Dominance Right   Extremity/Trunk Assessment Upper Extremity Assessment Upper Extremity Assessment: RUE deficits/detail;LUE deficits/detail RUE Deficits / Details: mild dysmetria  RUE Coordination: decreased gross motor LUE Deficits / Details: dysmetria noted  LUE Coordination: decreased gross motor;decreased fine motor   Lower Extremity Assessment Lower Extremity Assessment: Defer to PT evaluation   Cervical / Trunk Assessment Cervical / Trunk Assessment: Other exceptions (truncal ataxia noted )   Communication Communication Communication: No difficulties   Cognition Arousal/Alertness: Awake/alert Behavior During Therapy: WFL for tasks assessed/performed Overall Cognitive Status: Within Functional Limits for tasks assessed                                 General Comments: WFL for basic info    General Comments       Exercises     Shoulder Instructions      Home Living Family/patient expects to be discharged to:: Private residence Living Arrangements: Spouse/significant other;Children Available Help at Discharge: Family;Available 24 hours/day Type of Home: House Home Access: Stairs to enter [employer] of Steps: 2   Home Layout: One level     Bathroom Shower/Tub: Tub/shower unit;Curtain   Firefighter: Standard     Home Equipment: Environmental consultant - 2 wheels;Gilmer Mor - single point   Additional Comments: Pt reports he lives with his fiancee' and has a 35 y.o. son.  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  He has been using a RW or SPC for several days PTA due to unsteadiness         OT Problem List: Decreased strength;Decreased activity tolerance;Impaired balance (sitting  and/or standing);Impaired vision/perception;Decreased coordination;Decreased knowledge of use of DME or AE;Impaired UE functional use      OT Treatment/Interventions: Self-care/ADL training;Neuromuscular education;DME and/or AE instruction;Therapeutic activities;Visual/perceptual remediation/compensation;Patient/family education;Balance training    OT Goals(Current goals can be found in the care plan section) Acute Rehab OT Goals Patient Stated Goal: to be able to chase his 34 y.o.  OT Goal Formulation: With patient Time For Goal Achievement: 07/29/20 Potential to Achieve Goals: Good ADL Goals Pt Will Perform Grooming: with supervision;standing Pt Will Perform Lower Body Bathing: with supervision;sit to/from stand Pt Will Perform Lower Body Dressing: with supervision;sit to/from stand Pt Will Transfer to Toilet: with min guard assist;ambulating;regular height toilet;bedside commode;grab bars Pt Will Perform Toileting - Clothing Manipulation and hygiene: with supervision;sit to/from stand Pt Will Perform Tub/Shower Transfer: Tub transfer;ambulating;tub bench;rolling walker  OT Frequency: Min 2X/week   Barriers to D/C:            Co-evaluation PT/OT/SLP Co-Evaluation/Treatment: Yes Reason for Co-Treatment: For patient/therapist safety;To address functional/ADL transfers   OT goals addressed during session: ADL's and self-care      AM-PAC OT "6 Clicks" Daily Activity     Outcome Measure Help from another person eating meals?: None Help from another person taking care of personal grooming?: A Lot Help from another person toileting, which includes using toliet, bedpan, or urinal?: A Lot Help from another person bathing (including washing, rinsing, drying)?: A Little Help from another person to put  on and taking off regular upper body clothing?: A Little Help from another person to put on and taking off regular lower body clothing?: A Little 6 Click Score: 17   End of Session  Equipment Utilized During Treatment: Gait belt;Rolling walker Nurse Communication: Mobility status  Activity Tolerance: Patient tolerated treatment well Patient left: in chair;with call bell/phone within reach;with bed alarm set  OT Visit Diagnosis: Ataxia, unspecified (R27.0)                Time: 6948-5462 OT Time Calculation (min): 24 min Charges:  OT General Charges $OT Visit: 1 Visit OT Evaluation $OT Eval Moderate Complexity: 1 Mod  Kesean Serviss C., OTR/L Acute Rehabilitation Services Pager [phone] Office [phone]   Jeani Hawking M 07/22/2020, 12:44 PM

## 2020-07-22 NOTE — Progress Notes (Signed)
NEUROLOGY CONSULTATION PROGRESS NOTE   Date of service: July 22, 2020 Patient Name: Cody Rodriguez MRN:  [id number] DOB:  23-Sep-1986  Brief HPI   Briefly, Cody Rodriguez is a 34 year old male with multifocal T2 change in multiple brain areas with a single enhancing lesion. Meets the criteria for separation in time and space by both MRI and clinical criteria consistent with multiple sclerosis.  Workup here with MR C and T spine with multifocal cervical cord lesions. Focal anterior cord enhancement at the C3 level may reflect active demyelination. Thoracic cord lesions at the T4 and T9-10 level. No abnormal enhancement to suggest active demyelination.   Interval Hx   He feels a little better today. Looking forward to working with therapy.  Vitals   Vitals:   07/21/20 2024 07/22/20 0001 07/22/20 0423 07/22/20 0847  BP: 137/84 125/81 100/69 136/81  Pulse: 89 83 75 79  Resp: 18 19 17 18   Temp: 97.6 F (36.4 C) 99.1 F (37.3 C) 98.6 F (37 C) 97.8 F (36.6 C)  TempSrc: Oral Axillary  Oral  SpO2: 96% 98% 98% 97%  Weight:      Height:         Body mass index is 36.73 kg/m.  Physical Exam   General: Laying comfortably in bed; in no acute distress. HENT: Normal oropharynx and mucosa. Normal external appearance of ears and nose. Neck: Supple, no pain or tenderness  CV: No JVD. No peripheral edema.  Pulmonary: Symmetric Chest rise. Normal respiratory effort.  Abdomen: Soft to touch, non-tender.  Ext: No cyanosis, edema, or deformity  Skin: No rash. Normal palpation of skin.   Musculoskeletal: Normal digits and nails by inspection. No clubbing.   Neurologic Examination  Mental status/Cognition: Alert, oriented to self, place, month and year, good attention.  Speech/language: Fluent, comprehension intact, object naming intact, repetition intact. Cranial nerves:   CN II Pupils equal and reactive to light, no VF deficits   CN III,IV,VI EOM intact, no gaze preference or  deviation, gaze evoked nystagmus on looking to his left.   CN V normal sensation in V1, V2, and V3 segments bilaterally    CN VII no asymmetry, no nasolabial fold flattening   CN VIII normal hearing to speech   CN IX & X normal palatal elevation, no uvular deviation   CN XI 5/5 head turn and 5/5 shoulder shrug bilaterally   CN XII midline tongue protrusion   Motor:  Muscle bulk: normal, tone normal, pronator drift none. Mvmt Root Nerve  Muscle Right Left Comments  SA C5/6 Ax Deltoid 5 5   EF C5/6 Mc Biceps 5 5   EE C6/7/8 Rad Triceps 5 5   WF C6/7 Med FCR 5 5   WE C7/8 PIN ECU 5 5   F Ab C8/T1 U ADM/FDI 5 5   HF L1/2/3 Fem Illopsoas 5 5   KE L2/3/4 Fem Quad 5 5   DF L4/5 D Peron Tib Ant 5 5   PF S1/2 Tibial Grc/Sol 5 5    Reflexes:  Right Left Comments  Pectoralis      Biceps (C5/6) 3 3   Brachioradialis (C5/6) 3 3    Triceps (C6/7) 3 3    Patellar (L3/4) 3 3 Cross adductors +   Achilles (S1) 3 4 2-3 bets of clonus on left. None on the R   Hoffman + +    Plantar mute upgoing   Jaw jerk    Sensation:  Light touch Intact throughout  Pin prick    Temperature    Vibration   Proprioception    Coordination/Complex Motor:  - Finger to Nose ataxia BL with intention tremor that is more prominent on the left than right. - Heel to shin with ataxia BL, worse in the left again. - Rapid alternating movement are normal - Gait: Deferred walking 2/2 his ataxia.  Labs   Basic Metabolic Panel:  Lab Results  Component Value Date   NA 139 07/22/2020   K 5.1 07/22/2020   CO2 27 07/22/2020   GLUCOSE 136 (H) 07/22/2020   BUN 18 07/22/2020   CREATININE 1.39 (H) 07/22/2020   CALCIUM 9.9 07/22/2020   GFRNONAA >60 07/22/2020   GFRAA 93 07/12/2020   HbA1c:  Lab Results  Component Value Date   HGBA1C 5.8 (H) 07/12/2020   LDL:  Lab Results  Component Value Date   LDLCALC 146 (H) 07/12/2020   Urine Drug Screen:     Component Value Date/Time   LABOPIA NONE DETECTED  11/25/2019 0731   COCAINSCRNUR NONE DETECTED 11/25/2019 0731   LABBENZ NONE DETECTED 11/25/2019 0731   AMPHETMU NONE DETECTED 11/25/2019 0731   THCU NONE DETECTED 11/25/2019 0731   LABBARB NONE DETECTED 11/25/2019 0731    Alcohol Level No results found for: ETH No results found for: PHENYTOIN, ZONISAMIDE, LAMOTRIGINE, LEVETIRACETA No results found for: PHENYTOIN, PHENOBARB, VALPROATE, CBMZ  Imaging and Diagnostic studies   MRI Brain with and without contrast: Multifocal signal abnormality in the brain most compatible with acute on chronic demyelinating disease. Active demyelination in the right hemisphere white matter. Conspicuous chronic involvement of the right brainstem and cerebellar peduncle.  MRI Cervical spine with and without contrast: Multifocal cervical cord lesions. Focal anterior cord enhancement at the C3 level may reflect active demyelination. Thoracic cord lesions at the T4 and T9-10 level. No abnormal enhancement to suggest active demyelination.  MRI Thoracic spine with and without contrast: Multifocal cervical cord lesions. Focal anterior cord enhancement at the C3 level may reflect active demyelination. Thoracic cord lesions at the T4 and T9-10 level. No abnormal enhancement to suggest active demyelination.  Impression   Cody Rodriguez is a 34 y.o. male with new diagnosis of multiple sclerosis presenting with vertigo and off balance with difficulty walking. MR Brain with an actively enhancing lesion in the R hemispheric white matter. Neuro exam with ataxia and intention tremor with BL lower extremity hyperreflexia with left worse than right. Likely due to MS Exacerbation.  Diagnosis: - MS Exacerbation.  Recommendations  - IV Solumderol 1000mg  IV daily x 5 days, ends 10/21. - Vitamin D 400 units daily PO - Protonix 40mg  daily while on steroids - PT and OT, he will be an excellent candidate for inpatient  rehab. ______________________________________________________________________   Thank you for the opportunity to take part in the care of this patient. If you have any further questions, please contact the neurology consultation attending.  Signed,  11/21 [facility] Pager Number 

## 2020-07-22 NOTE — Evaluation (Signed)
Physical Therapy Evaluation Patient Details Name: Cody Rodriguez MRN: [id number] DOB: [birth year] Today's Date: 07/22/2020   History of Present Illness  This 34 y.o. male admitted with dizziness, visual changes, and balance difficulties.  MRI of cervical and thoracic spine showed multifocal cervical cord lesions.  Focal anterior cord enhancement at C3 may reflect active demelination.  Thoracic cord lesions T4 and T9-10.  MRI of brasin showed multi focal T2 change in multiple brain areas with single enhancing lesion meeting criteria for MS.  PMH includes: rhabdomyolysis, low back pain, s/p Lt RCR  Clinical Impression  Patient presents with ataxia, impaired balance, decreased activity tolerance, left sided weakness and impaired mobility s/p above. Pt lives with fiance and 70 y/o son and was working at [employer] PTA. Today, pt requires Min A for standing due to ataxia, left lean and imbalance as well as Min A of 2 for gait training and use of RW. Noted to have difficulty progressing LLE esp when fatigued and needed cues to for RW management/proximity. A few standing rest breaks needed due to fatigue. High fall risk at this time. Has support from family. Would benefit from CIR to maximize independence and mobility prior to return home. Will follow acutely.     Follow Up Recommendations CIR;Supervision for mobility/OOB    Equipment Recommendations  None recommended by PT    Recommendations for Other Services Rehab consult     Precautions / Restrictions Precautions Precautions: Fall Precaution Comments: ataxia Restrictions Weight Bearing Restrictions: No      Mobility  Bed Mobility Overal bed mobility: Needs Assistance Bed Mobility: Supine to Sit     Supine to sit: Supervision;HOB elevated     General bed mobility comments: reliant on bedrail   Transfers Overall transfer level: Needs assistance Equipment used: Rolling walker (2 wheeled) Transfers: Sit to/from Stand Sit to Stand: Min  assist;+2 safety/equipment Stand pivot transfers: Min assist;+2 physical assistance;+2 safety/equipment       General transfer comment: Pt ataxic and with LOB to the Left upon standing needing support.  Ambulation/Gait Ambulation/Gait assistance: Min assist;+2 safety/equipment;+2 physical assistance Gait Distance (Feet): 75 Feet Assistive device: Rolling walker (2 wheeled) Gait Pattern/deviations: Step-through pattern;Decreased dorsiflexion - left;Decreased stride length;Staggering right;Ataxic Gait velocity: decreased   General Gait Details: Slow, unsteady gait veering towards the right with the RW and body favoring left to position self outside RW; decreased LLE clearance esp when fatigued. Ataxic throughout. Mutliple standing rest breaks due to weakness/fatigue, difficulty with turns.  Stairs            Wheelchair Mobility    Modified Rankin (Stroke Patients Only)       Balance Overall balance assessment: Needs assistance Sitting-balance support: Feet supported;No upper extremity supported Sitting balance-Leahy Scale: Good     Standing balance support: During functional activity Standing balance-Leahy Scale: Poor Standing balance comment: requires min A for static standing, left lateral lean.                             Pertinent Vitals/Pain Pain Assessment: No/denies pain    Home Living Family/patient expects to be discharged to:: Private residence Living Arrangements: Spouse/significant other;Children (3 y/o and fiance) Available Help at Discharge: Family;Available 24 hours/day Type of Home: House Home Access: Stairs to enter Entrance Stairs-Rails: None Entrance Stairs-Number of Steps: 2 Home Layout: One level Home Equipment: Walker - 2 wheels;[employer] - single point Additional Comments: Pt reports he lives with his fiancee' and has a  3 y.o. son.  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  He has been using a RW or SPC for several days PTA due to unsteadiness      Hand Dominance   Dominant Hand: Right    Extremity/Trunk Assessment   Upper Extremity Assessment Upper Extremity Assessment: Defer to OT evaluation RUE Deficits / Details: mild dysmetria  RUE Coordination: decreased gross motor LUE Deficits / Details: dysmetria noted  LUE Coordination: decreased gross motor;decreased fine motor    Lower Extremity Assessment Lower Extremity Assessment: RLE deficits/detail;LLE deficits/detail RLE Deficits / Details: pins/needles mid shin to foot RLE Sensation: decreased light touch LLE Deficits / Details: pins/needles mid shin to foot; grossly ~3/5 hip flexion, 4/5 throughout knee/ankle. LLE Sensation: decreased light touch    Cervical / Trunk Assessment Cervical / Trunk Assessment: Other exceptions Cervical / Trunk Exceptions: truncal ataxia noted  Communication   Communication: No difficulties  Cognition Arousal/Alertness: Awake/alert Behavior During Therapy: WFL for tasks assessed/performed Overall Cognitive Status: Within Functional Limits for tasks assessed                                 General Comments: WFL for basic info       General Comments General comments (skin integrity, edema, etc.): Reports vision is improved; no diploplia. Some left beating nystagmus noted with visual testing.    Exercises     Assessment/Plan    PT Assessment Patient needs continued PT services  PT Problem List Decreased strength;Decreased mobility;Decreased coordination;Decreased activity tolerance;Decreased balance;Impaired sensation       PT Treatment Interventions Therapeutic activities;Gait training;Therapeutic exercise;Patient/family education;Balance training;Functional mobility training;Stair  training;Neuromuscular re-education    PT Goals (Current goals can be found in the Care Plan section)  Acute Rehab PT Goals Patient Stated Goal: to be able to chase his 34 y.o.  PT Goal Formulation: With patient Time For Goal Achievement: 08/05/20 Potential to Achieve Goals: Good    Frequency Min 3X/week   Barriers to discharge        Co-evaluation PT/OT/SLP Co-Evaluation/Treatment: Yes Reason for Co-Treatment: For patient/therapist safety;To address functional/ADL transfers PT goals addressed during session: Mobility/safety with mobility OT goals addressed during session: ADL's and self-care       AM-PAC PT "6 Clicks" Mobility  Outcome Measure Help needed turning from your back to your side while in a flat bed without using bedrails?: None Help needed moving from lying on your back to sitting on the side of a flat bed without using bedrails?: None Help needed moving to and from a bed to a chair (including a wheelchair)?: A Little Help needed standing up from a chair using your arms (e.g., wheelchair or bedside chair)?: A Little Help needed to walk in hospital room?: A Little Help needed climbing 3-5 steps with a railing? : A Little 6 Click Score: 20    End of Session Equipment Utilized During Treatment: Gait belt Activity Tolerance: Patient tolerated treatment well;Patient limited by fatigue Patient left: in chair;with call bell/phone within reach;with chair alarm set Nurse Communication: Mobility status PT Visit Diagnosis: Unsteadiness on feet (R26.81);Ataxic gait (R26.0);Muscle weakness (generalized) (M62.81);Other abnormalities of gait and mobility (R26.89)    Time: 4481-8563 PT Time  Calculation (min) (ACUTE ONLY): 25 min   Charges:   PT Evaluation $PT Eval Moderate Complexity: 1 Mod          Vale Haven, PT, DPT Acute Rehabilitation Services Pager [phone] Office [phone]      Blake Divine A Lanier Ensign 07/22/2020, 2:05 PM

## 2020-07-22 NOTE — Progress Notes (Signed)
PROGRESS NOTE    Cody Rodriguez  NWG:[id number] DOB: 1986/05/31 DOA: 07/20/2020 PCP: System, Provider Not In   Brief Narrative:  Cody Rodriguez is a 34 y.o. male with medical history significant of gynecomastia, hypertension, chronic lower back pain, rhabdomyolysis, history of upper extremities and RLE paresthesias since about 4 years presented to ED due to above chief complaint for the past 3 days.  He has had these symptoms for more than 4 years and he was seen by Dr. Lucia Gaskins in January of 2017, but was unable to proceed with work-up due to lack of health insurance.  Since then, he has had them on and off, but the past few days have been more intense.  Patient has been having some trouble with his balance and ambulation.  He also was having a left-sided frontal headache and blurred vision since the day of admission.  No other complaint.  Upon arrival to ED, he was hemodynamically stable. The patient was given a 1000 mL NS bolus, 25 mg of meclizine by the ED providers and 1 g of methylprednisolone was ordered by Dr. Amada Jupiter. Coronavirus PCR was negative.  Labs were within normal range.  He was diagnosed with MS based on the MRI results and was transferred to [facility] for high-dose Solu-Medrol.  Assessment & Plan:   Principal Problem:   Unsteady gait Active Problems:   Thrombocytosis   Elevated serum creatinine   Hyperglycemia   Magnetic resonance imaging of brain abnormal   Hypertension   Multiple sclerosis (HCC)   Possible MS: Per patient, he initially started having some paresthesia and weakness which was displaced from time and location in 2017. For this, he followed a physician and was referred for a brain MRI but he never got to it due to financial reasons. Since then, he has been having issues like that intermittently which will resolve on its own after a week or 2. Never followed up any physician after that. MRI highly suspicious for MS. Neurology consulted and has  started the patient on high-dose Solu-Medrol for total of 5 doses with the first being on 07/20/2020. Appreciate neurology help and defer management to them.  AKI: Resolved.  Thrombocytosis: Improving.  We will repeat labs tomorrow morning.  Hypophosphatemia: Resolved.  Prediabetes: Hemoglobin A1c 5.8 on 07/12/2020.  Continue SSI.  Essential hypertension: Has not taken treatment in a while.  He has been started on amlodipine 5 mg and lisinopril 20 mg here.  Blood pressure stable.    DVT prophylaxis:    Code Status: Full Code  Family Communication:  None present at bedside.  Plan of care discussed with patient in length and he verbalized understanding and agreed with it.  Status is: Observation  The patient will require care spanning > 2 midnights and should be moved to inpatient because: Inpatient level of care appropriate due to severity of illness  Dispo: The patient is from: Home              Anticipated d/c is to: Home              Anticipated d/c date is: 1 day              Patient currently is not medically stable to d/c.        Estimated body mass index is 36.73 kg/m as calculated from the following:   Height as of this encounter: 5\' 10"  (1.778 m).   Weight as of this encounter: 116.1 kg.  Nutritional status:               Consultants:   Neurology  Procedures:   None  Antimicrobials:  Anti-infectives (From admission, onward)   None         Subjective: Seen and examined.  He believes that his strength is coming back.  He has no new complaint.  Objective: Vitals:   07/21/20 2024 07/22/20 0001 07/22/20 0423 07/22/20 0847  BP: 137/84 125/81 100/69 136/81  Pulse: 89 83 75 79  Resp: 18 19 17 18   Temp: 97.6 F (36.4 C) 99.1 F (37.3 C) 98.6 F (37 C) 97.8 F (36.6 C)  TempSrc: Oral Axillary  Oral  SpO2: 96% 98% 98% 97%  Weight:      Height:        Intake/Output Summary (Last 24 hours) at 07/22/2020 1022 Last data filed at  07/22/2020 3382 Gross per 24 hour  Intake 947.8 ml  Output 775 ml  Net 172.8 ml   Filed Weights   07/20/20 1223  Weight: 116.1 kg    Examination:    General exam: Appears calm and comfortable  Respiratory system: Clear to auscultation. Respiratory effort normal.  Bilateral gynecomastia. Cardiovascular system: S1 & S2 heard, RRR. No JVD, murmurs, rubs, gallops or clicks. No pedal edema. Gastrointestinal system: Abdomen is nondistended, soft and nontender. No organomegaly or masses felt. Normal bowel sounds heard. Central nervous system: Alert and oriented. No focal neurological deficits. Extremities: Symmetric 5 x 5 power. Skin: No rashes, lesions or ulcers.  Psychiatry: Judgement and insight appear normal. Mood & affect appropriate.   Data Reviewed: I have personally reviewed following labs and imaging studies  CBC: Recent Labs  Lab 07/20/20 1455 07/22/20 0259  WBC 8.4 14.3*  NEUTROABS 5.7 11.8*  HGB 15.5 14.8  HCT 46.4 44.3  MCV 85.8 87.0  PLT 439* 416*   Basic Metabolic Panel: Recent Labs  Lab 07/20/20 1455 07/21/20 0345 07/22/20 0259  NA 138 137 139  K 3.9 4.4 5.1  CL 100 103 104  CO2 27 23 27   GLUCOSE 101* 134* 136*  BUN 14 11 18   CREATININE 1.25* 1.24 1.39*  CALCIUM 9.8 9.7 9.9  MG  --  2.0  --   PHOS  --  2.3* 2.8   GFR: Estimated Creatinine Clearance: 95.5 mL/min (A) (by C-G formula based on SCr of 1.39 mg/dL (H)). Liver Function Tests: Recent Labs  Lab 07/21/20 0345 07/22/20 0259  AST 58* 31  ALT 46* 44  ALKPHOS 65 61  BILITOT 0.9 0.5  PROT 8.0 7.7  ALBUMIN 4.0 3.8   No results for input(s): LIPASE, AMYLASE in the last 168 hours. No results for input(s): AMMONIA in the last 168 hours. Coagulation Profile: No results for input(s): INR, PROTIME in the last 168 hours. Cardiac Enzymes: No results for input(s): CKTOTAL, CKMB, CKMBINDEX, TROPONINI in the last 168 hours. BNP (last 3 results) No results for input(s): PROBNP in the last 8760  hours. HbA1C: No results for input(s): HGBA1C in the last 72 hours. CBG: Recent Labs  Lab 07/21/20 1137 07/21/20 2107 07/22/20 0621  GLUCAP 179* 127* 138*   Lipid Profile: No results for input(s): CHOL, HDL, LDLCALC, TRIG, CHOLHDL, LDLDIRECT in the last 72 hours. Thyroid Function Tests: No results for input(s): TSH, T4TOTAL, FREET4, T3FREE, THYROIDAB in the last 72 hours. Anemia Panel: No results for input(s): VITAMINB12, FOLATE, FERRITIN, TIBC, IRON, RETICCTPCT in the last 72 hours. Sepsis Labs: No results for input(s): PROCALCITON, LATICACIDVEN in  the last 168 hours.  Recent Results (from the past 240 hour(s))  Respiratory Panel by RT PCR (Flu A&B, Covid) - Nasopharyngeal Swab     Status: None   Collection Time: 07/20/20 10:49 PM   Specimen: Nasopharyngeal Swab  Result Value Ref Range Status   SARS Coronavirus 2 by RT PCR NEGATIVE NEGATIVE Final    Comment: (NOTE) SARS-CoV-2 target nucleic acids are NOT DETECTED.  The SARS-CoV-2 RNA is generally detectable in upper respiratoy specimens during the acute phase of infection. The lowest concentration of SARS-CoV-2 viral copies this assay can detect is 131 copies/mL. A negative result does not preclude SARS-Cov-2 infection and should not be used as the sole basis for treatment or other patient management decisions. A negative result may occur with  improper specimen collection/handling, submission of specimen other than nasopharyngeal swab, presence of viral mutation(s) within the areas targeted by this assay, and inadequate number of viral copies (<131 copies/mL). A negative result must be combined with clinical observations, patient history, and epidemiological information. The expected result is Negative.  Fact Sheet for Patients:  https://www.moore.com/  Fact Sheet for Healthcare Providers:  https://www.young.biz/  This test is no t yet approved or cleared by the [place] FDA  and  has been authorized for detection and/or diagnosis of SARS-CoV-2 by FDA under an Emergency Use Authorization (EUA). This EUA will remain  in effect (meaning this test can be used) for the duration of the COVID-19 declaration under Section 564(b)(1) of the Act, 21 U.S.C. section 360bbb-3(b)(1), unless the authorization is terminated or revoked sooner.     Influenza A by PCR NEGATIVE NEGATIVE Final   Influenza B by PCR NEGATIVE NEGATIVE Final    Comment: (NOTE) The Xpert Xpress SARS-CoV-2/FLU/RSV assay is intended as an aid in  the diagnosis of influenza from Nasopharyngeal swab specimens and  should not be used as a sole basis for treatment. Nasal washings and  aspirates are unacceptable for Xpert Xpress SARS-CoV-2/FLU/RSV  testing.  Fact Sheet for Patients: https://www.moore.com/  Fact Sheet for Healthcare Providers: https://www.young.biz/  This test is not yet approved or cleared by the [place] FDA and  has been authorized for detection and/or diagnosis of SARS-CoV-2 by  FDA under an Emergency Use Authorization (EUA). This EUA will remain  in effect (meaning this test can be used) for the duration of the  Covid-19 declaration under Section 564(b)(1) of the Act, 21  U.S.C. section 360bbb-3(b)(1), unless the authorization is  terminated or revoked. Performed at [facility], [address] [place], [place] [address]       Radiology Studies: CT Head Wo Contrast  Result Date: 07/20/2020 CLINICAL DATA:  Dizziness EXAM: CT HEAD WITHOUT CONTRAST TECHNIQUE: Contiguous axial images were obtained from the base of the skull through the vertex without intravenous contrast. COMPARISON:  05/12/2017 head CT. FINDINGS: Brain: No intracranial hemorrhage. No mass lesion. No midline shift, ventriculomegaly or extra-axial fluid collection. Cerebral volume is within normal limits. Bilateral periatrial white matter hypodensities. Vascular:  No hyperdense vessel or unexpected calcification. Skull: Negative for fracture or focal lesion. Sinuses/Orbits: Normal orbits. Clear paranasal sinuses. No mastoid effusion. Other: None. IMPRESSION: Nonspecific bilateral periatrial white matter hypodensities. Differential includes demyelination, age indeterminate insults, post infectious/inflammatory sequela and vasculopathy. MRI brain with and without contrast is recommended. Electronically Signed   By: Stana Bunting M.D.   On: 07/20/2020 14:47   MR Brain W and Wo Contrast  Result Date: 07/20/2020 CLINICAL DATA:  34 year old male with new dizziness.  Abnormal appearance of the cerebral white matter on head CT earlier today. EXAM: MRI HEAD WITHOUT AND WITH CONTRAST TECHNIQUE: Multiplanar, multiecho pulse sequences of the brain and surrounding structures were obtained without and with intravenous contrast. CONTRAST:  68mL GADAVIST GADOBUTROL 1 MMOL/ML IV SOLN COMPARISON:  Head CT 1439 hours. FINDINGS: Brain: Patchy and confluent abnormal T2 and FLAIR hyperintensity in the bilateral periventricular white matter. Temporal lobe involvement greater on the left. Corpus callosum volume loss is associated. White Matter lesions in the right corona radiata and centrum semiovale demonstrate patchy diffusion restriction (series 5, image 83) and indistinct petechial and leading edge type enhancement (series 19 images 32 through 34). No other diffusion restricted or enhancing lesions, but there is also advanced involvement of the right cerebellar peduncle and adjacent brainstem (series 11, image 6 and series 9, image 6). No cortical involvement identified. The corona radiata involvement does track toward the posterior limbs of the internal capsules on both sides. The deep gray nuclei appear spared. No superimposed restricted diffusion suggestive of acute infarction. No midline shift, mass effect, evidence of mass lesion, ventriculomegaly, extra-axial collection or acute  intracranial hemorrhage. No chronic cerebral blood products. Cervicomedullary junction and pituitary are within normal limits. No other abnormal enhancement. No dural thickening identified. Vascular: Major intracranial vascular flow voids are preserved. The major dural venous sinuses are enhancing and appear to be patent. Skull and upper cervical spine: Grossly normal visible cervical spine, spinal cord. Visualized bone marrow signal is within normal limits. Sinuses/Orbits: Mildly Disconjugate gaze but otherwise negative orbits. Paranasal sinuses and mastoids are stable and well pneumatized. Other: Visible internal auditory structures appear normal. Scalp and face appear negative. 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 Active demyelination in the right hemisphere white matter. Conspicuous chronic involvement of the right brainstem and cerebellar peduncle. Electronically Signed   By: Odessa Fleming M.D.   On: 07/20/2020 21:18   MR CERVICAL SPINE W WO CONTRAST  Result Date: 07/21/2020 CLINICAL DATA:  Myelopathy, acute or progressive. EXAM: MRI CERVICAL AND THORACIC SPINE WITHOUT AND WITH CONTRAST TECHNIQUE: Multiplanar and multiecho pulse sequences of the cervical spine, to include the craniocervical junction and cervicothoracic junction, and the thoracic spine, were obtained without and with intravenous contrast. CONTRAST:  83mL GADAVIST GADOBUTROL 1 MMOL/ML IV SOLN COMPARISON:  07/20/2020 MRI head. FINDINGS: MRI CERVICAL SPINE FINDINGS Alignment: Normal. Vertebrae: Normal bone marrow signal intensity. No focal osseous lesion. No abnormal enhancement. Cord: Normal cord morphology. Multifocal T2 hyperintense cord lesions at the C2-3, C4, C5 and C6-7 level. Small focal enhancement within the anterior cord at the C3 level. No restricted diffusion. Posterior Fossa, vertebral arteries: Please see recent MRI head. Normal flow voids. Disc levels:  Disc spaces are preserved. C2-3: Negative C3-4: Negative C4-5: Negative C5-6: Small disc osteophyte complex with superimposed right foraminal and central protrusions. Patent spinal canal and left neural foramen. Mild right neural foraminal narrowing. C6-7: Negative C7-T1: Left predominant uncovertebral and facet degenerative spurring with mild left neural foraminal narrowing. Patent spinal canal and right neural foramen. Paraspinal tissues: Negative. MRI THORACIC SPINE FINDINGS Alignment:  Normal. Vertebrae: Normal bone marrow signal intensity. No focal osseous lesions. Cord: Normal cord morphology. T2 hyperintense central cord lesion at the T4 level. Smaller posterior midline T2 hyperintensity at the T9-10 level (22:29). No cord enhancement. Paraspinal and other soft tissues: Negative. Disc levels: No significant spinal canal or neural foraminal narrowing. IMPRESSION: Multifocal cervical cord lesions. Focal anterior cord enhancement at  the C3 level may reflect active demyelination. Thoracic cord lesions at the T4 and T9-10 level. No abnormal enhancement to suggest active demyelination. Electronically Signed   By: Stana Bunting M.D.   On: 07/21/2020 07:59   MR THORACIC SPINE W WO CONTRAST  Result Date: 07/21/2020 CLINICAL DATA:  Myelopathy, acute or progressive. EXAM: MRI CERVICAL AND THORACIC SPINE WITHOUT AND WITH CONTRAST TECHNIQUE: Multiplanar and multiecho pulse sequences of the cervical spine, to include the craniocervical junction and cervicothoracic junction, and the thoracic spine, were obtained without and with intravenous contrast. CONTRAST:  49mL GADAVIST GADOBUTROL 1 MMOL/ML IV SOLN COMPARISON:  07/20/2020 MRI head. FINDINGS: MRI CERVICAL SPINE FINDINGS Alignment: Normal. Vertebrae: Normal bone marrow signal intensity. No focal osseous lesion. No abnormal enhancement. Cord: Normal cord morphology. Multifocal T2 hyperintense cord lesions at the C2-3, C4, C5 and C6-7 level. Small focal  enhancement within the anterior cord at the C3 level. No restricted diffusion. Posterior Fossa, vertebral arteries: Please see recent MRI head. Normal flow voids. Disc levels: Disc spaces are preserved. C2-3: Negative C3-4: Negative C4-5: Negative C5-6: Small disc osteophyte complex with superimposed right foraminal and central protrusions. Patent spinal canal and left neural foramen. Mild right neural foraminal narrowing. C6-7: Negative C7-T1: Left predominant uncovertebral and facet degenerative spurring with mild left neural foraminal narrowing. Patent spinal canal and right neural foramen. Paraspinal tissues: Negative. MRI THORACIC SPINE FINDINGS Alignment:  Normal. Vertebrae: Normal bone marrow signal intensity. No focal osseous lesions. Cord: Normal cord morphology. T2 hyperintense central cord lesion at the T4 level. Smaller posterior midline T2 hyperintensity at the T9-10 level (22:29). No cord enhancement. Paraspinal and other soft tissues: Negative. Disc levels: No significant spinal canal or neural foraminal narrowing. IMPRESSION: Multifocal cervical cord lesions. Focal anterior cord enhancement at the C3 level may reflect active demyelination. Thoracic cord lesions at the T4 and T9-10 level. No abnormal enhancement to suggest active demyelination. Electronically Signed   By: Stana Bunting M.D.   On: 07/21/2020 07:59    Scheduled Meds: . amLODipine  5 mg Oral Daily  . cholecalciferol  400 Units Oral Daily  . insulin aspart  0-5 Units Subcutaneous QHS  . insulin aspart  0-9 Units Subcutaneous TID WC  . pantoprazole  40 mg Oral Daily   Continuous Infusions: . sodium chloride 75 mL/hr at 07/20/20 2248  . methylPREDNISolone (SOLU-MEDROL) injection 1,000 mg (07/21/20 2346)     LOS: 1 day   Time spent: 29 minutes   Hughie Closs, MD [facility]  07/22/2020, 10:22 AM   To contact the attending provider between 7A-7P or the covering provider during after hours 7P-7A, please  log into the web site [email or web address].

## 2020-07-23 DIAGNOSIS — R2681 Unsteadiness on feet: Secondary | ICD-10-CM | POA: Diagnosis not present

## 2020-07-23 DIAGNOSIS — I10 Essential (primary) hypertension: Secondary | ICD-10-CM | POA: Diagnosis not present

## 2020-07-23 LAB — GLUCOSE, CAPILLARY
Glucose-Capillary: 131 mg/dL — ABNORMAL HIGH (ref 70–99)
Glucose-Capillary: 142 mg/dL — ABNORMAL HIGH (ref 70–99)
Glucose-Capillary: 243 mg/dL — ABNORMAL HIGH (ref 70–99)
Glucose-Capillary: 161 mg/dL — ABNORMAL HIGH (ref 70–99)
Glucose-Capillary: 103 mg/dL — ABNORMAL HIGH (ref 70–99)

## 2020-07-23 NOTE — TOC Initial Note (Addendum)
Transition of Care [facility]) - Initial/Assessment Note    Patient Details  Name: Cody Rodriguez MRN: [id number] Date of Birth: [birth year]  [facility] CM/SW Contact:    Kermit Balo, RN Phone Number: 07/23/2020, 11:51 AM  Clinical Narrative:                 Recommendations are for CIR. Pt is refusing and wants to attend outpatient therapy at d/c. Pts spouse on the phone when CM was in the room with the patient and she is in agreement with outpatient therapy. Orders in for Neuro Outpatient therapy and information on his AVS.  TOC following for further d/c needs.   1500: PT recommending wheelchair for home. CM has updated [employer] and they will deliver it to the room.  Expected Discharge Plan: OP Rehab Barriers to Discharge: Continued Medical Work up   Patient Goals and CMS Choice   CMS Medicare.gov Compare Post Acute Care list provided to:: Patient Choice offered to / list presented to : Patient, Spouse  Expected Discharge Plan and Services Expected Discharge Plan: OP Rehab   Discharge Planning Services: CM Consult   Living arrangements for the past 2 months: Single Family Home                                      Prior Living Arrangements/Services Living arrangements for the past 2 months: Single Family Home Lives with:: Spouse Patient language and need for interpreter reviewed:: Yes Do you feel safe going back to the place where you live?: Yes      Need for Family Participation in Patient Care: Yes (Comment) Care giver support system in place?: Yes (comment) Current home services: DME (walker/ cane/ 3 in 1) Criminal Activity/Legal Involvement Pertinent to Current Situation/Hospitalization: No - Comment as needed  Activities of Daily Living      Permission Sought/Granted                  Emotional Assessment Appearance:: Appears stated age Attitude/Demeanor/Rapport: Engaged Affect (typically observed): Accepting Orientation: :  Oriented to Self, Oriented to Place, Oriented to  Time, Oriented to Situation   Psych Involvement: No (comment)  Admission diagnosis:  Dizziness [R42] MS (multiple sclerosis) (HCC) [G35] Unsteady gait [R26.81] Abnormal CT of brain [R90.89] Multiple sclerosis (HCC) [G35] Patient Active Problem List   Diagnosis Date Noted  . Hypertension 07/21/2020  . Multiple sclerosis (HCC) 07/21/2020  . Unsteady gait 07/20/2020  . Thrombocytosis 07/20/2020  . Elevated serum creatinine 07/20/2020  . Hyperglycemia 07/20/2020  . Magnetic resonance imaging of brain abnormal 07/20/2020  . Transaminitis 11/25/2019  . Rhabdomyolysis 11/24/2019  . Paresthesia and pain of both upper extremities   . Current smoker 07/02/2014  . Paresthesias 07/02/2014  . Weakness 07/02/2014  . Gynecomastia, male 07/02/2014   PCP:  System, Provider Not In Pharmacy:   [id number] [place], [place] - [address] [place] [address] Phone: [phone] Fax: [phone]     Social Determinants of Health (SDOH) Interventions    Readmission Risk Interventions No flowsheet data found.

## 2020-07-23 NOTE — Progress Notes (Signed)
Inpatient Rehabilitation Admissions Coordinator  I met with patient at bedside with his Mom. He prefers outpatient therapy at this time, not CIR.  Danne Baxter, RN, MSN Rehab Admissions Coordinator [phone] 07/23/2020 1:31 PM

## 2020-07-23 NOTE — Consult Note (Signed)
Physical Medicine and Rehabilitation Consult Reason for Consult: Unsteady gait with sensory deficits as well as lower extremity weakness Referring Physician: [facility]   HPI: Cody Rodriguez is a 34 y.o. right-handed male with history of tobacco abuse per chart review patient lives with his fiance and 34-year-old son.  Works full-time for [employer].  1 level home 2 steps to entry.  His mother is available to assist as needed.  Presented 07/20/2020 to [facility] with progressive gait disturbance x1 week as well as some double vision.  Cranial CT scan showed nonspecific bilateral periatrial white matter hypodensities.  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  Active demyelination in the right hemisphere white matter.  MRI of cervical thoracic spine showed multifocal cervical cord lesions.  Focal anterior cord enhancement at the C3 level reflecting active demyelination.  Thoracic cord lesions at T4 and T9-10 level.  Admission chemistries unremarkable except glucose 101 creatinine 1.25, platelets 439,000.  Neurology consulted placed on IV Solu-Medrol x5 doses ending 07/26/2020.  Therapy evaluations completed with recommendations of physical medicine rehab consult.   Review of Systems  Constitutional: Negative for chills and fever.  HENT: Negative for hearing loss.   Eyes: Positive for blurred vision and double vision.  Respiratory: Negative for cough and shortness of breath.   Cardiovascular: Negative for chest pain, palpitations and leg swelling.  Gastrointestinal: Positive for constipation. Negative for heartburn, nausea and vomiting.  Genitourinary: Negative for dysuria, flank pain and hematuria.  Musculoskeletal: Positive for myalgias.  Skin: Negative for rash.  Neurological: Positive for sensory change and weakness.  All other systems reviewed and are negative.  Past Medical History:  Diagnosis Date   . Gynecomastia, male 07/02/2014  . High blood pressure   . Low back pain   . Multiple sclerosis (HCC) 07/21/2020  . Paresthesia and pain of both upper extremities    also in right leg  . Rhabdomyolysis 11/24/2019  . Swelling    Past Surgical History:  Procedure Laterality Date  . FINGER SURGERY Left    left pinky  . ROTATOR CUFF REPAIR Left    Family History  Problem Relation Age of Onset  . Healthy Mother   . Diabetes Mother   . Hypertension Mother   . Hyperlipidemia Mother   . Sleep apnea Mother   . Hypertension Father   . Hyperlipidemia Father   . Healthy Sister   . Healthy Brother   . Diabetes Maternal Aunt   . Neuropathy Neg Hx   . Multiple sclerosis Neg Hx    Social History:  reports that he has quit smoking. His smoking use included cigarettes. He has a 5.00 pack-year smoking history. He has never used smokeless tobacco. He reports current alcohol use. He reports that he does not use drugs. Allergies: No Known Allergies Medications Prior to Admission  Medication Sig Dispense Refill  . acetaminophen (TYLENOL) 325 MG tablet Take 650 mg by mouth every 6 (six) hours as needed for mild pain, fever or headache.    . meloxicam (MOBIC) 15 MG tablet Take 15 mg by mouth daily as needed for pain.     . Multiple Vitamin (MULTIVITAMIN) capsule Take 1 capsule by mouth daily.    . naproxen (NAPROSYN) 500 MG tablet Take 500 mg by mouth daily as needed for mild pain or headache.       Home: Home Living Family/patient expects to be discharged to:: Private residence  Living Arrangements: Spouse/significant other, Children (3 y/o and fiance) Available Help at Discharge: Family, Available 24 hours/day Type of Home: House Home Access: Stairs to enter [employer] of Steps: 2 Entrance Stairs-Rails: None Home Layout: One level Bathroom Shower/Tub: Tub/shower unit, Engineer, building services: Standard Home Equipment: Environmental consultant - 2 wheels, Cane - single point Additional Comments:  Pt reports he lives with his fiancee' and has a 3 y.o. son.  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  He has been using a RW or SPC for several days PTA due to unsteadiness  Functional Status:  Mobility: Bed Mobility Overal bed mobility: Needs Assistance Bed Mobility: Supine to Sit Supine to sit: Supervision, HOB elevated General bed mobility comments: reliant on bedrail  Transfers Overall transfer level: Needs assistance Equipment used: Rolling walker (2 wheeled) Transfers: Sit to/from Stand Sit to Stand: Min assist, +2 safety/equipment Stand pivot transfers: Min assist, +2 physical assistance, +2 safety/equipment General transfer comment: Pt ataxic and with LOB to the Left upon standing needing support. Ambulation/Gait Ambulation/Gait assistance: Min assist, +2 safety/equipment, +2 physical assistance Gait Distance (Feet): 75 Feet Assistive device: Rolling walker (2 wheeled) Gait Pattern/deviations: Step-through pattern, Decreased dorsiflexion - left, Decreased stride length, Staggering right, Ataxic General Gait Details: Slow, unsteady gait veering towards the right with the RW and body favoring left to position self outside RW; decreased LLE clearance esp when fatigued. Ataxic throughout. Mutliple standing rest breaks due to weakness/fatigue, difficulty with turns. Gait velocity: decreased    ADL: ADL Overall ADL's : Needs assistance/impaired Eating/Feeding: Modified independent, Sitting Grooming: Wash/dry hands, Wash/dry face, Oral care, Minimal assistance, Standing Upper Body Bathing: Set up, Sitting Lower Body Bathing: Minimal assistance, Sit to/from stand Upper Body Dressing : Set up, Supervision/safety, Sitting Lower Body Dressing: Minimal assistance,  Sit to/from stand, +2 for safety/equipment Toilet Transfer: Minimal assistance, +2 for physical assistance, +2 for safety/equipment, Ambulation, Comfort height toilet, Grab bars, RW Toileting- Clothing Manipulation and Hygiene: Minimal assistance, Sit to/from stand Functional mobility during ADLs: Minimal assistance, +2 for physical assistance, +2 for safety/equipment  Cognition: Cognition Overall Cognitive Status: Within Functional Limits for tasks assessed Orientation Level: Oriented X4 Cognition Arousal/Alertness: Awake/alert Behavior During Therapy: WFL for tasks assessed/performed Overall Cognitive Status: Within Functional Limits for tasks assessed General Comments: WFL for basic info   Blood pressure (!) 107/57, pulse 68, temperature 98.2 F (36.8 C), temperature source Oral, resp. rate 20, height 5\' 10"  (1.778 m), weight 116.1 kg, SpO2 99 %.   General: Alert and oriented x 3, No apparent distress HEENT: Head is normocephalic, atraumatic, PERRLA, EOMI, sclera anicteric, oral mucosa pink and moist, dentition intact, ext ear canals clear,  Neck: Supple without JVD or lymphadenopathy Heart: Reg rate and rhythm. No murmurs rubs or gallops Chest: CTA bilaterally without wheezes, rales, or rhonchi; no distress Abdomen: Soft, non-tender, non-distended, bowel sounds positive. Extremities: No clubbing, cyanosis, or edema. Pulses are 2+ Skin: Clean and intact without signs of breakdown Neuro: Patient is alert in no acute distress oriented x3 and follows commands.  5/5 strength throughout.  Psych: Pt's affect is appropriate. Pt is cooperative   Results for orders placed or performed during the hospital encounter of 07/20/20 (from the past 24 hour(s))  Glucose, capillary     Status: Abnormal   Collection Time: 07/22/20 12:00 PM  Result Value Ref Range   Glucose-Capillary 151 (H) 70 - 99 mg/dL  Glucose, capillary     Status: Abnormal   Collection Time: 07/22/20  4:23 PM  Result Value  Ref Range   Glucose-Capillary 140 (H) 70 - 99 mg/dL  Glucose, capillary     Status: Abnormal   Collection Time: 07/22/20  6:37 PM  Result Value Ref Range   Glucose-Capillary 158 (H) 70 - 99 mg/dL   Comment 1 Document in Chart   Glucose, capillary     Status: Abnormal   Collection Time: 07/22/20  9:23 PM  Result Value Ref Range   Glucose-Capillary 108 (H) 70 - 99 mg/dL   Comment 1 Notify RN    Comment 2 Document in Chart   Glucose, capillary     Status: Abnormal   Collection Time: 07/23/20  6:00 AM  Result Value Ref Range   Glucose-Capillary 142 (H) 70 - 99 mg/dL   Comment 1 Notify RN    Comment 2 Document in Chart    MR CERVICAL SPINE W WO CONTRAST  Result Date: 07/21/2020 CLINICAL DATA:  Myelopathy, acute or progressive. EXAM: MRI CERVICAL AND THORACIC SPINE WITHOUT AND WITH CONTRAST TECHNIQUE: Multiplanar and multiecho pulse sequences of the cervical spine, to include the craniocervical junction and cervicothoracic junction, and the thoracic spine, were obtained without and with intravenous contrast. CONTRAST:  58mL GADAVIST GADOBUTROL 1 MMOL/ML IV SOLN COMPARISON:  07/20/2020 MRI head. FINDINGS: MRI CERVICAL SPINE FINDINGS Alignment: Normal. Vertebrae: Normal bone marrow signal intensity. No focal osseous lesion. No abnormal enhancement. Cord: Normal cord morphology. Multifocal T2 hyperintense cord lesions at the C2-3, C4, C5 and C6-7 level. Small focal enhancement within the anterior cord at the C3 level. No restricted diffusion. Posterior Fossa, vertebral arteries: Please see recent MRI head. Normal flow voids. Disc levels: Disc spaces are preserved. C2-3: Negative C3-4: Negative C4-5: Negative C5-6: Small disc osteophyte complex with superimposed right foraminal and central protrusions. Patent spinal canal and left neural foramen. Mild right neural foraminal narrowing. C6-7: Negative C7-T1: Left predominant uncovertebral and facet degenerative spurring with mild left neural foraminal  narrowing. Patent spinal canal and right neural foramen. Paraspinal tissues: Negative. MRI THORACIC SPINE FINDINGS Alignment:  Normal. Vertebrae: Normal bone marrow signal intensity. No focal osseous lesions. Cord: Normal cord morphology. T2 hyperintense central cord lesion at the T4 level. Smaller posterior midline T2 hyperintensity at the T9-10 level (22:29). No cord enhancement. Paraspinal and other soft tissues: Negative. Disc levels: No significant spinal canal or neural foraminal narrowing. IMPRESSION: Multifocal cervical cord lesions. Focal anterior cord enhancement at the C3 level may reflect active demyelination. Thoracic cord lesions at the T4 and T9-10 level. No abnormal enhancement to suggest active demyelination. Electronically Signed   By: Stana Bunting M.D.   On: 07/21/2020 07:59   MR THORACIC SPINE W WO CONTRAST  Result Date: 07/21/2020 CLINICAL DATA:  Myelopathy, acute or progressive. EXAM: MRI CERVICAL AND THORACIC SPINE WITHOUT AND WITH CONTRAST TECHNIQUE: Multiplanar and multiecho pulse sequences of the cervical spine, to include the craniocervical junction and cervicothoracic junction, and the thoracic spine, were obtained without and with intravenous contrast. CONTRAST:  55mL GADAVIST GADOBUTROL 1 MMOL/ML IV SOLN COMPARISON:  07/20/2020 MRI head. FINDINGS: MRI CERVICAL SPINE FINDINGS Alignment: Normal. Vertebrae: Normal bone marrow signal intensity. No focal osseous lesion. No abnormal enhancement. Cord: Normal cord morphology. Multifocal T2 hyperintense cord lesions at the C2-3, C4, C5 and C6-7 level. Small focal enhancement within the anterior cord at the C3 level.  No restricted diffusion. Posterior Fossa, vertebral arteries: Please see recent MRI head. Normal flow voids. Disc levels: Disc spaces are preserved. C2-3: Negative C3-4: Negative C4-5: Negative C5-6: Small disc osteophyte complex with superimposed right foraminal and central protrusions. Patent spinal canal and left  neural foramen. Mild right neural foraminal narrowing. C6-7: Negative C7-T1: Left predominant uncovertebral and facet degenerative spurring with mild left neural foraminal narrowing. Patent spinal canal and right neural foramen. Paraspinal tissues: Negative. MRI THORACIC SPINE FINDINGS Alignment:  Normal. Vertebrae: Normal bone marrow signal intensity. No focal osseous lesions. Cord: Normal cord morphology. T2 hyperintense central cord lesion at the T4 level. Smaller posterior midline T2 hyperintensity at the T9-10 level (22:29). No cord enhancement. Paraspinal and other soft tissues: Negative. Disc levels: No significant spinal canal or neural foraminal narrowing. IMPRESSION: Multifocal cervical cord lesions. Focal anterior cord enhancement at the C3 level may reflect active demyelination. Thoracic cord lesions at the T4 and T9-10 level. No abnormal enhancement to suggest active demyelination. Electronically Signed   By: Stana Bunting M.D.   On: 07/21/2020 07:59    Assessment/Plan: -Patient would be an excellent CIR candidate but at this time prefers home with outpatient therapy. He does have 24/7 support from his fiance at home. PT note reviewed and he is ambulating MinA x2 75 feet- so may require 2 person assist at home. I will order outpatient follow-up with physiatry to help manage outpatient rehab. If patient changes his mind and would like to reconsider CIR, we would be happy to re-evaluate.   Mcarthur Rossetti Angiulli, PA-C 07/23/2020

## 2020-07-23 NOTE — Progress Notes (Signed)
Patient suffers from multiple sclerosis which impairs their ability to perform daily activities like bathing/ dressing in the home. A walker will not resolve  issue with performing activities of daily living. A wheelchair will allow patient to safely perform daily activities. Patient is not able to propel themselves in the home using a standard weight wheelchair due to weakness. Patient can self propel in the lightweight wheelchair. Length of need lifetime.  Accessories: elevating leg rests (ELRs), wheel locks, extensions and anti-tippers.

## 2020-07-23 NOTE — Progress Notes (Signed)
Physical Therapy Treatment Patient Details Name: Cody Rodriguez MRN: [id number] DOB: 23-Aug-1986 Today's Date: 07/23/2020    History of Present Illness This 34 y.o. male admitted with dizziness, visual changes, and balance difficulties.  MRI of cervical and thoracic spine showed multifocal cervical cord lesions.  Focal anterior cord enhancement at C3 may reflect active demelination.  Thoracic cord lesions T4 and T9-10.  MRI of brasin showed multi focal T2 change in multiple brain areas with single enhancing lesion meeting criteria for MS.  PMH includes: rhabdomyolysis, low back pain, s/p Lt RCR    PT Comments    Today's skilled session focused on mobility progression with less overall assistance needed. Pt with improved balance with RW support this session. Pt and mom both reporting he has plenty of assistance at home and "rides" to get to OPPT/OPOT session at neuro rehab. Discussed equipment, pt has DME at home. May benefit from a manual wheelchair for longer distances/community appt's as he does still fatigue quickly. Reports he has 2 stairs at home, 1 is a platform type step? With no rails. Pt is to have fiance send a picture to his phone for tomorrows session to better plan a strategy for getting him up/down the steps. Acute PT to continue during pt's hospital stay.    Follow Up Recommendations  Outpatient PT;Supervision/Assistance - 24 hour     Equipment Recommendations  Wheelchair (measurements PT);Wheelchair cushion (measurements PT);Other (comment) (if coverded by insurance; has all other needed DME)    Precautions / Restrictions Precautions Precautions: Fall Precaution Comments: ataxia Restrictions Weight Bearing Restrictions: No    Mobility  Bed Mobility Overal bed mobility: Needs Assistance Bed Mobility: Supine to Sit     Supine to sit: Modified independent (Device/Increase time)     General bed mobility comments: HOB ~15 degrees, no rails used today for supine to sit  at edge of bed  Transfers Overall transfer level: Needs assistance Equipment used: Rolling walker (2 wheeled) Transfers: Sit to/from Stand Sit to Stand: Supervision         General transfer comment: x2 reps from edge of bed to RW. cues for hand placement for safety with 1st stand only.  Ambulation/Gait Ambulation/Gait assistance: Min guard;Min assist Gait Distance (Feet): 40 Feet Assistive device: Rolling walker (2 wheeled) Gait Pattern/deviations: Step-through pattern;Decreased dorsiflexion - left;Decreased stride length;Staggering right;Ataxic Gait velocity: decreased   General Gait Details: pt able to stay within RW this session wtih decreased bil step length, left>right. min assist x1 with 90* turn to head back to bed, otherwise min guard assist.       Balance   Sitting-balance support: Feet supported;No upper extremity supported Sitting balance-Leahy Scale: Good     Standing balance support: During functional activity Standing balance-Leahy Scale: Fair Standing balance comment: pt able to stand with supervision/min guard assist wtih RW support for the following: slow alternating marching with holding each rep 3 seconds x 10 rep each leg.             Cognition Arousal/Alertness: Awake/alert Behavior During Therapy: WFL for tasks assessed/performed Overall Cognitive Status: Within Functional Limits for tasks assessed                 Pertinent Vitals/Pain Pain Assessment: No/denies pain     PT Goals (current goals can now be found in the care plan section) Acute Rehab PT Goals Patient Stated Goal: to be able to chase his 34 y.o.  PT Goal Formulation: With patient Time For Goal Achievement: 08/05/20 Potential to  Achieve Goals: Good Progress towards PT goals: Progressing toward goals    Frequency    Min 3X/week      PT Plan Discharge plan needs to be updated;Equipment recommendations need to be updated    AM-PAC PT "6 Clicks" Mobility    Outcome Measure  Help needed turning from your back to your side while in a flat bed without using bedrails?: None Help needed moving from lying on your back to sitting on the side of a flat bed without using bedrails?: None Help needed moving to and from a bed to a chair (including a wheelchair)?: A Little Help needed standing up from a chair using your arms (e.g., wheelchair or bedside chair)?: A Little Help needed to walk in hospital room?: A Little Help needed climbing 3-5 steps with a railing? : A Little 6 Click Score: 20    End of Session Equipment Utilized During Treatment: Gait belt Activity Tolerance: Patient tolerated treatment well Patient left: in bed;with call bell/phone within reach;with family/visitor present (seated edge of bed to talk to mom) Nurse Communication: Mobility status PT Visit Diagnosis: Unsteadiness on feet (R26.81);Ataxic gait (R26.0);Muscle weakness (generalized) (M62.81);Other abnormalities of gait and mobility (R26.89)     Time: 1240-1255 PT Time Calculation (min) (ACUTE ONLY): 15 min  Charges:  $Gait Training: 8-22 mins                    Sallyanne Kuster, PTA, CLT Acute [employer] Office- [phone] 07/23/20, 1:28 PM  Sallyanne Kuster 07/23/2020, 1:24 PM

## 2020-07-23 NOTE — Progress Notes (Signed)
PROGRESS NOTE    Cody Rodriguez  ZOX:[id number] DOB: 1986-06-21 DOA: 07/20/2020 PCP: System, Provider Not In   Brief Narrative:  Cody Rodriguez is a 34 y.o. male with medical history significant of gynecomastia, hypertension, chronic lower back pain, rhabdomyolysis, history of upper extremities and RLE paresthesias since about 4 years presented to ED due to above chief complaint for the past 3 days.  He has had these symptoms for more than 4 years and he was seen by Dr. Lucia Gaskins in January of 2017, but was unable to proceed with work-up due to lack of health insurance.  Since then, he has had them on and off, but the past few days have been more intense.  Patient has been having some trouble with his balance and ambulation.  He also was having a left-sided frontal headache and blurred vision since the day of admission.  No other complaint.  Upon arrival to ED, he was hemodynamically stable. The patient was given a 1000 mL NS bolus, 25 mg of meclizine by the ED providers and 1 g of methylprednisolone was ordered by Dr. Amada Jupiter. Coronavirus PCR was negative.  Labs were within normal range.  He was diagnosed with MS based on the MRI results and was transferred to [facility] for high-dose Solu-Medrol.  Assessment & Plan:   Principal Problem:   Unsteady gait Active Problems:   Thrombocytosis   Elevated serum creatinine   Hyperglycemia   Magnetic resonance imaging of brain abnormal   Hypertension   Multiple sclerosis (HCC)   Possible MS: Per patient, he initially started having some paresthesia and weakness which was displaced from time and location in 2017. For this, he followed a physician and was referred for a brain MRI but he never got to it due to financial reasons. Since then, he has been having issues like that intermittently which will resolve on its own after a week or 2. Never followed up any physician after that. MRI highly suspicious for MS. Neurology consulted and has  started the patient on high-dose Solu-Medrol for total of 5 doses with last dose to be on early morning of 07/26/2020.  Appreciate neurology help and defer management to them.  He was seen by PT OT and they recommended CIR however patient prefers to receive outpatient PT OT.  I have conveyed to [facility] to check if that is an option.  AKI: Resolved.  Thrombocytosis: Improving.  We will repeat labs tomorrow morning.  Hypophosphatemia: Resolved.  Prediabetes: Hemoglobin A1c 5.8 on 07/12/2020.  Continue SSI.  Essential hypertension: Has not taken treatment in a while.  He has been started on amlodipine 5 mg  here.  Blood pressure stable.    DVT prophylaxis:    Code Status: Full Code  Family Communication:  None present at bedside.  Plan of care discussed with patient in length and he verbalized understanding and agreed with it.  Status is: Observation  The patient will require care spanning > 2 midnights and should be moved to inpatient because: Inpatient level of care appropriate due to severity of illness  Dispo: The patient is from: Home              Anticipated d/c is to: Home with outpatient PT or CIR              Anticipated d/c date is: 2 days              Patient currently is not medically stable to d/c.  Estimated body mass index is 36.73 kg/m as calculated from the following:   Height as of this encounter: 5\' 10"  (1.778 m).   Weight as of this encounter: 116.1 kg.      Nutritional status:               Consultants:   Neurology  Procedures:   None  Antimicrobials:  Anti-infectives (From admission, onward)   None         Subjective: Seen and examined.  Strength improving.  No new complaint.  Objective: Vitals:   07/22/20 2016 07/23/20 0028 07/23/20 0332 07/23/20 0723  BP: 140/86 (!) 110/57 (!) 107/57 126/76  Pulse: 67 69 68 85  Resp: 18 18 20 18   Temp: 98.4 F (36.9 C) 97.8 F (36.6 C) 98.2 F (36.8 C) (!) 97.5 F (36.4 C)   TempSrc: Oral Oral Oral Oral  SpO2: 98% 97% 99% 95%  Weight:      Height:       No intake or output data in the 24 hours ending 07/23/20 1036 Filed Weights   07/20/20 1223  Weight: 116.1 kg    Examination:   General exam: Appears calm and comfortable  Respiratory system: Clear to auscultation. Respiratory effort normal. Cardiovascular system: S1 & S2 heard, RRR. No JVD, murmurs, rubs, gallops or clicks. No pedal edema. Gastrointestinal system: Abdomen is nondistended, soft and nontender. No organomegaly or masses felt. Normal bowel sounds heard. Central nervous system: Alert and oriented. No focal neurological deficits. Extremities: Symmetric 5 x 5 power. Skin: No rashes, lesions or ulcers.  Psychiatry: Judgement and insight appear normal. Mood & affect appropriate.   Data Reviewed: I have personally reviewed following labs and imaging studies  CBC: Recent Labs  Lab 07/20/20 1455 07/22/20 0259  WBC 8.4 14.3*  NEUTROABS 5.7 11.8*  HGB 15.5 14.8  HCT 46.4 44.3  MCV 85.8 87.0  PLT 439* 416*   Basic Metabolic Panel: Recent Labs  Lab 07/20/20 1455 07/21/20 0345 07/22/20 0259  NA 138 137 139  K 3.9 4.4 5.1  CL 100 103 104  CO2 27 23 27   GLUCOSE 101* 134* 136*  BUN 14 11 18   CREATININE 1.25* 1.24 1.39*  CALCIUM 9.8 9.7 9.9  MG  --  2.0  --   PHOS  --  2.3* 2.8   GFR: Estimated Creatinine Clearance: 95.5 mL/min (A) (by C-G formula based on SCr of 1.39 mg/dL (H)). Liver Function Tests: Recent Labs  Lab 07/21/20 0345 07/22/20 0259  AST 58* 31  ALT 46* 44  ALKPHOS 65 61  BILITOT 0.9 0.5  PROT 8.0 7.7  ALBUMIN 4.0 3.8   No results for input(s): LIPASE, AMYLASE in the last 168 hours. No results for input(s): AMMONIA in the last 168 hours. Coagulation Profile: No results for input(s): INR, PROTIME in the last 168 hours. Cardiac Enzymes: No results for input(s): CKTOTAL, CKMB, CKMBINDEX, TROPONINI in the last 168 hours. BNP (last 3 results) No results for  input(s): PROBNP in the last 8760 hours. HbA1C: No results for input(s): HGBA1C in the last 72 hours. CBG: Recent Labs  Lab 07/22/20 1200 07/22/20 1623 07/22/20 1837 07/22/20 2123 07/23/20 0600  GLUCAP 151* 140* 158* 108* 142*   Lipid Profile: No results for input(s): CHOL, HDL, LDLCALC, TRIG, CHOLHDL, LDLDIRECT in the last 72 hours. Thyroid Function Tests: No results for input(s): TSH, T4TOTAL, FREET4, T3FREE, THYROIDAB in the last 72 hours. Anemia Panel: No results for input(s): VITAMINB12, FOLATE, FERRITIN, TIBC, IRON, RETICCTPCT in  the last 72 hours. Sepsis Labs: No results for input(s): PROCALCITON, LATICACIDVEN in the last 168 hours.  Recent Results (from the past 240 hour(s))  Respiratory Panel by RT PCR (Flu A&B, Covid) - Nasopharyngeal Swab     Status: None   Collection Time: 07/20/20 10:49 PM   Specimen: Nasopharyngeal Swab  Result Value Ref Range Status   SARS Coronavirus 2 by RT PCR NEGATIVE NEGATIVE Final    Comment: (NOTE) SARS-CoV-2 target nucleic acids are NOT DETECTED.  The SARS-CoV-2 RNA is generally detectable in upper respiratoy specimens during the acute phase of infection. The lowest concentration of SARS-CoV-2 viral copies this assay can detect is 131 copies/mL. A negative result does not preclude SARS-Cov-2 infection and should not be used as the sole basis for treatment or other patient management decisions. A negative result may occur with  improper specimen collection/handling, submission of specimen other than nasopharyngeal swab, presence of viral mutation(s) within the areas targeted by this assay, and inadequate number of viral copies (<131 copies/mL). A negative result must be combined with clinical observations, patient history, and epidemiological information. The expected result is Negative.  Fact Sheet for Patients:  https://www.moore.com/  Fact Sheet for Healthcare Providers:   https://www.young.biz/  This test is no t yet approved or cleared by the [place] FDA and  has been authorized for detection and/or diagnosis of SARS-CoV-2 by FDA under an Emergency Use Authorization (EUA). This EUA will remain  in effect (meaning this test can be used) for the duration of the COVID-19 declaration under Section 564(b)(1) of the Act, 21 U.S.C. section 360bbb-3(b)(1), unless the authorization is terminated or revoked sooner.     Influenza A by PCR NEGATIVE NEGATIVE Final   Influenza B by PCR NEGATIVE NEGATIVE Final    Comment: (NOTE) The Xpert Xpress SARS-CoV-2/FLU/RSV assay is intended as an aid in  the diagnosis of influenza from Nasopharyngeal swab specimens and  should not be used as a sole basis for treatment. Nasal washings and  aspirates are unacceptable for Xpert Xpress SARS-CoV-2/FLU/RSV  testing.  Fact Sheet for Patients: https://www.moore.com/  Fact Sheet for Healthcare Providers: https://www.young.biz/  This test is not yet approved or cleared by the [place] FDA and  has been authorized for detection and/or diagnosis of SARS-CoV-2 by  FDA under an Emergency Use Authorization (EUA). This EUA will remain  in effect (meaning this test can be used) for the duration of the  Covid-19 declaration under Section 564(b)(1) of the Act, 21  U.S.C. section 360bbb-3(b)(1), unless the authorization is  terminated or revoked. Performed at [facility], [address] [place], [place] [address]       Radiology Studies: No results found.  Scheduled Meds: . amLODipine  5 mg Oral Daily  . cholecalciferol  400 Units Oral Daily  . insulin aspart  0-5 Units Subcutaneous QHS  . insulin aspart  0-9 Units Subcutaneous TID WC  . pantoprazole  40 mg Oral Daily   Continuous Infusions: . methylPREDNISolone (SOLU-MEDROL) injection 1,000 mg (07/23/20 0028)     LOS: 2 days   Time spent: 27  minutes   Hughie Closs, MD [facility]  07/23/2020, 10:36 AM   To contact the attending provider between 7A-7P or the covering provider during after hours 7P-7A, please log into the web site [email or web address].

## 2020-07-24 ENCOUNTER — Other Ambulatory Visit (HOSPITAL_COMMUNITY): Payer: Self-pay | Admitting: Family Medicine

## 2020-07-24 DIAGNOSIS — I10 Essential (primary) hypertension: Secondary | ICD-10-CM | POA: Diagnosis not present

## 2020-07-24 LAB — CBC WITH DIFFERENTIAL/PLATELET
Abs Immature Granulocytes: 0.16 10*3/uL — ABNORMAL HIGH (ref 0.00–0.07)
Basophils Absolute: 0 10*3/uL (ref 0.0–0.1)
Basophils Relative: 0 %
Eosinophils Absolute: 0 10*3/uL (ref 0.0–0.5)
Eosinophils Relative: 0 %
HCT: 45.2 % (ref 39.0–52.0)
Hemoglobin: 14.9 g/dL (ref 13.0–17.0)
Immature Granulocytes: 1 %
Lymphocytes Relative: 10 %
Lymphs Abs: 1.4 10*3/uL (ref 0.7–4.0)
MCH: 28 pg (ref 26.0–34.0)
MCHC: 33 g/dL (ref 30.0–36.0)
MCV: 85 fL (ref 80.0–100.0)
Monocytes Absolute: 0.7 10*3/uL (ref 0.1–1.0)
Monocytes Relative: 5 %
Neutro Abs: 11.8 10*3/uL — ABNORMAL HIGH (ref 1.7–7.7)
Neutrophils Relative %: 84 %
Platelets: 443 10*3/uL — ABNORMAL HIGH (ref 150–400)
RBC: 5.32 MIL/uL (ref 4.22–5.81)
RDW: 14.7 % (ref 11.5–15.5)
WBC: 14.1 10*3/uL — ABNORMAL HIGH (ref 4.0–10.5)
nRBC: 0 % (ref 0.0–0.2)

## 2020-07-24 LAB — COMPREHENSIVE METABOLIC PANEL
ALT: 61 U/L — ABNORMAL HIGH (ref 0–44)
AST: 32 U/L (ref 15–41)
Albumin: 3.6 g/dL (ref 3.5–5.0)
Alkaline Phosphatase: 65 U/L (ref 38–126)
Anion gap: 11 (ref 5–15)
BUN: 24 mg/dL — ABNORMAL HIGH (ref 6–20)
CO2: 24 mmol/L (ref 22–32)
Calcium: 9.1 mg/dL (ref 8.9–10.3)
Chloride: 102 mmol/L (ref 98–111)
Creatinine, Ser: 1.21 mg/dL (ref 0.61–1.24)
GFR, Estimated: >60 mL/min (ref >60)
Glucose, Bld: 158 mg/dL — ABNORMAL HIGH (ref 70–99)
Potassium: 4 mmol/L (ref 3.5–5.1)
Sodium: 137 mmol/L (ref 135–145)
Total Bilirubin: 0.6 mg/dL (ref 0.3–1.2)
Total Protein: 7.2 g/dL (ref 6.5–8.1)

## 2020-07-24 LAB — GLUCOSE, CAPILLARY
Glucose-Capillary: 137 mg/dL — ABNORMAL HIGH (ref 70–99)
Glucose-Capillary: 180 mg/dL — ABNORMAL HIGH (ref 70–99)
Glucose-Capillary: 128 mg/dL — ABNORMAL HIGH (ref 70–99)

## 2020-07-24 MED ORDER — AMLODIPINE BESYLATE 5 MG PO TABS
5.0000 mg | ORAL_TABLET | Freq: Every day | ORAL | 0 refills | Status: DC
Start: 2020-07-25 — End: 2020-07-25

## 2020-07-24 NOTE — Progress Notes (Signed)
Physical Therapy Treatment Patient Details Name: Cody Rodriguez MRN: [id number] DOB: 1986-08-14 Today's Date: 07/24/2020    History of Present Illness This 34 y.o. male admitted with dizziness, visual changes, and balance difficulties.  MRI of cervical and thoracic spine showed multifocal cervical cord lesions.  Focal anterior cord enhancement at C3 may reflect active demelination.  Thoracic cord lesions T4 and T9-10.  MRI of brasin showed multi focal T2 change in multiple brain areas with single enhancing lesion meeting criteria for MS.  PMH includes: rhabdomyolysis, low back pain, s/p Lt RCR    PT Comments    Pt hopeful to go home later today. Practiced w/c set up as well as propulsion with UE's only and UE/LE combo. Pt able to manage turning and obstacle navigation.  Pt ambulated 68' with RW and min guard A. LE's fatigued after 50' and pt caught L toe on floor. Pt self corrected with use of RW and took a standing rest before continuing showing good self monitoring. Practiced ascending/descending 2 stairs to simulate entry into home. PT will continue to follow.   Follow Up Recommendations  Outpatient PT;Supervision/Assistance - 24 hour     Equipment Recommendations  Wheelchair (measurements PT);Wheelchair cushion (measurements PT)    Recommendations for Other Services       Precautions / Restrictions Precautions Precautions: Fall Precaution Comments: ataxia Restrictions Weight Bearing Restrictions: No    Mobility  Bed Mobility Overal bed mobility: Modified Independent Bed Mobility: Supine to Sit     Supine to sit: Modified independent (Device/Increase time)     General bed mobility comments: pt able to sit straight up into long sitting from bed flat, no use of rails. Independent to come to EOB  Transfers Overall transfer level: Needs assistance Equipment used: Rolling walker (2 wheeled) Transfers: Sit to/from Stand Sit to Stand: Modified independent (Device/Increase  time)         General transfer comment: no assist needed to stand, reminder of hand placement. No LOB  Ambulation/Gait Ambulation/Gait assistance: Min guard Gait Distance (Feet): 80 Feet Assistive device: Rolling walker (2 wheeled) Gait Pattern/deviations: Step-through pattern;Decreased dorsiflexion - left;Decreased stride length;Staggering right;Ataxic Gait velocity: decreased Gait velocity interpretation: 1.31 - 2.62 ft/sec, indicative of limited community ambulator General Gait Details: pt ambulated 22' without incident and then LE's began to fatigue, caught L toe and chose to stop and regain his positioning, showed good self monitoring with this   Stairs Stairs: Yes Stairs assistance: Supervision Stair Management: With walker;Forwards;Alternating pattern Number of Stairs: 2 General stair comments: pt able to step up with LLE (weaker LE). cues for sequencing for safety vs strength depending on his goals at a given time   [employer] mobility: Yes Wheelchair propulsion: Both upper extremities;Both lower extermities Wheelchair parts: Supervision/cueing Distance: 100 Wheelchair Assistance Details (indicate cue type and reason): practiced propelling with only arms and then a combo of UE's and LE's  Modified Rankin (Stroke Patients Only)       Balance Overall balance assessment: Needs assistance Sitting-balance support: Feet supported;No upper extremity supported Sitting balance-Leahy Scale: Good     Standing balance support: During functional activity Standing balance-Leahy Scale: Fair                              Cognition  Exercises      General Comments General comments (skin integrity, edema, etc.): discussed energy conservation as well as activity level to maintain upon return home.       Pertinent Vitals/Pain Pain Assessment: No/denies pain     Home Living                      Prior Function            PT Goals (current goals can now be found in the care plan section) Acute Rehab PT Goals Patient Stated Goal: to be able to chase his 34 y.o.  PT Goal Formulation: With patient Time For Goal Achievement: 08/05/20 Potential to Achieve Goals: Good Progress towards PT goals: Progressing toward goals    Frequency    Min 3X/week      PT Plan Current plan remains appropriate    Rodriguez-evaluation              AM-PAC PT "6 Clicks" Mobility   Outcome Measure  Help needed turning from your back to your side while in a flat bed without using bedrails?: None Help needed moving from lying on your back to sitting on the side of a flat bed without using bedrails?: None Help needed moving to and from a bed to a chair (including a wheelchair)?: A Little Help needed standing up from a chair using your arms (e.g., wheelchair or bedside chair)?: A Little Help needed to walk in hospital room?: A Little Help needed climbing 3-5 steps with a railing? : A Little 6 Click Score: 20    End of Session Equipment Utilized During Treatment: Gait belt Activity Tolerance: Patient tolerated treatment well Patient left: in bed;with call bell/phone within reach;with family/visitor present (seated edge of bed to talk to mom) Nurse Communication: Mobility status PT Visit Diagnosis: Unsteadiness on feet (R26.81);Ataxic gait (R26.0);Muscle weakness (generalized) (M62.81);Other abnormalities of gait and mobility (R26.89)     Time: 6761-9509 PT Time Calculation (min) (ACUTE ONLY): 35 min  Charges:  $Gait Training: 23-37 mins                     Cody Rodriguez, PT  Acute Rehab Services  Pager [phone] Office [phone]    Cody Rodriguez Cody Rodriguez 07/24/2020, 11:54 AM

## 2020-07-24 NOTE — Discharge Summary (Signed)
Physician Discharge Summary  Cody Rodriguez ZOX:[id number] DOB: 04-Jun-1986 DOA: 07/20/2020  PCP: Lenell Antu, DO  Admit date: 07/20/2020 Discharge date: 07/24/2020  Admitted From: Home Disposition: Home  Recommendations for Outpatient Follow-up:  1. Follow up with PCP in 1-2 weeks 2. Follow with neurology within 1 to 2 weeks to establish care and initiation of drug modifying therapy for MS 3. Please obtain BMP/CBC in one week 4. Please follow up with your PCP on the following pending results: Unresulted Labs (From admission, onward)          Start     Ordered   07/24/20 0807  Comprehensive metabolic panel  Once,   R        07/24/20 0806           Home Health: None Equipment/Devices: Wheelchair  Discharge Condition: Stable CODE STATUS: Full code Diet recommendation: Cardiac  Subjective: Seen and examined.  No complaints.  Weakness improving.  Brief/Interim Summary: Cody Rodriguez a 33 y.o.malewith medical history significant ofgynecomastia, hypertension, chronic lower back pain, rhabdomyolysis, history of upper extremities and RLE paresthesias since about 4 years presented to ED due to dizziness, nausea, headache and weakness in bilateral lower extremities for the past 3 days. He has had these symptoms for more than 4 years and he was seen by Dr. Lucia Gaskins in January of 2017, but was unable to proceed with work-up due to lack of health insurance. Since then, he has had them on and off, but the past few days have been more intense. Patient has been having some trouble with his balance and ambulation. He also was having a left-sided frontal headache and blurred vision since the day of admission.  No other complaint.  Upon arrival to ED, he was hemodynamically stable.The patient was given a 1000 mL NS bolus, 25 mg of meclizine by the ED providers and 1 g of methylprednisolone was ordered by Dr. Kirkpatrick/neurologist. Coronavirus PCR was negative.  Labs were within normal range.   He was diagnosed with MS. Meets the criteria for separation in time and space by both MRI and clinical criteria consistent with multiple sclerosis.  He was started on high-dose Solu-Medrol 1000 mg IV daily for 5 doses.  Patient was seen by PT OT.  His weakness is improving.  PT OT recommended CIR however patient preferred to go home and do outpatient PT.  Patient will receive his fifth dose of IV Solu-Medrol today and will discharge home after that with instructions to follow-up outpatient with PT as well as neurologist to establish care and start on disease modifying therapy for MS.   Discharge Diagnoses:  Principal Problem:   Unsteady gait Active Problems:   Thrombocytosis   Elevated serum creatinine   Hyperglycemia   Magnetic resonance imaging of brain abnormal   Hypertension   Multiple sclerosis Cody Rodriguez)    Discharge Instructions  Discharge Instructions    Ambulatory referral to Occupational Therapy   Complete by: As directed    Ambulatory referral to Physical Medicine Rehab   Complete by: As directed    Ambulatory referral to Physical Therapy   Complete by: As directed    Ambulatory referral to Physical Therapy   Complete by: As directed      Allergies as of 07/24/2020   No Known Allergies     Medication List    TAKE these medications   acetaminophen 325 MG tablet Commonly known as: TYLENOL Take 650 mg by mouth every 6 (six) hours as needed for mild pain, fever  or headache.   amLODipine 5 MG tablet Commonly known as: NORVASC Take 1 tablet (5 mg total) by mouth daily. Start taking on: July 25, 2020   meloxicam 15 MG tablet Commonly known as: MOBIC Take 15 mg by mouth daily as needed for pain.   multivitamin capsule Take 1 capsule by mouth daily.   naproxen 500 MG tablet Commonly known as: NAPROSYN Take 500 mg by mouth daily as needed for mild pain or headache.            Durable Medical Equipment  (From admission, onward)         Start      Ordered   07/23/20 1326  For home use only DME lightweight manual wheelchair with seat cushion  Once       Comments: Patient suffers from multiple sclerosis which impairs their ability to perform daily activities like bathing/ dressing in the home.  A walker will not resolve  issue with performing activities of daily living. A wheelchair will allow patient to safely perform daily activities. Patient is not able to propel themselves in the home using a standard weight wheelchair due to weakness. Patient can self propel in the lightweight wheelchair. Length of need lifetime. Accessories: elevating leg rests (ELRs), wheel locks, extensions and anti-tippers.   07/23/20 1326          Follow-up Information    [facility].   Specialty: Rehabilitation Why: The outpatient therapy will contact you for the first home visit. Contact information: [address] [id number] mc [place] [address] [phone]       Hamilton Capri P, DO Follow up in 1 week(s).   Specialty: Family Medicine Contact information: [address] [place] [address] [phone]        Asa Lente, MD Follow up in 1 week(s).   Specialty: Neurology Contact information: [address] [place] [address] [phone]              No Known Allergies  Consultations: Neurology   Procedures/Studies: CT Head Wo Contrast  Result Date: 07/20/2020 CLINICAL DATA:  Dizziness EXAM: CT HEAD WITHOUT CONTRAST TECHNIQUE: Contiguous axial images were obtained from the base of the skull through the vertex without intravenous contrast. COMPARISON:  05/12/2017 head CT. FINDINGS: Brain: No intracranial hemorrhage. No mass lesion. No midline shift, ventriculomegaly or extra-axial fluid collection. Cerebral volume is within normal limits. Bilateral periatrial white matter hypodensities. Vascular: No hyperdense vessel or unexpected calcification.  Skull: Negative for fracture or focal lesion. Sinuses/Orbits: Normal orbits. Clear paranasal sinuses. No mastoid effusion. Other: None. IMPRESSION: Nonspecific bilateral periatrial white matter hypodensities. Differential includes demyelination, age indeterminate insults, post infectious/inflammatory sequela and vasculopathy. MRI brain with and without contrast is recommended. Electronically Signed   By: Stana Bunting M.D.   On: 07/20/2020 14:47   MR Brain W and Wo Contrast  Result Date: 07/20/2020 CLINICAL DATA:  34 year old male with new dizziness. Abnormal appearance of the cerebral white matter on head CT earlier today. EXAM: MRI HEAD WITHOUT AND WITH CONTRAST TECHNIQUE: Multiplanar, multiecho pulse sequences of the brain and surrounding structures were obtained without and with intravenous contrast. CONTRAST:  69mL GADAVIST GADOBUTROL 1 MMOL/ML IV SOLN COMPARISON:  Head CT 1439 hours. FINDINGS: Brain: Patchy and confluent abnormal T2 and FLAIR hyperintensity in the bilateral periventricular white matter. Temporal lobe involvement greater on the left. Corpus callosum volume loss is associated. White Matter lesions in the right corona radiata and  centrum semiovale demonstrate patchy diffusion restriction (series 5, image 83) and indistinct petechial and leading edge type enhancement (series 19 images 32 through 34). No other diffusion restricted or enhancing lesions, but there is also advanced involvement of the right cerebellar peduncle and adjacent brainstem (series 11, image 6 and series 9, image 6). No cortical involvement identified. The corona radiata involvement does track toward the posterior limbs of the internal capsules on both sides. The deep gray nuclei appear spared. No superimposed restricted diffusion suggestive of acute infarction. No midline shift, mass effect, evidence of mass lesion, ventriculomegaly, extra-axial collection or acute intracranial hemorrhage. No chronic cerebral blood  products. Cervicomedullary junction and pituitary are within normal limits. No other abnormal enhancement. No dural thickening identified. Vascular: Major intracranial vascular flow voids are preserved. The major dural venous sinuses are enhancing and appear to be patent. Skull and upper cervical spine: Grossly normal visible cervical spine, spinal cord. Visualized bone marrow signal is within normal limits. Sinuses/Orbits: Mildly Disconjugate gaze but otherwise negative orbits. Paranasal sinuses and mastoids are stable and well pneumatized. Other: Visible internal auditory structures appear normal. Scalp and face appear negative. 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 Active demyelination in the right hemisphere white matter. Conspicuous chronic involvement of the right brainstem and cerebellar peduncle. Electronically Signed   By: Odessa Fleming M.D.   On: 07/20/2020 21:18   MR CERVICAL SPINE W WO CONTRAST  Result Date: 07/21/2020 CLINICAL DATA:  Myelopathy, acute or progressive. EXAM: MRI CERVICAL AND THORACIC SPINE WITHOUT AND WITH CONTRAST TECHNIQUE: Multiplanar and multiecho pulse sequences of the cervical spine, to include the craniocervical junction and cervicothoracic junction, and the thoracic spine, were obtained without and with intravenous contrast. CONTRAST:  39mL GADAVIST GADOBUTROL 1 MMOL/ML IV SOLN COMPARISON:  07/20/2020 MRI head. FINDINGS: MRI CERVICAL SPINE FINDINGS Alignment: Normal. Vertebrae: Normal bone marrow signal intensity. No focal osseous lesion. No abnormal enhancement. Cord: Normal cord morphology. Multifocal T2 hyperintense cord lesions at the C2-3, C4, C5 and C6-7 level. Small focal enhancement within the anterior cord at the C3 level. No restricted diffusion. Posterior Fossa, vertebral arteries: Please see recent MRI head. Normal flow voids. Disc levels: Disc spaces are preserved. C2-3: Negative C3-4:  Negative C4-5: Negative C5-6: Small disc osteophyte complex with superimposed right foraminal and central protrusions. Patent spinal canal and left neural foramen. Mild right neural foraminal narrowing. C6-7: Negative C7-T1: Left predominant uncovertebral and facet degenerative spurring with mild left neural foraminal narrowing. Patent spinal canal and right neural foramen. Paraspinal tissues: Negative. MRI THORACIC SPINE FINDINGS Alignment:  Normal. Vertebrae: Normal bone marrow signal intensity. No focal osseous lesions. Cord: Normal cord morphology. T2 hyperintense central cord lesion at the T4 level. Smaller posterior midline T2 hyperintensity at the T9-10 level (22:29). No cord enhancement. Paraspinal and other soft tissues: Negative. Disc levels: No significant spinal canal or neural foraminal narrowing. IMPRESSION: Multifocal cervical cord lesions. Focal anterior cord enhancement at the C3 level may reflect active demyelination. Thoracic cord lesions at the T4 and T9-10 level. No abnormal enhancement to suggest active demyelination. Electronically Signed   By: Stana Bunting M.D.   On: 07/21/2020 07:59   MR THORACIC SPINE W WO CONTRAST  Result Date: 07/21/2020 CLINICAL DATA:  Myelopathy, acute or progressive. EXAM: MRI CERVICAL AND THORACIC SPINE WITHOUT AND WITH CONTRAST TECHNIQUE: Multiplanar and multiecho pulse sequences of the cervical spine, to include the craniocervical junction and cervicothoracic junction, and the thoracic spine, were obtained  without and with intravenous contrast. CONTRAST:  10mL GADAVIST GADOBUTROL 1 MMOL/ML IV SOLN COMPARISON:  07/20/2020 MRI head. FINDINGS: MRI CERVICAL SPINE FINDINGS Alignment: Normal. Vertebrae: Normal bone marrow signal intensity. No focal osseous lesion. No abnormal enhancement. Cord: Normal cord morphology. Multifocal T2 hyperintense cord lesions at the C2-3, C4, C5 and C6-7 level. Small focal enhancement within the anterior cord at the C3 level. No  restricted diffusion. Posterior Fossa, vertebral arteries: Please see recent MRI head. Normal flow voids. Disc levels: Disc spaces are preserved. C2-3: Negative C3-4: Negative C4-5: Negative C5-6: Small disc osteophyte complex with superimposed right foraminal and central protrusions. Patent spinal canal and left neural foramen. Mild right neural foraminal narrowing. C6-7: Negative C7-T1: Left predominant uncovertebral and facet degenerative spurring with mild left neural foraminal narrowing. Patent spinal canal and right neural foramen. Paraspinal tissues: Negative. MRI THORACIC SPINE FINDINGS Alignment:  Normal. Vertebrae: Normal bone marrow signal intensity. No focal osseous lesions. Cord: Normal cord morphology. T2 hyperintense central cord lesion at the T4 level. Smaller posterior midline T2 hyperintensity at the T9-10 level (22:29). No cord enhancement. Paraspinal and other soft tissues: Negative. Disc levels: No significant spinal canal or neural foraminal narrowing. IMPRESSION: Multifocal cervical cord lesions. Focal anterior cord enhancement at the C3 level may reflect active demyelination. Thoracic cord lesions at the T4 and T9-10 level. No abnormal enhancement to suggest active demyelination. Electronically Signed   By: Stana Bunting M.D.   On: 07/21/2020 07:59      Discharge Exam: Vitals:   07/24/20 0739 07/24/20 0959  BP: (!) 144/82 137/90  Pulse: 92 77  Resp: 20   Temp: 98.2 F (36.8 C)   SpO2: 99%    Vitals:   07/23/20 2345 07/24/20 0402 07/24/20 0739 07/24/20 0959  BP: 123/67 134/72 (!) 144/82 137/90  Pulse: 72 61 92 77  Resp: Temp: 98 F (36.7 C) 98.4 F (36.9 C) 98.2 F (36.8 C)   TempSrc: Oral Oral Oral   SpO2: 100% 97% 99%   Weight:      Height:        General: Pt is alert, awake, not in acute distress Cardiovascular: RRR, S1/S2 +, no rubs, no gallops Respiratory: CTA bilaterally, no wheezing, no rhonchi Abdominal: Soft, NT, ND, bowel sounds  + Extremities: no edema, no cyanosis    The results of significant diagnostics from this hospitalization (including imaging, microbiology, ancillary and laboratory) are listed below for reference.     Microbiology: Recent Results (from the past 240 hour(s))  Respiratory Panel by RT PCR (Flu A&B, Covid) - Nasopharyngeal Swab     Status: None   Collection Time: 07/20/20 10:49 PM   Specimen: Nasopharyngeal Swab  Result Value Ref Range Status   SARS Coronavirus 2 by RT PCR NEGATIVE NEGATIVE Final    Comment: (NOTE) SARS-CoV-2 target nucleic acids are NOT DETECTED.  The SARS-CoV-2 RNA is generally detectable in upper respiratoy specimens during the acute phase of infection. The lowest concentration of SARS-CoV-2 viral copies this assay can detect is 131 copies/mL. A negative result does not preclude SARS-Cov-2 infection and should not be used as the sole basis for treatment or other patient management decisions. A negative result may occur with  improper specimen collection/handling, submission of specimen other than nasopharyngeal swab, presence of viral mutation(s) within the areas targeted by this assay, and inadequate number of viral copies (<131 copies/mL). A negative result must be combined with clinical observations, patient history, and epidemiological information. The expected  result is Negative.  Fact Sheet for Patients:  https://www.moore.com/  Fact Sheet for Healthcare Providers:  https://www.young.biz/  This test is no t yet approved or cleared by the [place] FDA and  has been authorized for detection and/or diagnosis of SARS-CoV-2 by FDA under an Emergency Use Authorization (EUA). This EUA will remain  in effect (meaning this test can be used) for the duration of the COVID-19 declaration under Section 564(b)(1) of the Act, 21 U.S.C. section 360bbb-3(b)(1), unless the authorization is terminated or revoked sooner.      Influenza A by PCR NEGATIVE NEGATIVE Final   Influenza B by PCR NEGATIVE NEGATIVE Final    Comment: (NOTE) The Xpert Xpress SARS-CoV-2/FLU/RSV assay is intended as an aid in  the diagnosis of influenza from Nasopharyngeal swab specimens and  should not be used as a sole basis for treatment. Nasal washings and  aspirates are unacceptable for Xpert Xpress SARS-CoV-2/FLU/RSV  testing.  Fact Sheet for Patients: https://www.moore.com/  Fact Sheet for Healthcare Providers: https://www.young.biz/  This test is not yet approved or cleared by the [place] FDA and  has been authorized for detection and/or diagnosis of SARS-CoV-2 by  FDA under an Emergency Use Authorization (EUA). This EUA will remain  in effect (meaning this test can be used) for the duration of the  Covid-19 declaration under Section 564(b)(1) of the Act, 21  U.S.C. section 360bbb-3(b)(1), unless the authorization is  terminated or revoked. Performed at [facility], [address] [place], [place] [address]      Labs: BNP (last 3 results) No results for input(s): BNP in the last 8760 hours. Basic Metabolic Panel: Recent Labs  Lab 07/20/20 1455 07/21/20 0345 07/22/20 0259  NA 138 137 139  K 3.9 4.4 5.1  CL 100 103 104  CO2 GLUCOSE 101* 134* 136*  BUN CREATININE 1.25* 1.24 1.39*  CALCIUM 9.8 9.7 9.9  MG  --  2.0  --   PHOS  --  2.3* 2.8   Liver Function Tests: Recent Labs  Lab 07/21/20 0345 07/22/20 0259  AST 58* 31  ALT 46* 44  ALKPHOS 65 61  BILITOT 0.9 0.5  PROT 8.0 7.7  ALBUMIN 4.0 3.8   No results for input(s): LIPASE, AMYLASE in the last 168 hours. No results for input(s): AMMONIA in the last 168 hours. CBC: Recent Labs  Lab 07/20/20 1455 07/22/20 0259 07/24/20 1023  WBC 8.4 14.3* 14.1*  NEUTROABS 5.7 11.8* 11.8*  HGB 15.5 14.8 14.9  HCT 46.4 44.3 45.2  MCV 85.8 87.0 85.0  PLT 439* 416* 443*   Cardiac  Enzymes: No results for input(s): CKTOTAL, CKMB, CKMBINDEX, TROPONINI in the last 168 hours. BNP: Invalid input(s): POCBNP CBG: Recent Labs  Lab 07/23/20 0600 07/23/20 1150 07/23/20 1626 07/23/20 2117 07/24/20 0606  GLUCAP 142* 243* 161* 103* 137*   D-Dimer No results for input(s): DDIMER in the last 72 hours. Hgb A1c No results for input(s): HGBA1C in the last 72 hours. Lipid Profile No results for input(s): CHOL, HDL, LDLCALC, TRIG, CHOLHDL, LDLDIRECT in the last 72 hours. Thyroid function studies No results for input(s): TSH, T4TOTAL, T3FREE, THYROIDAB in the last 72 hours.  Invalid input(s): FREET3 Anemia work up No results for input(s): VITAMINB12, FOLATE, FERRITIN, TIBC, IRON, RETICCTPCT in the last 72 hours. Urinalysis    Component Value Date/Time   COLORURINE BROWN (A) 11/24/2019 1802   APPEARANCEUR HAZY (A) 11/24/2019 1802   LABSPEC >1.030 (  H) 11/24/2019 1802   PHURINE 6.0 11/24/2019 1802   GLUCOSEU NEGATIVE 11/24/2019 1802   HGBUR LARGE (A) 11/24/2019 1802   BILIRUBINUR SMALL (A) 11/24/2019 1802   KETONESUR NEGATIVE 11/24/2019 1802   PROTEINUR >300 (A) 11/24/2019 1802   NITRITE NEGATIVE 11/24/2019 1802   LEUKOCYTESUR NEGATIVE 11/24/2019 1802   Sepsis Labs Invalid input(s): PROCALCITONIN,  WBC,  LACTICIDVEN Microbiology Recent Results (from the past 240 hour(s))  Respiratory Panel by RT PCR (Flu A&B, Covid) - Nasopharyngeal Swab     Status: None   Collection Time: 07/20/20 10:49 PM   Specimen: Nasopharyngeal Swab  Result Value Ref Range Status   SARS Coronavirus 2 by RT PCR NEGATIVE NEGATIVE Final    Comment: (NOTE) SARS-CoV-2 target nucleic acids are NOT DETECTED.  The SARS-CoV-2 RNA is generally detectable in upper respiratoy specimens during the acute phase of infection. The lowest concentration of SARS-CoV-2 viral copies this assay can detect is 131 copies/mL. A negative result does not preclude SARS-Cov-2 infection and should not be used as the  sole basis for treatment or other patient management decisions. A negative result may occur with  improper specimen collection/handling, submission of specimen other than nasopharyngeal swab, presence of viral mutation(s) within the areas targeted by this assay, and inadequate number of viral copies (<131 copies/mL). A negative result must be combined with clinical observations, patient history, and epidemiological information. The expected result is Negative.  Fact Sheet for Patients:  https://www.moore.com/  Fact Sheet for Healthcare Providers:  https://www.young.biz/  This test is no t yet approved or cleared by the [place] FDA and  has been authorized for detection and/or diagnosis of SARS-CoV-2 by FDA under an Emergency Use Authorization (EUA). This EUA will remain  in effect (meaning this test can be used) for the duration of the COVID-19 declaration under Section 564(b)(1) of the Act, 21 U.S.C. section 360bbb-3(b)(1), unless the authorization is terminated or revoked sooner.     Influenza A by PCR NEGATIVE NEGATIVE Final   Influenza B by PCR NEGATIVE NEGATIVE Final    Comment: (NOTE) The Xpert Xpress SARS-CoV-2/FLU/RSV assay is intended as an aid in  the diagnosis of influenza from Nasopharyngeal swab specimens and  should not be used as a sole basis for treatment. Nasal washings and  aspirates are unacceptable for Xpert Xpress SARS-CoV-2/FLU/RSV  testing.  Fact Sheet for Patients: https://www.moore.com/  Fact Sheet for Healthcare Providers: https://www.young.biz/  This test is not yet approved or cleared by the [place] FDA and  has been authorized for detection and/or diagnosis of SARS-CoV-2 by  FDA under an Emergency Use Authorization (EUA). This EUA will remain  in effect (meaning this test can be used) for the duration of the  Covid-19 declaration under Section 564(b)(1) of  the Act, 21  U.S.C. section 360bbb-3(b)(1), unless the authorization is  terminated or revoked. Performed at [facility], [address] [place], [place] [address]      Time coordinating discharge: Over 30 minutes  SIGNED:   Hughie Closs, MD  [facility] 07/24/2020, 11:38 AM  If 7PM-7AM, please contact night-coverage [email or web address]

## 2020-07-24 NOTE — Discharge Instructions (Signed)
Multiple Sclerosis Multiple sclerosis (MS) is a disease of the brain, spinal cord, and optic nerves (central nervous system). It causes the body's disease-fighting (immune) system to destroy the protective covering (myelin sheath) around nerves in the brain. When this happens, signals (nerve impulses) going to and from the brain and spinal cord do not get sent properly or may not get sent at all. There are several types of MS:  Relapsing-remitting MS. This is the most common type. This causes sudden attacks of symptoms. After an attack, you may recover completely until the next attack, or some symptoms may remain permanently.  Secondary progressive MS. This usually develops after the onset of relapsing-remitting MS. Similar to relapsing-remitting MS, this type also causes sudden attacks of symptoms. Attacks may be less frequent, but symptoms slowly get worse (progress) over time.  Primary progressive MS. This causes symptoms that steadily progress over time. This type of MS does not cause sudden attacks of symptoms. The age of onset of MS varies, but it often develops between 69-46 years of age. MS is a lifelong (chronic) condition. There is no cure, but treatment can help slow down the progression of the disease. What are the causes? The cause of this condition is not known. What increases the risk? You are more likely to develop this condition if:  You are a woman.  You have a relative with MS. However, the condition is not passed from parent to child (inherited).  You have a lack (deficiency) of vitamin D.  You smoke. MS is more common in the [place] than in the [place]. What are the signs or symptoms? Relapsing-remitting and secondary progressive MS cause symptoms to occur in episodes or attacks that may last weeks to months. There may be long periods between attacks in which there are almost no symptoms. Primary progressive MS causes symptoms to steadily  progress after they develop. Symptoms of MS vary because of the many different ways it affects the central nervous system. The main symptoms include:  Vision problems and eye pain.  Numbness.  Weakness.  Inability to move your arms, hands, feet, or legs (paralysis).  Balance problems.  Shaking that you cannot control (tremors).  Muscle spasms.  Problems with thinking (cognitive changes). MS can also cause symptoms that are associated with the disease, but are not always the direct result of an MS attack. They may include:  Inability to control urination or bowel movements (incontinence).  Headaches.  Fatigue.  Inability to tolerate heat.  Emotional changes.  Depression.  Pain. How is this diagnosed? This condition is diagnosed based on:  Your symptoms.  A neurological exam. This involves checking central nervous system function, such as nerve function, reflexes, and coordination.  MRIs of the brain and spinal cord.  Lab tests, including a lumbar puncture that tests the fluid that surrounds the brain and spinal cord (cerebrospinal fluid).  Tests to measure the electrical activity of the brain in response to stimulation (evoked potentials). How is this treated? There is no cure for MS, but medicines can help decrease the number and frequency of attacks and help relieve nuisance symptoms. Treatment options may include:  Medicines that reduce the frequency of attacks. These medicines may be given by injection, by mouth (orally), or through an IV.  Medicines that reduce inflammation (steroids). These may provide short-term relief of symptoms.  Medicines to help control pain, depression, fatigue, or incontinence.  Vitamin D, if you have a deficiency.  Using devices to  help you move around (assistive devices), such as braces, a cane, or a walker.  Physical therapy to strengthen and stretch your muscles.  Occupational therapy to help you with everyday  tasks.  Alternative or complementary treatments such as exercise, massage, or acupuncture. Follow these instructions at home:  Take over-the-counter and prescription medicines only as told by your health care provider.  Do not drive or use heavy machinery while taking prescription pain medicine.  Use assistive devices as recommended by your physical therapist or your health care provider.  Exercise as directed by your health care provider.  Return to your normal activities as told by your health care provider. Ask your health care provider what activities are safe for you.  Reach out for support. Share your feelings with friends, family, or a support group.  Keep all follow-up visits as told by your health care provider and therapists. This is important. Where to find more information  National Multiple Sclerosis Society: https://www.nationalmssociety.org Contact a health care provider if:  You feel depressed.  You develop new pain or numbness.  You have tremors.  You have problems with sexual function. Get help right away if:  You develop paralysis.  You develop numbness.  You have problems with your bladder or bowel function.  You develop double vision.  You lose vision in one or both eyes.  You develop suicidal thoughts.  You develop severe confusion. If you ever feel like you may hurt yourself or others, or have thoughts about taking your own life, get help right away. You can go to your nearest emergency department or call:  Your local emergency services (911 in the [place]  A suicide crisis helpline, such as the National Suicide Prevention Lifeline at 901-066-1907. This is open 24 hours a day. Summary  Multiple sclerosis (MS) is a disease of the central nervous system that causes the body's immune system to destroy the protective covering (myelin sheath) around nerves in the brain.  There are 3 types of MS: relapsing-remitting, secondary progressive, and  primary progressive. Relapsing-remitting and secondary progressive MS cause symptoms to occur in episodes or attacks that may last weeks to months. Primary progressive MS causes symptoms to steadily progress after they develop.  There is no cure for MS, but medicines can help decrease the number and frequency of attacks and help relieve nuisance symptoms. Treatment may also include physical or occupational therapy.  If you develop numbness, paralysis, vision problems, or other neurological symptoms, get help right away. This information is not intended to replace advice given to you by your health care provider. Make sure you discuss any questions you have with your health care provider. Document Revised: 09/04/2017 Document Reviewed: 12/01/2016 Elsevier Patient Education  2020 [employer]. PLEASE GO DIRECTLY TO [facility] EMERGENCY DEPARTMENT FOR MRI OF YOUR BRAIN.

## 2020-07-24 NOTE — TOC Transition Note (Signed)
Transition of Care [facility]) - CM/SW Discharge Note   Patient Details  Name: Ahmad Vanwey MRN: [id number] Date of Birth: September 19, 1986  Transition of Care [facility] CM/SW Contact:  Kermit Balo, RN Phone Number: 07/24/2020, 11:16 AM   Clinical Narrative:    Pt will d/c home tonight after last dose of IV solumedrol. Wife is at the bedside and can provide needed supervision and transportation. Wheelchair for home is at bedside.   Final next level of care: OP Rehab Barriers to Discharge: No Barriers Identified   Patient Goals and CMS Choice   CMS Medicare.gov Compare Post Acute Care list provided to:: Patient Choice offered to / list presented to : Patient, Spouse  Discharge Placement                       Discharge Plan and Services   Discharge Planning Services: CM Consult            DME Arranged: Lightweight manual wheelchair with seat cushion DME Agency: [employer] Date DME Agency Contacted: 07/23/20   Representative spoke with at DME Agency: [employer]            Social Determinants of Health (SDOH) Interventions     Readmission Risk Interventions No flowsheet data found.

## 2020-07-26 ENCOUNTER — Other Ambulatory Visit: Payer: Self-pay

## 2020-07-26 ENCOUNTER — Ambulatory Visit (INDEPENDENT_AMBULATORY_CARE_PROVIDER_SITE_OTHER): Payer: 59 | Admitting: Family Medicine

## 2020-07-26 ENCOUNTER — Encounter (INDEPENDENT_AMBULATORY_CARE_PROVIDER_SITE_OTHER): Payer: Self-pay | Admitting: Family Medicine

## 2020-07-26 VITALS — BP 117/74 | HR 109 | Temp 98.1°F | Ht 70.0 in | Wt 245.0 lb

## 2020-07-26 DIAGNOSIS — E559 Vitamin D deficiency, unspecified: Secondary | ICD-10-CM | POA: Diagnosis not present

## 2020-07-26 DIAGNOSIS — E7849 Other hyperlipidemia: Secondary | ICD-10-CM | POA: Diagnosis not present

## 2020-07-26 DIAGNOSIS — Z9189 Other specified personal risk factors, not elsewhere classified: Secondary | ICD-10-CM | POA: Diagnosis not present

## 2020-07-26 DIAGNOSIS — I10 Essential (primary) hypertension: Secondary | ICD-10-CM | POA: Diagnosis not present

## 2020-07-26 DIAGNOSIS — E66812 Obesity, class 2: Secondary | ICD-10-CM

## 2020-07-26 DIAGNOSIS — G35 Multiple sclerosis: Secondary | ICD-10-CM | POA: Diagnosis not present

## 2020-07-26 DIAGNOSIS — R7303 Prediabetes: Secondary | ICD-10-CM

## 2020-07-26 DIAGNOSIS — Z6835 Body mass index (BMI) 35.0-35.9, adult: Secondary | ICD-10-CM

## 2020-07-26 MED ORDER — VITAMIN D (ERGOCALCIFEROL) 1.25 MG (50000 UNIT) PO CAPS: 50000.0000 [IU] | ORAL_CAPSULE | ORAL | 0 refills | Status: DC

## 2020-08-01 NOTE — Progress Notes (Signed)
Chief Complaint:   OBESITY Cody Rodriguez is here to discuss his progress with his obesity treatment plan along with follow-up of his obesity related diagnoses. Cody Rodriguez is on the Category 3 Plan and states he is following his eating plan approximately 90% of the time. Cody Rodriguez states he is exercising for 0 minutes 0 times per week.  Today's visit was #: 2 Starting weight: 255 lbs Starting date: 07/12/2020 Today's weight: 245 lbs Today's date: 07/26/2020 Total lbs lost to date: 10 lbs Total lbs lost since last in-office visit: 10 lbs Total weight loss percentage to date: -3.92%  Interim History: Cody Rodriguez was recently diagnosed with MS via Dr. Lucia Gaskins in Neurology.  He says the meal plan went very well.  Easy to follow.  He says the food amounts were :just right".  He has had good energy, feeling well on plan.  Hunger is controlled.  Denies cravings.  No soda - all water.  He does not like the [place] yogurt and is wondering about alternatives.  Assessment/Plan:   1. Multiple sclerosis (HCC) Discussed labs with patient today.  Diagnosed by Dr. Lucia Gaskins recently when he had acute flare of dizziness/weakness.  MRI was conclusive for diagnosis.    Plan:  Follow-up with Dr. Lucia Gaskins and treatment plan per Neurology.  Continue prudent nutritional plan.  2. Essential hypertension Discussed labs with patient today.  Cody Rodriguez was started on 5 mg Norvasc recently in  The hospital.  Tolerating well and no low blood pressures.  No home blood pressure monitoring.  Plan:  Blood pressure is at goal.  Continue medication at current dose.  Follow-up with PCP as well for post hospital visit.  BP Readings from Last 3 Encounters:  07/26/20 117/74  07/24/20 (!) 148/94  07/12/20 135/73   3. Other hyperlipidemia Discussed labs with patient today.  Cody Rodriguez has hyperlipidemia and has been trying to improve his cholesterol levels with intensive lifestyle modification including a low saturated fat diet, exercise and weight  loss. He denies any chest pain, claudication or myalgias.  No first degree relatives with early CAD.  He is on no medication for this.  Plan:  Continue reducing sat/trans fats in diet through prudent nutritional plan.  Continue weight loss.  Lab Results  Component Value Date   ALT 61 (H) 07/24/2020   AST 32 07/24/2020   ALKPHOS 65 07/24/2020   BILITOT 0.6 07/24/2020   Lab Results  Component Value Date   CHOL 203 (H) 07/12/2020   HDL 38 (L) 07/12/2020   LDLCALC 146 (H) 07/12/2020   TRIG 107 07/12/2020   4. Vitamin D deficiency New.  Discussed labs with patient today.  Cody Rodriguez has a history of Vitamin D deficiency with resultant generalized fatigue as his primary symptom.  he is taking no vitamin D supplement for this deficiency and tolerating it well without side-effect.  Most recent Vitamin D lab reviewed-  level: 11.6 on 07/12/2020.  Plan:   - Discussed importance of vitamin D (as well as calcium) to their health and wellbeing.   - We reviewed possible symptoms of low Vitamin D including low energy, depressed mood, muscle aches, joint aches, osteoporosis, etc.  - We discussed that low Vitamin D levels may be linked to an increased risk of cardiovascular events, and even increased risk of cancers, such as colon and breast.   - Educated pt that weight loss will likely improve availability of vitamin D, thus encouraged Adalid to continue with meal plan and their weight loss efforts to  further improve this condition.  - I recommend pt start to take weekly prescription vit D 50,000 IU - see script below- which pt agrees to after discussion of the risks and benefits of this medication.      - Informed patient this may be a lifelong thing, and he was encouraged to continue to take the medicine until told otherwise.  We will need to monitor levels regularly (every 3-4 mo on average) to keep levels within normal limits.   - All pt's questions and concerns regarding this condition  addressed.  -Refill Vitamin D, Ergocalciferol, (DRISDOL) 1.25 MG (50000 UNIT) CAPS capsule; Take 1 capsule (50,000 Units total) by mouth every 7 (seven) days.  Dispense: 4 capsule; Refill: 0  5. Prediabetes New.  Discussed labs with patient today.  Cody Rodriguez has a diagnosis of prediabetes based on his elevated HgA1c and was informed this puts him at greater risk of developing diabetes. He continues to work on diet and exercise to decrease his risk of diabetes. He denies nausea or hypoglycemia.  Mother has diabetes and sister has prediabetes.  Plan:  Handouts given after extensive counseling was done regarding diagnosis.  Lab Results  Component Value Date   HGBA1C 5.8 (H) 07/12/2020   Lab Results  Component Value Date   INSULIN 26.1 (H) 07/12/2020   6. At risk for activity intolerance Cody Rodriguez was given approximately 25 minutes of exercise intolerance counseling today. He is 34 y.o. male and has risk factors exercise intolerance including MS flares and recent diagnosis. We discussed intensive lifestyle modifications today with an emphasis on specific weight loss instructions and strategies. Cody Rodriguez will slowly increase activity as tolerated.  7. Class 2 severe obesity with serious comorbidity and body mass index (BMI) of 35.0 to 35.9 in adult, unspecified obesity type (HCC)  Cody Rodriguez is currently in the action stage of change. As such, his goal is to continue with weight loss efforts. He has agreed to the Category 3 Plan.   Exercise goals: All adults should avoid inactivity. Some physical activity is better than none, and adults who participate in any amount of physical activity gain some health benefits.  Behavioral modification strategies: increasing water intake, meal planning and cooking strategies, keeping healthy foods in the home, ways to avoid boredom eating and planning for success.  Cody Rodriguez has agreed to follow-up with our clinic in 2 weeks. He was informed of the importance of  frequent follow-up visits to maximize his success with intensive lifestyle modifications for his multiple health conditions.   Objective:   Blood pressure 117/74, pulse (!) 109, temperature 98.1 F (36.7 C), height 5\' 10"  (1.778 m), weight 245 lb (111.1 kg), SpO2 99 %. Body mass index is 35.15 kg/m.  General: Cooperative, alert, well developed, in no acute distress. HEENT: Conjunctivae and lids unremarkable. Cardiovascular: Regular rhythm.  Lungs: Normal work of breathing. Neurologic: No focal deficits.   Lab Results  Component Value Date   CREATININE 1.21 07/24/2020   BUN 24 (H) 07/24/2020   NA 137 07/24/2020   K 4.0 07/24/2020   CL 102 07/24/2020   CO2 24 07/24/2020   Lab Results  Component Value Date   ALT 61 (H) 07/24/2020   AST 32 07/24/2020   ALKPHOS 65 07/24/2020   BILITOT 0.6 07/24/2020   Lab Results  Component Value Date   HGBA1C 5.8 (H) 07/12/2020   HGBA1C 5.1 06/26/2014   Lab Results  Component Value Date   INSULIN 26.1 (H) 07/12/2020   Lab Results  Component  Value Date   TSH 1.410 07/12/2020   Lab Results  Component Value Date   CHOL 203 (H) 07/12/2020   HDL 38 (L) 07/12/2020   LDLCALC 146 (H) 07/12/2020   TRIG 107 07/12/2020   Lab Results  Component Value Date   WBC 14.1 (H) 07/24/2020   HGB 14.9 07/24/2020   HCT 45.2 07/24/2020   MCV 85.0 07/24/2020   PLT 443 (H) 07/24/2020   Attestation Statements:   Reviewed by clinician on day of visit: allergies, medications, problem list, medical history, surgical history, family history, social history, and previous encounter notes.  I, Insurance claims handler, CMA, am acting as Energy manager for [employer], DO.  I have reviewed the above documentation for accuracy and completeness, and I agree with the above. Carlye Grippe, D.O.  The 21st Century Cures Act was signed into law in 2016 which includes the topic of electronic health records.  This provides immediate access to information in  MyChart.  This includes consultation notes, operative notes, office notes, lab results and pathology reports.  If you have any questions about what you read please let us know at your next visit so we can discuss your concerns and take corrective action if need be.  We are right here with you.

## 2020-08-02 ENCOUNTER — Telehealth: Payer: Self-pay | Admitting: *Deleted

## 2020-08-02 ENCOUNTER — Encounter: Payer: Self-pay | Admitting: Neurology

## 2020-08-02 ENCOUNTER — Ambulatory Visit (INDEPENDENT_AMBULATORY_CARE_PROVIDER_SITE_OTHER): Payer: 59 | Admitting: Neurology

## 2020-08-02 ENCOUNTER — Other Ambulatory Visit: Payer: Self-pay

## 2020-08-02 VITALS — BP 135/82 | HR 82 | Ht 70.0 in | Wt 248.5 lb

## 2020-08-02 DIAGNOSIS — R269 Unspecified abnormalities of gait and mobility: Secondary | ICD-10-CM

## 2020-08-02 DIAGNOSIS — Z79899 Other long term (current) drug therapy: Secondary | ICD-10-CM

## 2020-08-02 DIAGNOSIS — G35 Multiple sclerosis: Secondary | ICD-10-CM | POA: Diagnosis not present

## 2020-08-02 DIAGNOSIS — R270 Ataxia, unspecified: Secondary | ICD-10-CM

## 2020-08-02 DIAGNOSIS — R531 Weakness: Secondary | ICD-10-CM

## 2020-08-02 NOTE — Telephone Encounter (Signed)
Placed [facility] lab in [facility] lock box for routine lab pick up. Results pending. 

## 2020-08-02 NOTE — Progress Notes (Addendum)
[facility]  PATIENT: Cody Rodriguez DOB: 09-26-1986  REFERRING DOCTOR OR PCP:  Hamilton Capri, DO SOURCE: Patient, notes from recent hospital admission, imaging and laboratory reports, MRI images personally reviewed.  _________________________________   HISTORICAL  CHIEF COMPLAINT:  Chief Complaint  Patient presents with  . New Patient (Initial Visit)    RM 12 with [name] (fiance). Hospital f/u to establish care for MS. Went to [facility] 07/20/20-07/24/20. Presented there with difficulty with balance, ambulation, left frontal headache, blurry vision, slurred speech, weakness. Received IV steroids x 5 days. Referred to PT.  Ambulates with quad cane. He will start PT next week (neurorehab). Left leg weaker than the right currently. Having constant leg numbness from knees down. Has no family hx of MS.  Marland Kitchen Eye Problem    Vision problems have resolved since being discharged from the hospital Has never been to an eye doctor for an evaluation.    HISTORY OF PRESENT ILLNESS:  I had the pleasure of seeing your patient, Cody Rodriguez, at [facility] for neurologic consultation regarding his recent diagnosis of relapsing remitting multiple sclerosis.  He is a 34 year old man who presented to [facility] emergency room 07/20/2020 with reduced balance, poor gait, headache, blurry vision, slurred speech and weakness for 2 days.   The left leg was more clumsy and weak than the right. He needed a walker.   Arms were strong.    He had diplopia and his fiance noted his eyes were not conjugate.   He had no nystagmus.   Monocular vision was fine.  MRI of the brain showed changes consistent with MS and he was admitted and received 5 days of IV Solu-Medrol.  In retrospect, he had noted several episodes of poor balance, dizziness or weakness but he would improve after a few days to a week or two so did not seek medical help over the past 3 years.   He had an episode with  abdominal numbness x 3-4 weeks about 2-3 years ago.   He had some balance issues and also felt weakness in the arms and legs that persisted.   He had not had vision problems until the past couple weeks.    Currently, he is able to walk with a cane and notes that the left leg is mildly weak.   He also notes numbness in both legs but not in the arms.   He no longer has diplopia.   He denies any problems with bladder function.      He has noted more fatigue than he had in the past.   He is sleeping well.  He denies issues with mood or cognition.    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    He has no family history of MS.  Notes and studies from his hospitalization were reviewed.    07/12/2020 labs:  Vitamin D was 11.6 , mild hyperlipidemia.  11/25/2019 he had rhabdomyolysis after exercising while dehydrated.     IMAGING MRI of the brain 07/20/2020 shows multiple T2/FLAIR hyperintense foci in the hemispheres including some in the periventricular white matter, radially oriented to the ventricles.  There is a large focus in the right corona radiata and centrum semiovale with restricted diffusion and some rim enhancement  MRI of the cervical spine 07/21/2020 shows T2 hyperintense foci at C2-C3, C4, C5 and C6-C7.  There is some enhancement in the anterior spinal cord at C3 consistent with an acute focus.  MRI of the thoracic spine 07/21/2020 shows T2 hyperintense foci centrally at T4 and T9-T10  MRI of the lumbar spine 05/12/2020 shows facet hypertrophy and ligamenta flava hypertrophy at L4-L5 with some encroachment upon the left L5 nerve root in the lateral recess.   REVIEW OF SYSTEMS: Constitutional: No fevers, chills, sweats, or change in appetite Eyes: No visual changes, double vision, eye pain Ear, nose and throat: No hearing loss, ear pain, nasal congestion, sore throat Cardiovascular: No chest pain, palpitations Respiratory: No shortness of breath at rest or with  exertion.   No wheezes GastrointestinaI: No nausea, vomiting, diarrhea, abdominal pain, fecal incontinence Genitourinary: No dysuria, urinary retention or frequency.  No nocturia. Musculoskeletal: No neck pain, back pain Integumentary: No rash, pruritus, skin lesions Neurological: as above Psychiatric: No depression at this time.  No anxiety Endocrine: No palpitations, diaphoresis, change in appetite, change in weigh or increased thirst Hematologic/Lymphatic: No anemia, purpura, petechiae. Allergic/Immunologic: No itchy/runny eyes, nasal congestion, recent allergic reactions, rashes  ALLERGIES: No Known Allergies  HOME MEDICATIONS:  Current Outpatient Medications:  .  acetaminophen (TYLENOL) 325 MG tablet, Take 650 mg by mouth every 6 (six) hours as needed for mild pain, fever or headache., Disp: , Rfl:  .  amLODipine (NORVASC) 5 MG tablet, Take 1 tablet (5 mg total) by mouth daily., Disp: 30 tablet, Rfl: 0 .  meloxicam (MOBIC) 15 MG tablet, Take 15 mg by mouth daily as needed for pain. , Disp: , Rfl:  .  Multiple Vitamin (MULTIVITAMIN) capsule, Take 1 capsule by mouth daily., Disp: , Rfl:  .  naproxen (NAPROSYN) 500 MG tablet, Take 500 mg by mouth daily as needed for mild pain or headache. , Disp: , Rfl:  .  Vitamin D, Ergocalciferol, (DRISDOL) 1.25 MG (50000 UNIT) CAPS capsule, Take 1 capsule (50,000 Units total) by mouth every 7 (seven) days., Disp: 4 capsule, Rfl: 0  PAST MEDICAL HISTORY: Past Medical History:  Diagnosis Date  . Gynecomastia, male 07/02/2014  . High blood pressure   . Low back pain   . Multiple sclerosis (HCC) 07/21/2020  . Paresthesia and pain of both upper extremities    also in right leg  . Rhabdomyolysis 11/24/2019  . Swelling     PAST SURGICAL HISTORY: Past Surgical History:  Procedure Laterality Date  . FINGER SURGERY Left    left pinky  . ROTATOR CUFF REPAIR Left     FAMILY HISTORY: Family History  Problem Relation Age of Onset  . Healthy  Mother   . Diabetes Mother   . Hypertension Mother   . Hyperlipidemia Mother   . Sleep apnea Mother   . Hypertension Father   . Hyperlipidemia Father   . Healthy Sister   . Healthy Brother   . Diabetes Maternal Aunt   . Neuropathy Neg Hx   . Multiple sclerosis Neg Hx     SOCIAL HISTORY:  Social History   Socioeconomic History  . Marital status: Single    Spouse name: Not on file  . Number of children: 0  . Years of education: 81  . Highest education level: Not on file  Occupational History  . Occupation: Clinical research associate: [employer]  Tobacco Use  . Smoking status: Former Smoker    Packs/day: 0.50    Years: 10.00    Pack years: 5.00    Types: Cigarettes  . Smokeless tobacco: Never Used  . Tobacco comment: 10/25/15 cut back a little  Substance and Sexual Activity  .  Alcohol use: Not Currently    Alcohol/week: 0.0 standard drinks  . Drug use: No  . Sexual activity: Not on file  Other Topics Concern  . Not on file  Social History Narrative   Right handed   No caffeine    He also eats chocolate occasionally.   Social Determinants of Health   Financial Resource Strain:   . Difficulty of Paying Living Expenses: Not on file  Food Insecurity:   . Worried About Programme researcher, broadcasting/film/video in the Last Year: Not on file  . Ran Out of Food in the Last Year: Not on file  Transportation Needs:   . Lack of Transportation (Medical): Not on file  . Lack of Transportation (Non-Medical): Not on file  Physical Activity:   . Days of Exercise per Week: Not on file  . Minutes of Exercise per Session: Not on file  Stress:   . Feeling of Stress : Not on file  Social Connections:   . Frequency of Communication with Friends and Family: Not on file  . Frequency of Social Gatherings with Friends and Family: Not on file  . Attends Religious Services: Not on file  . Active Member of Clubs or Organizations: Not on file  . Attends Banker Meetings: Not on file  . Marital  Status: Not on file  Intimate Partner Violence:   . Fear of Current or Ex-Partner: Not on file  . Emotionally Abused: Not on file  . Physically Abused: Not on file  . Sexually Abused: Not on file     PHYSICAL EXAM  Vitals:   08/02/20 1303  BP: 135/82  Pulse: 82  Weight: 248 lb 8 oz (112.7 kg)  Height: 5\' 10"  (1.778 m)    Body mass index is 35.66 kg/m.   General: The patient is well-developed and well-nourished and in no acute distress  HEENT:  Head is [place]/AT.  Sclera are anicteric.  Funduscopic exam shows normal optic discs and retinal vessels.  Neck: No carotid bruits are noted.  The neck is nontender.  Cardiovascular: The heart has a regular rate and rhythm with a normal S1 and S2. There were no murmurs, gallops or rubs.    Skin: Extremities are without rash or  edema.  Musculoskeletal:  Back is nontender  Neurologic Exam  Mental status: The patient is alert and oriented x 3 at the time of the examination. The patient has apparent normal recent and remote memory, with an apparently normal attention span and concentration ability.   Speech is normal.  Cranial nerves: Extraocular movements are full. Pupils are equal, round, and reactive to light and accomodation.  Visual fields are full. Symmetric color vision.   Facial symmetry is present. There is good facial sensation to soft touch bilaterally.Facial strength is normal.  Trapezius and sternocleidomastoid strength is normal. No dysarthria is noted.  The tongue is midline, and the patient has symmetric elevation of the soft palate. No obvious hearing deficits are noted.  Motor:  Muscle bulk is normal.   Tone is normal. Strength is  5 / 5 except 4+/5 left toe extensors. . Reduced RAM on left.    Sensory: Sensory testing is intact to pinprick, soft touch and vibration sensation in all 4 extremities.  Coordination: Cerebellar testing reveals mildly reduced left finger-nose-finger and moderately reduced left  heel-to-shin  .  Gait and station: Station is normal.   Gait is wide and he does better with cane.   Cannot tandem.   Romberg  is positive.   Reflexes: Deep tendon reflexes are symmetric and increased in left arm and leg relative to right.   Plantar responses are flexor right and extensor left    DIAGNOSTIC DATA (LABS, IMAGING, TESTING) - I reviewed patient records, labs, notes, testing and imaging myself where available.  Lab Results  Component Value Date   WBC 14.1 (H) 07/24/2020   HGB 14.9 07/24/2020   HCT 45.2 07/24/2020   MCV 85.0 07/24/2020   PLT 443 (H) 07/24/2020      Component Value Date/Time   NA 137 07/24/2020 1023   NA 137 07/12/2020 1220   K 4.0 07/24/2020 1023   CL 102 07/24/2020 1023   CO2 24 07/24/2020 1023   GLUCOSE 158 (H) 07/24/2020 1023   BUN 24 (H) 07/24/2020 1023   BUN 9 07/12/2020 1220   CREATININE 1.21 07/24/2020 1023   CREATININE 1.16 06/26/2014 1402   CALCIUM 9.1 07/24/2020 1023   PROT 7.2 07/24/2020 1023   PROT 7.5 07/12/2020 1220   ALBUMIN 3.6 07/24/2020 1023   ALBUMIN 4.5 07/12/2020 1220   AST 32 07/24/2020 1023   ALT 61 (H) 07/24/2020 1023   ALKPHOS 65 07/24/2020 1023   BILITOT 0.6 07/24/2020 1023   BILITOT 0.5 07/12/2020 1220   GFRNONAA >60 07/24/2020 1023   GFRNONAA 85 06/26/2014 1402   GFRAA 93 07/12/2020 1220   GFRAA >89 06/26/2014 1402   Lab Results  Component Value Date   CHOL 203 (H) 07/12/2020   HDL 38 (L) 07/12/2020   LDLCALC 146 (H) 07/12/2020   TRIG 107 07/12/2020   Lab Results  Component Value Date   HGBA1C 5.8 (H) 07/12/2020   Lab Results  Component Value Date   VITAMINB12 1,217 07/12/2020   Lab Results  Component Value Date   TSH 1.410 07/12/2020       ASSESSMENT AND PLAN  Multiple sclerosis (HCC) - Plan: Stratify JCV Antibody Test ([facility]), Hepatitis B core antibody, total, Hepatitis B surface antigen, Neuromyelitis optica autoab, IgG, HIV Antibody (routine testing w rflx), IgG, IgA, IgM, QuantiFERON-TB Gold Plus,  Hepatitis B surface antibody,qualitative, Varicella zoster antibody, IgG  High risk medication use - Plan: Stratify JCV Antibody Test ([facility]), Hepatitis B core antibody, total, Hepatitis B surface antigen, Neuromyelitis optica autoab, IgG, HIV Antibody (routine testing w rflx), IgG, IgA, IgM, QuantiFERON-TB Gold Plus, Hepatitis B surface antibody,qualitative, Varicella zoster antibody, IgG  Gait disturbance  Left-sided weakness  Ataxia   In summary, Mr. Isakson is a 34 year old man with a recent diagnosis of MS.  In retrospect, he has had some neurologic symptoms for several years.  MRI showed a large enhancing lesion in the right posterior frontal lobe that could affect the corticospinal tract and another enhancing lesion at C3.  Additionally there were at least 7 foci within the spinal cord and multiple foci elsewhere in the hemispheres and some in the brainstem and cerebellar peduncle.  Because he is presenting with a higher than average level of aggressiveness, I recommend that we initiate a highly effective disease modifying therapy.  In detail, we discussed the risks and benefits of Tysabri, Ocrevus and Kesimpta.  We will check lab work to determine the safety of using these medications.  Went over the results return I will discuss them so we can come to a decision of treatment.  If the dysesthesias worsen I can add gabapentin or pregabalin.  He will return to see me in several months or sooner if there are new or worsening neurologic symptoms.  Hanad Leino A. Epimenio Foot, MD, [facility] 08/02/2020, 5:28 PM Certified in Neurology, Clinical Neurophysiology, Sleep Medicine and Neuroimaging  [facility] [address], [address] [place], [place] [address] [phone]

## 2020-08-05 LAB — HIV ANTIBODY (ROUTINE TESTING W REFLEX): HIV Screen 4th Generation wRfx: NONREACTIVE

## 2020-08-05 LAB — HEPATITIS B SURFACE ANTIBODY,QUALITATIVE: Hep B Surface Ab, Qual: NONREACTIVE

## 2020-08-05 LAB — IGG, IGA, IGM
IgA/Immunoglobulin A, Serum: 310 mg/dL (ref 90–386)
IgG (Immunoglobin G), Serum: 1041 mg/dL (ref 603–1613)
IgM (Immunoglobulin M), Srm: 68 mg/dL (ref 20–172)

## 2020-08-05 LAB — HEPATITIS B CORE ANTIBODY, TOTAL: Hep B Core Total Ab: NEGATIVE

## 2020-08-05 LAB — QUANTIFERON-TB GOLD PLUS
QuantiFERON Mitogen Value: >10 IU/mL
QuantiFERON Nil Value: 0.01 IU/mL
QuantiFERON TB1 Ag Value: 0.32 IU/mL
QuantiFERON TB2 Ag Value: 0.17 IU/mL
QuantiFERON-TB Gold Plus: NEGATIVE

## 2020-08-05 LAB — VARICELLA ZOSTER ANTIBODY, IGG: Varicella zoster IgG: 1813 index (ref >165)

## 2020-08-05 LAB — HEPATITIS B SURFACE ANTIGEN: Hepatitis B Surface Ag: NEGATIVE

## 2020-08-05 LAB — NEUROMYELITIS OPTICA AUTOAB, IGG: NMO IgG Autoantibodies: <1.5 U/mL (ref 0.0–3.0)

## 2020-08-06 ENCOUNTER — Other Ambulatory Visit: Payer: Self-pay

## 2020-08-06 ENCOUNTER — Telehealth: Payer: Self-pay | Admitting: *Deleted

## 2020-08-06 ENCOUNTER — Ambulatory Visit: Payer: 59 | Attending: Family Medicine | Admitting: Physical Therapy

## 2020-08-06 ENCOUNTER — Ambulatory Visit: Payer: 59 | Admitting: Occupational Therapy

## 2020-08-06 ENCOUNTER — Encounter: Payer: Self-pay | Admitting: Physical Therapy

## 2020-08-06 DIAGNOSIS — R278 Other lack of coordination: Secondary | ICD-10-CM | POA: Insufficient documentation

## 2020-08-06 DIAGNOSIS — R2681 Unsteadiness on feet: Secondary | ICD-10-CM | POA: Diagnosis present

## 2020-08-06 DIAGNOSIS — R2689 Other abnormalities of gait and mobility: Secondary | ICD-10-CM

## 2020-08-06 DIAGNOSIS — M6281 Muscle weakness (generalized): Secondary | ICD-10-CM | POA: Diagnosis present

## 2020-08-06 NOTE — Patient Instructions (Signed)
  SINGLE LIMB STANCE    Stance: single leg on floor. Raise leg. Hold _10__ seconds. Repeat with other leg. _2__ reps per set, _2-3__ sets per day, _5__ days per week  Copyright  VHI. All rights reserved.     Tandem Stance    Right foot in front of left, heel touching toe both feet "straight ahead". Stand on Foot Triangle of Support with both feet. Balance in this position _30__ seconds. Do with left foot in front of right.  Copyright  VHI. All rights reserved.    Heel Raise: Unilateral (Standing)    Balance on left foot, then rise on ball of foot. Repeat _10___ times per set. Do __3__ sets per session. Do __2__ sessions per day.  http://orth.exer.us/40   Copyright  VHI. All rights reserved.

## 2020-08-06 NOTE — Therapy (Signed)
[facility] [address] [place], [place], [address] Phone: [phone]   Fax:  [phone]  Occupational Therapy Evaluation  Patient Details  Name: Cody Rodriguez MRN: [id number] Date of Birth: [birth year] Referring Provider (OT): Dr. Despina Arias   Encounter Date: 08/06/2020   OT End of Session - 08/06/20 1003    Visit Number 1    Number of Visits 5    Date for OT Re-Evaluation 09/05/20    Authorization Type UHC    OT Start Time [phone]    OT Stop Time 1005    OT Time Calculation (min) 40 min    Activity Tolerance Patient tolerated treatment well    Behavior During Therapy [facility] for tasks assessed/performed           Past Medical History:  Diagnosis Date  . Gynecomastia, male 07/02/2014  . High blood pressure   . Low back pain   . Multiple sclerosis (HCC) 07/21/2020  . Paresthesia and pain of both upper extremities    also in right leg  . Rhabdomyolysis 11/24/2019  . Swelling     Past Surgical History:  Procedure Laterality Date  . FINGER SURGERY Left    left pinky  . ROTATOR CUFF REPAIR Left     There were no vitals filed for this visit.   Subjective Assessment - 08/06/20 0925    Patient is accompanied by: Family member   fiance [name])   Pertinent History MS recently diagnosed. PMH: HTN, chronic LBP, rhabdomyolysis, gynecomastia    Limitations none    Currently in Pain? No/denies             [facility] OT Assessment - 08/06/20 0001      Assessment   Medical Diagnosis MS    Referring Provider (OT) Dr. Despina Arias    Onset Date/Surgical Date --   Recently diagnosed 07/20/20   Hand Dominance Right    Prior Therapy none      Precautions   Precautions None      Restrictions   Weight Bearing Restrictions No      Balance Screen   Has the patient fallen in the past 6 months No      Home  Environment   Home Layout One level    Bathroom Shower/Tub Tub/Shower unit;Curtain    Lives With Spouse    41 y.o. daughter     Prior Function   Level of Independence Independent    Vocation Full time employment    Counsellor for [employer]      ADL   Eating/Feeding Independent    Grooming Independent    Ship broker -  Database administrator Independent    ADL comments Pt reports he was having trouble with ADLS prior to steroids      IADL   Shopping --   fiancee and pt go together   Light Housekeeping Performs light daily tasks such as dishwashing, bed making   has not been able to do yardwork or taking out trash   Meal Prep Able to complete simple cold meal and snack prep;Able to complete simple warm meal prep   Pt didn't cook before   [employer] own vehicle    Medication Management Is responsible for taking medication  in correct dosages at correct time      Mobility   Mobility Status Comments walks w/ cane in community, but independent inside home      Written Expression   Dominant Hand Right    Handwriting --   denies change     Vision - History   Additional Comments Pt's diplopia and blurred vision has resolved since steroids      Vision Assessment   Ocular Range of Motion Within Functional Limits    Convergence Within functional limits      Activity Tolerance   Activity Tolerance --   Pt reports energy has increased since steroids     Cognition   Cognition Comments Pt reports WFL's      Sensation   Additional Comments Pt reports numbness resolved in hands but still reports some numbness bilateral LE's from knees down      Coordination   9 Hole Peg Test Right;Left    Right 9 Hole Peg Test 28.06 sec    Left 9 Hole Peg Test 40.87 sec      Edema   Edema none in UE's      ROM / Strength   AROM / PROM / Strength AROM;Strength      AROM    Overall AROM Comments BUE AROM WNL's      Strength   Overall Strength Comments RUE MMT grossly 5/5 at shoulder,  LUE MMT 4/5 at shoulder - but reports rotator cuff surgery years ago. Bilateral elbows 5/5      Hand Function   Right Hand Grip (lbs) 113.5 lbs    Left Hand Grip (lbs) 109.1 lbs                           OT Education - 08/06/20 1004    Education Details MS society info, local support groups, avoiding extreme heat/cold and sweating    Person(s) Educated Patient;Spouse    Methods Explanation;Handout    Comprehension Verbalized understanding               OT Long Term Goals - 08/06/20 1014      OT LONG TERM GOAL #1   Title Independent with coordination HEP    Time 4    Period Weeks    Status New      OT LONG TERM GOAL #2   Title Independent with UE strengthening HEP    Time 4    Period Weeks    Status New      OT LONG TERM GOAL #3   Title Pt to verbalize understanding with energy conservation techniques    Time 4    Period Weeks    Status New      OT LONG TERM GOAL #4   Title Pt to verbalize understanding with MS Society information and support groups    Time 4    Period Weeks    Status Achieved                 Plan - 08/06/20 1005    Clinical Impression Statement Pt is a 34 y.o. male who presents to OPOT for evaluation due to newly diagnosed MS. Pt reports having issues in years past but never got diagnosed until now. Pt is doing well and most symptoms have resolved at this time since steroid injections. Pt has mild coordination deficits, balance deficits and endurance.    OT Occupational Profile and History Problem Focused Assessment -  Including review of records relating to presenting problem    Occupational performance deficits (Please refer to evaluation for details): IADL's;Work;Social Participation    Body Structure / Function / Physical Skills IADL;UE functional use;Strength;Endurance;Balance;Coordination;FMC    Rehab  Potential Good    Clinical Decision Making Limited treatment options, no task modification necessary    Comorbidities Affecting Occupational Performance: May have comorbidities impacting occupational performance    Modification or Assistance to Complete Evaluation  No modification of tasks or assist necessary to complete eval    OT Frequency 1x / week    OT Duration 4 weeks   plus eval   OT Treatment/Interventions DME and/or AE instruction;Therapeutic activities;Therapeutic exercise;Coping strategies training;Neuromuscular education;Patient/family education;Energy conservation    Plan coordination HEP, energy conservation techniques           Patient will benefit from skilled therapeutic intervention in order to improve the following deficits and impairments:   Body Structure / Function / Physical Skills: IADL, UE functional use, Strength, Endurance, Balance, Coordination, FMC       Visit Diagnosis: Other lack of coordination  Muscle weakness (generalized)  Unsteadiness on feet    Problem List Patient Active Problem List   Diagnosis Date Noted  . High risk medication use 08/02/2020  . Ataxia 08/02/2020  . Essential hypertension 07/26/2020  . Other hyperlipidemia 07/26/2020  . Vitamin D deficiency 07/26/2020  . Prediabetes 07/26/2020  . At risk for activity intolerance 07/26/2020  . Hypertension 07/21/2020  . Multiple sclerosis (HCC) 07/21/2020  . Gait disturbance 07/20/2020  . Thrombocytosis 07/20/2020  . Elevated serum creatinine 07/20/2020  . Hyperglycemia 07/20/2020  . Magnetic resonance imaging of brain abnormal 07/20/2020  . Transaminitis 11/25/2019  . Rhabdomyolysis 11/24/2019  . Paresthesia and pain of both upper extremities   . Current smoker 07/02/2014  . Paresthesias 07/02/2014  . Left-sided weakness 07/02/2014  . Gynecomastia, male 07/02/2014    Kelli Churn, OTR/L 08/06/2020, 12:10 PM  [place] [facility] [address] [place], [place], [address] Phone: [phone]   Fax:  [phone]  Name: Cody Rodriguez MRN: [id number] Date of Birth: 1986-01-02

## 2020-08-06 NOTE — Telephone Encounter (Signed)
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 He is a newly diagnosed MS patient. Per vo by Dr. Epimenio Foot, he will write him out of work completely through 10/05/20. His labs are still pending. His treatment has not yet been determined. He has a pending follow up on 10/22/2020. Intermittent absences will be re-evaluated after treatment is started.  The paperwork has been completed, signed and returned to [place] in medical records.

## 2020-08-07 NOTE — Telephone Encounter (Signed)
JCV ab drawn on 08/02/2020 positive, index: 0.81. Gave to MD for review.

## 2020-08-07 NOTE — Therapy (Signed)
[facility] [address] [place], [place], [address] Phone: [phone]   Fax:  [phone]  Physical Therapy Evaluation  Patient Details  Name: Cody Rodriguez MRN: [id number] Date of Birth: Jan 10, 1986 Referring Provider (PT): Dr. Despina Arias   Encounter Date: 08/06/2020   PT End of Session - 08/07/20 1904    Visit Number 1    Number of Visits 9    Date for PT Re-Evaluation 09/07/20    Authorization Type UHC    Authorization - Number of Visits 35    PT Start Time 1017    PT Stop Time 1101    PT Time Calculation (min) 44 min    Activity Tolerance Patient tolerated treatment well    Behavior During Therapy [facility] for tasks assessed/performed           Past Medical History:  Diagnosis Date  . Gynecomastia, male 07/02/2014  . High blood pressure   . Low back pain   . Multiple sclerosis (HCC) 07/21/2020  . Paresthesia and pain of both upper extremities    also in right leg  . Rhabdomyolysis 11/24/2019  . Swelling     Past Surgical History:  Procedure Laterality Date  . FINGER SURGERY Left    left pinky  . ROTATOR CUFF REPAIR Left     There were no vitals filed for this visit.        [facility] [place] PT Assessment - 08/07/20 0001      Assessment   Medical Diagnosis Multiple Sclerosis    Referring Provider (PT) Dr. Despina Arias    Onset Date/Surgical Date 07/20/20    Hand Dominance Right    Prior Therapy none      Precautions   Precautions None      Restrictions   Weight Bearing Restrictions No      Balance Screen   Has the patient fallen in the past 6 months No    Has the patient had a decrease in activity level because of a fear of falling?  No    Is the patient reluctant to leave their home because of a fear of falling?  No      Home Environment   Living Environment Private residence    Type of Home House    Home Access Stairs to enter    Entrance Stairs-Number of Steps 1    Entrance  Stairs-Rails None    Home Layout One level    Home Equipment [place] - quad      Prior Function   Level of Independence Independent    Vocation Full time employment    Counsellor for [employer]      ROM / Strength   AROM / PROM / Strength Strength      Strength   Overall Strength Deficits    Strength Assessment Site Hip;Knee;Ankle    Right/Left Hip Left    Left Hip Flexion 4-/5    Left Hip Extension 3+/5    Left Hip ABduction 4-/5    Right/Left Knee Left    Left Knee Flexion 4-/5    Left Knee Extension 5/5    Right/Left Ankle Left    Left Ankle Dorsiflexion 4-/5    Left Ankle Plantar Flexion 3+/5      Transfers   Transfers Sit to Stand;Stand to Sit    Sit to Stand 6: Modified independent (Device/Increase time)      Ambulation/Gait   Ambulation/Gait Yes    Ambulation Distance (Feet)  125 Feet    Assistive device None    Ambulation Surface Level;Indoor    Gait velocity 9.31 secs = 3.52 ft/sec      Standardized Balance Assessment   Standardized Balance Assessment Timed Up and Go Test      Timed Up and Go Test   TUG Normal TUG    Normal TUG (seconds) 9.41    TUG Comments no device used      High Level Balance   High Level Balance Comments LLE SLS 4.09 secs      Functional Gait  Assessment   Gait assessed  Yes    Gait Level Surface Walks 20 ft, slow speed, abnormal gait pattern, evidence for imbalance or deviates 10-15 in outside of the 12 in walkway width. Requires more than 7 sec to ambulate 20 ft.   8.37   Change in Gait Speed Able to change speed, demonstrates mild gait deviations, deviates 6-10 in outside of the 12 in walkway width, or no gait deviations, unable to achieve a major change in velocity, or uses a change in velocity, or uses an assistive device.    Gait with Horizontal Head Turns Performs head turns smoothly with slight change in gait velocity (eg, minor disruption to smooth gait path), deviates 6-10 in outside 12 in walkway width, or  uses an assistive device.    Gait with Vertical Head Turns Performs task with slight change in gait velocity (eg, minor disruption to smooth gait path), deviates 6 - 10 in outside 12 in walkway width or uses assistive device    Gait and Pivot Turn Pivot turns safely within 3 sec and stops quickly with no loss of balance.    Step Over Obstacle Is able to step over 2 stacked shoe boxes taped together (9 in total height) without changing gait speed. No evidence of imbalance.    Gait with Narrow Base of Support Ambulates less than 4 steps heel to toe or cannot perform without assistance.   2 steps   Gait with Eyes Closed Walks 20 ft, uses assistive device, slower speed, mild gait deviations, deviates 6-10 in outside 12 in walkway width. Ambulates 20 ft in less than 9 sec but greater than 7 sec.    Ambulating Backwards Walks 20 ft, no assistive devices, good speed, no evidence for imbalance, normal gait    Steps Alternating feet, must use rail.    Total Score 20                      Objective measurements completed on examination: See above findings.               PT Education - 08/07/20 1902    Education Details instructed in LLE SLS, tandem stance and Lt heel raise for HEP    Person(s) Educated Patient    Methods Explanation;Demonstration;Handout    Comprehension Verbalized understanding;Returned demonstration            PT Short Term Goals - 08/07/20 1915      PT SHORT TERM GOAL #1   Title same as LTG's             PT Long Term Goals - 08/07/20 1915      PT LONG TERM GOAL #1   Title Increase FGA score to >/= 27/30 to increase safety and balance with ambulation.    Baseline 20/30 - 08-06-20    Time 4    Period Weeks    Status New  Target Date 09/07/20      PT LONG TERM GOAL #2   Title Increase gait velocity from 3.52 ft/sec to >/= 3.9 ft/sec without device for incr. gait efficiency.    Baseline 9.31 secs = 3.52 ft/sec without device    Time 4     Period Weeks    Status New    Target Date 09/07/20      PT LONG TERM GOAL #3   Title Pt will negotiate 4 steps without use of hand rail using a step over step sequence to demo improved balance.    Time 4    Period Weeks    Status New    Target Date 09/07/20      PT LONG TERM GOAL #4   Title Increase SLS on LLE to >/= 8 secs for increased safety with mobility.    Baseline 4.09 secs    Time 4    Period Weeks    Status New    Target Date 09/07/20      PT LONG TERM GOAL #5   Title Independent in HEP for LLE strengthening and balance exercises.    Time 4    Period Weeks    Status New    Target Date 09/07/20                  Plan - 08/07/20 1905    Clinical Impression Statement Pt is a 34 yr old gentleman diagnosed with MS on 07-20-20.  Pt presents with decreased strength in LLE with decreased muscle endurance and  decreased high level balance and gait deficits.  Pt's FGA score 20/30, TUG score 9.41.  Pt will benefit from PT to address LLE weakness, decreased balance and gait and decreased endurance.    Personal Factors and Comorbidities Comorbidity 2;Profession    Comorbidities chronic LBP, rhabdomyolysis (Feb. 2021), HTN, paresthesias LUE & LLE    Examination-Activity Limitations Locomotion Level;Squat;Stairs;Lift    Examination-Participation Restrictions Meal Prep;Cleaning;Community Activity;Laundry;Occupation;Shop    Rehab Potential Good    PT Frequency 2x / week    PT Duration 4 weeks   eval + 2x/week for 4 weeks = 9 visits   PT Next Visit Plan check HEP initiated on 08-06-20 (eval) - add strengthening exs for LLE to HEP (with theraband);   LLE strengthening, high level balance, gait without device    PT Home Exercise Plan SLS, tandem stance, heel raises    Consulted and Agree with Plan of Care Patient;Other (Comment)   fiancee          Patient will benefit from skilled therapeutic intervention in order to improve the following deficits and impairments:  Abnormal  gait, Decreased endurance, Decreased activity tolerance, Decreased balance, Decreased strength, Impaired sensation  Visit Diagnosis: Other abnormalities of gait and mobility - Plan: PT plan of care cert/re-cert  Muscle weakness (generalized) - Plan: PT plan of care cert/re-cert  Unsteadiness on feet - Plan: PT plan of care cert/re-cert     Problem List Patient Active Problem List   Diagnosis Date Noted  . High risk medication use 08/02/2020  . Ataxia 08/02/2020  . Essential hypertension 07/26/2020  . Other hyperlipidemia 07/26/2020  . Vitamin D deficiency 07/26/2020  . Prediabetes 07/26/2020  . At risk for activity intolerance 07/26/2020  . Hypertension 07/21/2020  . Multiple sclerosis (HCC) 07/21/2020  . Gait disturbance 07/20/2020  . Thrombocytosis 07/20/2020  . Elevated serum creatinine 07/20/2020  . Hyperglycemia 07/20/2020  . Magnetic resonance imaging of brain abnormal 07/20/2020  .  Transaminitis 11/25/2019  . Rhabdomyolysis 11/24/2019  . Paresthesia and pain of both upper extremities   . Current smoker 07/02/2014  . Paresthesias 07/02/2014  . Left-sided weakness 07/02/2014  . Gynecomastia, male 07/02/2014    Kary Kos, PT 08/07/2020, 7:26 PM  [facility] [address] [place], [place], [address] Phone: [phone]   Fax:  [phone]  Name: Cody Rodriguez MRN: [id number] Date of Birth: June 21, 1986

## 2020-08-09 ENCOUNTER — Telehealth: Payer: Self-pay | Admitting: Neurology

## 2020-08-09 NOTE — Telephone Encounter (Signed)
We reviewed the labwork.  He is JCV Ab low positive (0.81) so we will start Ocrevus.

## 2020-08-10 ENCOUNTER — Ambulatory Visit: Payer: 59 | Admitting: Physical Therapy

## 2020-08-10 ENCOUNTER — Encounter: Payer: Self-pay | Admitting: Physical Therapy

## 2020-08-10 ENCOUNTER — Other Ambulatory Visit: Payer: Self-pay

## 2020-08-10 DIAGNOSIS — R2689 Other abnormalities of gait and mobility: Secondary | ICD-10-CM

## 2020-08-10 DIAGNOSIS — R2681 Unsteadiness on feet: Secondary | ICD-10-CM

## 2020-08-10 DIAGNOSIS — M6281 Muscle weakness (generalized): Secondary | ICD-10-CM

## 2020-08-10 NOTE — Therapy (Signed)
[facility] [address] [place], [place], [address] Phone: [phone]   Fax:  [phone]  Physical Therapy Treatment  Patient Details  Name: Cody Rodriguez MRN: [id number] Date of Birth: 03/24/1986 Referring Provider (PT): Dr. Despina Arias   Encounter Date: 08/10/2020   PT End of Session - 08/10/20 0934    Visit Number 2    Number of Visits 9    Date for PT Re-Evaluation 09/07/20    Authorization Type UHC    Authorization - Number of Visits 35    PT Start Time 0933    PT Stop Time 1014    PT Time Calculation (min) 41 min    Activity Tolerance Patient tolerated treatment well    Behavior During Therapy [facility] for tasks assessed/performed           Past Medical History:  Diagnosis Date  . Gynecomastia, male 07/02/2014  . High blood pressure   . Low back pain   . Multiple sclerosis (HCC) 07/21/2020  . Paresthesia and pain of both upper extremities    also in right leg  . Rhabdomyolysis 11/24/2019  . Swelling     Past Surgical History:  Procedure Laterality Date  . FINGER SURGERY Left    left pinky  . ROTATOR CUFF REPAIR Left     There were no vitals filed for this visit.   Subjective Assessment - 08/10/20 0933    Subjective No falls. No pain or falls to report. Working on [employer], has to hold for the single leg heel raise.    Pertinent History HTN, chronic low back pain, rhabdomyolysis in Feb. 2021, MS    Patient Stated Goals Strengthen LLE and improve balance; return to work    Currently in Pain? No/denies    Pain Score 0-No pain                 [facility] Adult PT Treatment/Exercise - 08/10/20 1009      Transfers   Transfers Sit to Stand;Stand to Sit    Sit to Stand 6: Modified independent (Device/Increase time)      Ambulation/Gait   Ambulation/Gait Yes    Ambulation/Gait Assistance 5: Supervision    Ambulation/Gait Assistance Details into/out of gym with cane. no device used around gym  wtih session.     Assistive device Small based quad cane;None    Gait Pattern Step-through pattern;Decreased stride length;Narrow base of support;Poor foot clearance - left    Ambulation Surface Level;Indoor      Exercises   Exercises Other Exercises    Other Exercises  issued/advanced HEP for LE strengthening and balance. Refer to [employer]bridge for full details. Min guard assist with balance ex's for safety.                Balance Exercises - 08/10/20 0001      Balance Exercises: Standing   Rockerboard Anterior/posterior;Lateral;EO;30 seconds;Limitations    Rockerboard Limitations performed both ways on balance board: holding the board steady for 30 sec's x 3 reps with occasional touch to bars. min guard to min assist for balance with cues for posture and weight shifting to assist with balance.           issued the following to HEP today:   Access Code: [email or web address] URL: [email or web address] Date: 08/10/2020 Prepared by: Sallyanne Kuster  Exercises Standing Hip Abduction with Resistance at Ankles and Counter Support - 1 x daily - 5 x weekly - 1 sets - 10 reps Standing Hip  Extension with Resistance at Ankles and Counter Support - 1 x daily - 5 x weekly - 1 sets - 10 reps Standing Single Leg Stance with Counter Support - 1 x daily - 5 x weekly - 1 sets - 3 reps - 30 hold Single Leg Heel Raise with Counter Support - 1 x daily - 5 x weekly - 1 sets - 10 reps Tandem Walking with Counter Support - 1 x daily - 5 x weekly - 1 sets - 3 reps Toe Walking with Counter Support - 1 x daily - 5 x weekly - 1 sets - 3 reps Heel Walking with Counter Support - 1 x daily - 5 x weekly - 1 sets - 3 reps    PT Education - 08/10/20 0958    Education Details updated HEP for strengthening and balance.    Person(s) Educated Patient;Parent(s)    Methods Explanation;Demonstration;Verbal cues;Handout    Comprehension Verbalized understanding;Returned demonstration;Verbal cues required;Need further  instruction            PT Short Term Goals - 08/07/20 1915      PT SHORT TERM GOAL #1   Title same as LTG's             PT Long Term Goals - 08/07/20 1915      PT LONG TERM GOAL #1   Title Increase FGA score to >/= 27/30 to increase safety and balance with ambulation.    Baseline 20/30 - 08-06-20    Time 4    Period Weeks    Status New    Target Date 09/07/20      PT LONG TERM GOAL #2   Title Increase gait velocity from 3.52 ft/sec to >/= 3.9 ft/sec without device for incr. gait efficiency.    Baseline 9.31 secs = 3.52 ft/sec without device    Time 4    Period Weeks    Status New    Target Date 09/07/20      PT LONG TERM GOAL #3   Title Pt will negotiate 4 steps without use of hand rail using a step over step sequence to demo improved balance.    Time 4    Period Weeks    Status New    Target Date 09/07/20      PT LONG TERM GOAL #4   Title Increase SLS on LLE to >/= 8 secs for increased safety with mobility.    Baseline 4.09 secs    Time 4    Period Weeks    Status New    Target Date 09/07/20      PT LONG TERM GOAL #5   Title Independent in HEP for LLE strengthening and balance exercises.    Time 4    Period Weeks    Status New    Target Date 09/07/20                 Plan - 08/10/20 0935    Clinical Impression Statement Today's skilled session initially addressed advancement of HEP for balance and strengthening with only fatigue reported. This improved with short rest breaks. Remainder of session addressed balance reactions on complaint surfaces with up to min assist needed. The pt is progressing toward goals and should benefit from continued PT to progress toward unmet goals.    Personal Factors and Comorbidities Comorbidity 2;Profession    Comorbidities chronic LBP, rhabdomyolysis (Feb. 2021), HTN, paresthesias LUE & LLE    Examination-Activity Limitations Locomotion Level;Squat;Stairs;Lift    Examination-Participation Restrictions  Meal  Prep;Cleaning;Community Activity;Laundry;Occupation;Shop    Rehab Potential Good    PT Frequency 2x / week    PT Duration 4 weeks   eval + 2x/week for 4 weeks = 9 visits   PT Next Visit Plan LLE strengthening, high level balance, gait without device    PT Home Exercise Plan Access Code: [email or web address]    Consulted and Agree with Plan of Care Patient;Other (Comment)   fiancee          Patient will benefit from skilled therapeutic intervention in order to improve the following deficits and impairments:  Abnormal gait, Decreased endurance, Decreased activity tolerance, Decreased balance, Decreased strength, Impaired sensation  Visit Diagnosis: Muscle weakness (generalized)  Unsteadiness on feet  Other abnormalities of gait and mobility     Problem List Patient Active Problem List   Diagnosis Date Noted  . High risk medication use 08/02/2020  . Ataxia 08/02/2020  . Essential hypertension 07/26/2020  . Other hyperlipidemia 07/26/2020  . Vitamin D deficiency 07/26/2020  . Prediabetes 07/26/2020  . At risk for activity intolerance 07/26/2020  . Hypertension 07/21/2020  . Multiple sclerosis (HCC) 07/21/2020  . Gait disturbance 07/20/2020  . Thrombocytosis 07/20/2020  . Elevated serum creatinine 07/20/2020  . Hyperglycemia 07/20/2020  . Magnetic resonance imaging of brain abnormal 07/20/2020  . Transaminitis 11/25/2019  . Rhabdomyolysis 11/24/2019  . Paresthesia and pain of both upper extremities   . Current smoker 07/02/2014  . Paresthesias 07/02/2014  . Left-sided weakness 07/02/2014  . Gynecomastia, male 07/02/2014    Sallyanne Kuster, PTA, [facility] [address] [place], [place] [address] [phone] 08/10/20, 4:13 PM   Name: Erin Uecker MRN: [id number] Date of Birth: [birth year]

## 2020-08-10 NOTE — Patient Instructions (Signed)
Access Code: [email or web address] URL: [email or web address] Date: 08/10/2020 Prepared by: Sallyanne Kuster  Exercises Standing Hip Abduction with Resistance at Ankles and Counter Support - 1 x daily - 5 x weekly - 1 sets - 10 reps Standing Hip Extension with Resistance at Ankles and Counter Support - 1 x daily - 5 x weekly - 1 sets - 10 reps Standing Single Leg Stance with Counter Support - 1 x daily - 5 x weekly - 1 sets - 3 reps - 30 hold Single Leg Heel Raise with Counter Support - 1 x daily - 5 x weekly - 1 sets - 10 reps Tandem Walking with Counter Support - 1 x daily - 5 x weekly - 1 sets - 3 reps Toe Walking with Counter Support - 1 x daily - 5 x weekly - 1 sets - 3 reps Heel Walking with Counter Support - 1 x daily - 5 x weekly - 1 sets - 3 reps

## 2020-08-13 ENCOUNTER — Encounter (INDEPENDENT_AMBULATORY_CARE_PROVIDER_SITE_OTHER): Payer: Self-pay | Admitting: Family Medicine

## 2020-08-13 ENCOUNTER — Ambulatory Visit (INDEPENDENT_AMBULATORY_CARE_PROVIDER_SITE_OTHER): Payer: 59 | Admitting: Family Medicine

## 2020-08-13 ENCOUNTER — Other Ambulatory Visit: Payer: Self-pay

## 2020-08-13 VITALS — BP 109/65 | HR 65 | Temp 98.5°F | Ht 70.0 in | Wt 242.0 lb

## 2020-08-13 DIAGNOSIS — I10 Essential (primary) hypertension: Secondary | ICD-10-CM | POA: Diagnosis not present

## 2020-08-13 DIAGNOSIS — Z6834 Body mass index (BMI) 34.0-34.9, adult: Secondary | ICD-10-CM

## 2020-08-13 DIAGNOSIS — E559 Vitamin D deficiency, unspecified: Secondary | ICD-10-CM | POA: Diagnosis not present

## 2020-08-13 DIAGNOSIS — E669 Obesity, unspecified: Secondary | ICD-10-CM | POA: Diagnosis not present

## 2020-08-13 DIAGNOSIS — Z9189 Other specified personal risk factors, not elsewhere classified: Secondary | ICD-10-CM | POA: Diagnosis not present

## 2020-08-13 MED ORDER — VITAMIN D (ERGOCALCIFEROL) 1.25 MG (50000 UNIT) PO CAPS: 50000.0000 [IU] | ORAL_CAPSULE | ORAL | 0 refills | Status: DC

## 2020-08-13 NOTE — Telephone Encounter (Signed)
Faxed complete/signed Ocrevus start form to [employer] at [phone]. Received fax confirmation. Gave completed start form to [facility] for them to process. Included office visit notes,signed order for [facility], labs, MRI. Sent copy of start form to be scanned to epic.

## 2020-08-14 ENCOUNTER — Ambulatory Visit: Payer: 59 | Admitting: Physical Therapy

## 2020-08-14 ENCOUNTER — Encounter: Payer: Self-pay | Admitting: Physical Therapy

## 2020-08-14 ENCOUNTER — Ambulatory Visit: Payer: 59 | Admitting: Occupational Therapy

## 2020-08-14 ENCOUNTER — Encounter: Payer: Self-pay | Admitting: Occupational Therapy

## 2020-08-14 DIAGNOSIS — R2689 Other abnormalities of gait and mobility: Secondary | ICD-10-CM | POA: Diagnosis not present

## 2020-08-14 DIAGNOSIS — M6281 Muscle weakness (generalized): Secondary | ICD-10-CM

## 2020-08-14 DIAGNOSIS — R2681 Unsteadiness on feet: Secondary | ICD-10-CM

## 2020-08-14 DIAGNOSIS — R278 Other lack of coordination: Secondary | ICD-10-CM

## 2020-08-14 NOTE — Progress Notes (Signed)
Chief Complaint:   OBESITY Cody Rodriguez is here to discuss his progress with his obesity treatment plan along with follow-up of his obesity related diagnoses. Cody Rodriguez is on the Category 3 Plan and states he is following his eating plan approximately 90% of the time. Cody Rodriguez states he is doing PT for 15 minutes 25 times per week.  Today's visit was #: 3 Starting weight: 255 lbs Starting date: 07/12/2020 Today's weight: 242 lbs Today's date: 08/13/2020 Total lbs lost to date: 13 lbs Total lbs lost since last in-office visit: 3 lbs Total weight loss percentage to date: -5.10%  Interim History: Cody Rodriguez says, "I started PT within the last 2 weeks for overall strengthening of lower extremities" since the diagnosis of his MS.  No flare-ups, but still feel unstable and weak.  He is using a cane.  He followed the plan 90% of the time and did not eat the other 10% (skipped meals).  No concerns or issues with the plan.  Cravings and hunger are controlled.  No upcoming travel or challenges.  Assessment/Plan:   1. Vitamin D deficiency Aahan's Vitamin D level was 11.6 on 07/12/2020. He is currently taking prescription vitamin D 50,000 IU each week. He denies nausea, vomiting or muscle weakness.  Tolerating well, without issues.  No change in fatigue or energy levels.  Plan:  Refill vitamin D 50,000 IU weekly, as per below.  Check vitamin D level in 3 months.  -Refill Vitamin D, Ergocalciferol, (DRISDOL) 1.25 MG (50000 UNIT) CAPS capsule; Take 1 capsule (50,000 Units total) by mouth every 7 (seven) days.  Dispense: 4 capsule; Refill: 0  2. Essential hypertension Review: Taking medications as instructed, no medication side effects noted, no chest pain on exertion, no dyspnea on exertion, no swelling of ankles.  He is on Norvasc 5 mg daily.  Denies symptoms or concerns.  Plan:  Blood pressure is at goal.  Continue home blood pressure monitoring, especially while he loses weight.  Keep log and bring in  to each office visit.  BP Readings from Last 3 Encounters:  08/13/20 109/65  08/02/20 135/82  07/26/20 117/74   3. At risk for activity intolerance Cody Rodriguez was given approximately 9 minutes of exercise intolerance counseling today. He is 34 y.o. male and has risk factors exercise intolerance including obesity. We discussed intensive lifestyle modifications today with an emphasis on specific weight loss instructions and strategies. Cody Rodriguez will slowly increase activity as tolerated.  4. Class 1 obesity with serious comorbidity and body mass index (BMI) of 34.0 to 34.9 in adult, unspecified obesity type  Cody Rodriguez is currently in the action stage of change. As such, his goal is to continue with weight loss efforts. He has agreed to the Category 3 Plan.   Exercise goals: As per PT and his neurologist.  Behavioral modification strategies: no skipping meals, meal planning and cooking strategies and planning for success.  Cody Rodriguez has agreed to follow-up with our clinic in 2 weeks. He was informed of the importance of frequent follow-up visits to maximize his success with intensive lifestyle modifications for his multiple health conditions.   Objective:   Blood pressure 109/65, pulse 65, temperature 98.5 F (36.9 C), height 5\' 10"  (1.778 m), weight 242 lb (109.8 kg), SpO2 98 %. Body mass index is 34.72 kg/m.  General: Cooperative, alert, well developed, in no acute distress. HEENT: Conjunctivae and lids unremarkable. Cardiovascular: Regular rhythm.  Lungs: Normal work of breathing. Neurologic: No focal deficits.   Lab Results  Component  Value Date   CREATININE 1.21 07/24/2020   BUN 24 (H) 07/24/2020   NA 137 07/24/2020   K 4.0 07/24/2020   CL 102 07/24/2020   CO2 24 07/24/2020   Lab Results  Component Value Date   ALT 61 (H) 07/24/2020   AST 32 07/24/2020   ALKPHOS 65 07/24/2020   BILITOT 0.6 07/24/2020   Lab Results  Component Value Date   HGBA1C 5.8 (H) 07/12/2020    HGBA1C 5.1 06/26/2014   Lab Results  Component Value Date   INSULIN 26.1 (H) 07/12/2020   Lab Results  Component Value Date   TSH 1.410 07/12/2020   Lab Results  Component Value Date   CHOL 203 (H) 07/12/2020   HDL 38 (L) 07/12/2020   LDLCALC 146 (H) 07/12/2020   TRIG 107 07/12/2020   Lab Results  Component Value Date   WBC 14.1 (H) 07/24/2020   HGB 14.9 07/24/2020   HCT 45.2 07/24/2020   MCV 85.0 07/24/2020   PLT 443 (H) 07/24/2020   Attestation Statements:   Reviewed by clinician on day of visit: allergies, medications, problem list, medical history, surgical history, family history, social history, and previous encounter notes.  I, Insurance claims handler, CMA, am acting as Energy manager for [employer], DO.  I have reviewed the above documentation for accuracy and completeness, and I agree with the above. Carlye Grippe, D.O.  The 21st Century Cures Act was signed into law in 2016 which includes the topic of electronic health records.  This provides immediate access to information in MyChart.  This includes consultation notes, operative notes, office notes, lab results and pathology reports.  If you have any questions about what you read please let us know at your next visit so we can discuss your concerns and take corrective action if need be.  We are right here with you.

## 2020-08-14 NOTE — Patient Instructions (Addendum)
   Coordination Activities  Perform the following activities for 20 minutes 2 times per day with left hand(s).   Rotate ball in fingertips (clockwise and counter-clockwise).  Toss ball between hands.  Toss ball in air and catch with the same hand.  Flip cards 1 at a time as fast as you can.  Deal cards with your thumb (Hold deck in hand and push card off top with thumb).  Pick up coins, buttons, marbles, dried beans/pasta of different sizes and place in container.  Pick up coins and place in container or coin bank.  Pick up coins and stack.  Pick up coins one at a time until you get 5-10 in your hand, then move coins from palm to fingertips to stack one at a time.   ------------------------------------------------------------------------------------------------------------------------------------------   Strengthening: Resisted Flexion   Hold tubing with _left_ arm(s) at side. Pull forward and up. Move shoulder through pain-free range of motion. Repeat __10__ times per set.  Do _1-2_ sessions per day , every other day. Repeat on right.   Strengthening: Resisted Extension   Hold tubing in _left_ hand(s), arm forward. Pull arm back, elbow straight. Repeat _10___ times per set. Do _1-2___ sessions per day, every other day. Repeat on right.   Resisted Horizontal Abduction: Bilateral   Sit or stand, tubing in both hands, arms out in front. Keeping arms straight, pinch shoulder blades together and stretch arms out. Repeat _10___ times per set. Do _1-2___ sessions per day, every other day.   Elbow Flexion: Resisted   With tubing held in _left__ hand(s) and other end secured under foot, curl arm up as far as possible. Repeat _10___ times per set. Do _1-2___ sessions per day, every other day. Repeat on right.       Copyright  VHI. All rights reserved.

## 2020-08-14 NOTE — Therapy (Signed)
[facility] [address] [place], [place], [address] Phone: [phone]   Fax:  [phone]  Physical Therapy Treatment  Patient Details  Name: Bartlett Enke MRN: [id number] Date of Birth: 01-Feb-1986 Referring Provider (PT): Dr. Despina Arias   Encounter Date: 08/14/2020   PT End of Session - 08/14/20 0935    Visit Number 3    Number of Visits 9    Date for PT Re-Evaluation 09/07/20    Authorization Type UHC    Authorization - Number of Visits 35    PT Start Time 0933    PT Stop Time 1015    PT Time Calculation (min) 42 min    Equipment Utilized During Treatment Gait belt    Activity Tolerance Patient tolerated treatment well    Behavior During Therapy [facility] for tasks assessed/performed           Past Medical History:  Diagnosis Date  . Gynecomastia, male 07/02/2014  . High blood pressure   . Low back pain   . Multiple sclerosis (HCC) 07/21/2020  . Paresthesia and pain of both upper extremities    also in right leg  . Rhabdomyolysis 11/24/2019  . Swelling     Past Surgical History:  Procedure Laterality Date  . FINGER SURGERY Left    left pinky  . ROTATOR CUFF REPAIR Left     There were no vitals filed for this visit.   Subjective Assessment - 08/14/20 0933    Subjective No new complaints. No falls or pain to report. Doing the HEP with no issues. To start Ocraves, waiting to hear back from Dr. Bonnita Hollow office on when this infusion will start.    Pertinent History HTN, chronic low back pain, rhabdomyolysis in Feb. 2021, MS    Patient Stated Goals Strengthen LLE and improve balance; return to work    Currently in Pain? No/denies    Pain Score 0-No pain                 [facility] Adult PT Treatment/Exercise - 08/14/20 0936      Transfers   Transfers Sit to Stand;Stand to Sit    Sit to Stand 6: Modified independent (Device/Increase time)    Stand to Sit 6: Modified independent (Device/Increase  time)      Ambulation/Gait   Ambulation/Gait Yes    Ambulation/Gait Assistance 5: Supervision    Assistive device Small based quad cane;None    Gait Pattern Step-through pattern;Decreased stride length;Narrow base of support;Poor foot clearance - left    Ambulation Surface Level;Indoor      Neuro Re-ed    Neuro Re-ed Details  for balance/muscle re-ed: gait around track with speed changes, scanning all directions randomly, then at fast pace scanning while maintaining the pace and pathway. min guard assist for safety. no balance loss noted               Balance Exercises - 08/14/20 0940      Balance Exercises: Standing   SLS with Vectors Foam/compliant surface;Intermittent upper extremity assist;Other reps (comment);Limitations    SLS with Vectors Limitations standing across blue foam beam with 2 tall cones in front: alternating fwd foot taps, then lateral foot taps for 10 reps each.Progressed to alternating forward double foot taps, then alternating cross double foot taps for ~10 reps each. Min guard to min assist for balance. Cues on posture and weight shifting. Touch to bars as needed for balance.     Tandem Gait Forward;Retro;Intermittent upper  extremity support;Foam/compliant surface;3 reps;Limitations    Tandem Gait Limitations on blue foam beam for 3 laps each way with cues on posture and step placement. Touch to bars as needed for balance. Min guard assist for safety.   Sidestepping Foam/compliant support;Upper extremity support;3 reps;Limitations    Sidestepping Limitations on blue foam beam for 3 laps each way with cues on posture and step length/placement. Min guard assist for safety.    Other Standing Exercises on BOSU blue side up: alternating forward step ups with contralateral march for 10 reps each side. Then lateral stepping up/over for 10 reps toward each direction. Light UE support on bars for balance with cues on form/technique; on inverted BOSU (blue side down): rocking  forward/backwards and then laterally for ~10 reps each way. Then holding the BOSU steady with feet apart- EC 30 sec's x 3 reps, progressing to EC head movements left<>right, then up<>down for ~10 reps each. Min guard to min assist for balance.               PT Short Term Goals - 08/07/20 1915      PT SHORT TERM GOAL #1   Title same as LTG's             PT Long Term Goals - 08/07/20 1915      PT LONG TERM GOAL #1   Title Increase FGA score to >/= 27/30 to increase safety and balance with ambulation.    Baseline 20/30 - 08-06-20    Time 4    Period Weeks    Status New    Target Date 09/07/20      PT LONG TERM GOAL #2   Title Increase gait velocity from 3.52 ft/sec to >/= 3.9 ft/sec without device for incr. gait efficiency.    Baseline 9.31 secs = 3.52 ft/sec without device    Time 4    Period Weeks    Status New    Target Date 09/07/20      PT LONG TERM GOAL #3   Title Pt will negotiate 4 steps without use of hand rail using a step over step sequence to demo improved balance.    Time 4    Period Weeks    Status New    Target Date 09/07/20      PT LONG TERM GOAL #4   Title Increase SLS on LLE to >/= 8 secs for increased safety with mobility.    Baseline 4.09 secs    Time 4    Period Weeks    Status New    Target Date 09/07/20      PT LONG TERM GOAL #5   Title Independent in HEP for LLE strengthening and balance exercises.    Time 4    Period Weeks    Status New    Target Date 09/07/20                 Plan - 08/14/20 0936    Clinical Impression Statement Today's skilled session focused on balance reaction training with rest breaks taken as needed. No other issues noted or reported in session. The pt is making steady progress toward goals and should benefit from continued PT to progress toward unmet goals.    Personal Factors and Comorbidities Comorbidity 2;Profession    Comorbidities chronic LBP, rhabdomyolysis (Feb. 2021), HTN, paresthesias LUE & LLE     Examination-Activity Limitations Locomotion Level;Squat;Stairs;Lift    Examination-Participation Restrictions Meal Prep;Cleaning;Community Activity;Laundry;Occupation;Shop    Rehab Potential Good  PT Frequency 2x / week    PT Duration 4 weeks   eval + 2x/week for 4 weeks = 9 visits   PT Next Visit Plan LLE strengthening, high level balance, gait without device    PT Home Exercise Plan Access Code: [email or web address]    Consulted and Agree with Plan of Care Patient;Other (Comment)   fiancee          Patient will benefit from skilled therapeutic intervention in order to improve the following deficits and impairments:  Abnormal gait, Decreased endurance, Decreased activity tolerance, Decreased balance, Decreased strength, Impaired sensation  Visit Diagnosis: Muscle weakness (generalized)  Unsteadiness on feet  Other abnormalities of gait and mobility     Problem List Patient Active Problem List   Diagnosis Date Noted  . High risk medication use 08/02/2020  . Ataxia 08/02/2020  . Essential hypertension 07/26/2020  . Other hyperlipidemia 07/26/2020  . Vitamin D deficiency 07/26/2020  . Prediabetes 07/26/2020  . At risk for activity intolerance 07/26/2020  . Hypertension 07/21/2020  . Multiple sclerosis (HCC) 07/21/2020  . Gait disturbance 07/20/2020  . Thrombocytosis 07/20/2020  . Elevated serum creatinine 07/20/2020  . Hyperglycemia 07/20/2020  . Magnetic resonance imaging of brain abnormal 07/20/2020  . Transaminitis 11/25/2019  . Rhabdomyolysis 11/24/2019  . Paresthesia and pain of both upper extremities   . Current smoker 07/02/2014  . Paresthesias 07/02/2014  . Left-sided weakness 07/02/2014  . Gynecomastia, male 07/02/2014   Sallyanne Kuster, PTA, [facility] [address] [place], [place] [address] [phone] 08/15/20, 11:28 AM   Name: Nahun Kronberg MRN: [id number] Date of Birth: 07-Jul-1986

## 2020-08-14 NOTE — Therapy (Signed)
[facility] [address] [place], [place], [address] Phone: [phone]   Fax:  [phone]  Occupational Therapy Treatment  Patient Details  Name: Cody Rodriguez MRN: [id number] Date of Birth: Feb 24, 1986 Referring Provider (OT): Dr. Despina Arias   Encounter Date: 08/14/2020   OT End of Session - 08/14/20 1024    Visit Number 2    Number of Visits 5    Date for OT Re-Evaluation 09/05/20    Authorization Type UHC    OT Start Time 1017    OT Stop Time 1057    OT Time Calculation (min) 40 min    Activity Tolerance Patient tolerated treatment well    Behavior During Therapy [facility] for tasks assessed/performed           Past Medical History:  Diagnosis Date  . Gynecomastia, male 07/02/2014  . High blood pressure   . Low back pain   . Multiple sclerosis (HCC) 07/21/2020  . Paresthesia and pain of both upper extremities    also in right leg  . Rhabdomyolysis 11/24/2019  . Swelling     Past Surgical History:  Procedure Laterality Date  . FINGER SURGERY Left    left pinky  . ROTATOR CUFF REPAIR Left     There were no vitals filed for this visit.   Subjective Assessment - 08/14/20 1017    Subjective  Pt denies pain. No changes reported since evaluation.    Patient is accompanied by: Family member   fiance [name])   Pertinent History MS recently diagnosed. PMH: HTN, chronic LBP, rhabdomyolysis, gynecomastia    Limitations none    Currently in Pain? No/denies             Treatment:  Hand gripper level 4 with LUE picking up 1 inch blocks   Issued HEP for coordination and theraband exercises for UE - see pt instructions  Small Pegs with LUE with in hand coordination for placing pegs - 5 pegs in hand and translation to palm. Mod difficulty and drops with task. Removed one at a time.                  OT Education - 08/14/20 1024    Education Details coordination HEP. green theraband  exercises - see pt instructions    Person(s) Educated Patient;Spouse    Methods Explanation;Handout    Comprehension Verbalized understanding               OT Long Term Goals - 08/14/20 1035      OT LONG TERM GOAL #1   Title Independent with coordination HEP    Time 4    Period Weeks    Status On-going   HEP coordination     OT LONG TERM GOAL #2   Title Independent with UE strengthening HEP    Time 4    Period Weeks    Status On-going   green theraband     OT LONG TERM GOAL #3   Title Pt to verbalize understanding with energy conservation techniques    Time 4    Period Weeks    Status New      OT LONG TERM GOAL #4   Title Pt to verbalize understanding with MS Society information and support groups    Time 4    Period Weeks    Status Achieved                 Plan - 08/14/20 1018  Clinical Impression Statement Pt progressing towards goals. Issued HEPs this day.    OT Occupational Profile and History Problem Focused Assessment - Including review of records relating to presenting problem    Occupational performance deficits (Please refer to evaluation for details): IADL's;Work;Social Participation    Body Structure / Function / Physical Skills IADL;UE functional use;Strength;Endurance;Balance;Coordination;FMC    Rehab Potential Good    Clinical Decision Making Limited treatment options, no task modification necessary    Comorbidities Affecting Occupational Performance: May have comorbidities impacting occupational performance    Modification or Assistance to Complete Evaluation  No modification of tasks or assist necessary to complete eval    OT Frequency 1x / week    OT Duration 4 weeks   plus eval   OT Treatment/Interventions DME and/or AE instruction;Therapeutic activities;Therapeutic exercise;Coping strategies training;Neuromuscular education;Patient/family education;Energy conservation    Plan review HEP, energy conservation techniques    OT Home  Exercise Plan UE green theraband and coordination HEP    Consulted and Agree with Plan of Care Family member/caregiver;Patient           Patient will benefit from skilled therapeutic intervention in order to improve the following deficits and impairments:   Body Structure / Function / Physical Skills: IADL, UE functional use, Strength, Endurance, Balance, Coordination, [facility]       Visit Diagnosis: Muscle weakness (generalized)  Other lack of coordination    Problem List Patient Active Problem List   Diagnosis Date Noted  . High risk medication use 08/02/2020  . Ataxia 08/02/2020  . Essential hypertension 07/26/2020  . Other hyperlipidemia 07/26/2020  . Vitamin D deficiency 07/26/2020  . Prediabetes 07/26/2020  . At risk for activity intolerance 07/26/2020  . Hypertension 07/21/2020  . Multiple sclerosis (HCC) 07/21/2020  . Gait disturbance 07/20/2020  . Thrombocytosis 07/20/2020  . Elevated serum creatinine 07/20/2020  . Hyperglycemia 07/20/2020  . Magnetic resonance imaging of brain abnormal 07/20/2020  . Transaminitis 11/25/2019  . Rhabdomyolysis 11/24/2019  . Paresthesia and pain of both upper extremities   . Current smoker 07/02/2014  . Paresthesias 07/02/2014  . Left-sided weakness 07/02/2014  . Gynecomastia, male 07/02/2014    Junious Dresser MOT, OTR/L  08/14/2020, 1:22 PM  [facility] [address] [place], [place], [address] Phone: [phone]   Fax:  [phone]  Name: Shady Bradish MRN: [id number] Date of Birth: 1985-12-12

## 2020-08-16 ENCOUNTER — Encounter: Payer: Self-pay | Admitting: Physical Therapy

## 2020-08-16 ENCOUNTER — Other Ambulatory Visit: Payer: Self-pay

## 2020-08-16 ENCOUNTER — Ambulatory Visit: Payer: 59 | Admitting: Physical Therapy

## 2020-08-16 DIAGNOSIS — M6281 Muscle weakness (generalized): Secondary | ICD-10-CM

## 2020-08-16 DIAGNOSIS — R2689 Other abnormalities of gait and mobility: Secondary | ICD-10-CM | POA: Diagnosis not present

## 2020-08-16 DIAGNOSIS — R2681 Unsteadiness on feet: Secondary | ICD-10-CM

## 2020-08-17 NOTE — Therapy (Signed)
[facility] [address] [place], [place], [address] Phone: [phone]   Fax:  [phone]  Physical Therapy Treatment  Patient Details  Name: Cody Rodriguez MRN: [id number] Date of Birth: 05/18/1986 Referring Provider (PT): Dr. Despina Arias   Encounter Date: 08/16/2020   PT End of Session - 08/17/20 1418    Visit Number 4    Number of Visits 9    Date for PT Re-Evaluation 09/07/20    Authorization Type UHC    Authorization - Number of Visits 35    PT Start Time 0931    PT Stop Time 1015    PT Time Calculation (min) 44 min    Equipment Utilized During Treatment --    Activity Tolerance Patient tolerated treatment well    Behavior During Therapy [facility] for tasks assessed/performed           Past Medical History:  Diagnosis Date  . Gynecomastia, male 07/02/2014  . High blood pressure   . Low back pain   . Multiple sclerosis (HCC) 07/21/2020  . Paresthesia and pain of both upper extremities    also in right leg  . Rhabdomyolysis 11/24/2019  . Swelling     Past Surgical History:  Procedure Laterality Date  . FINGER SURGERY Left    left pinky  . ROTATOR CUFF REPAIR Left     There were no vitals filed for this visit.   Subjective Assessment - 08/16/20 0933    Subjective Pt states he was tired after last session - went home and rested    Pertinent History HTN, chronic low back pain, rhabdomyolysis in Feb. 2021, MS    Patient Stated Goals Strengthen LLE and improve balance; return to work    Currently in Pain? No/denies                             [facility] Adult PT Treatment/Exercise - 08/17/20 0001      Exercises   Exercises Knee/Hip;Ankle      Knee/Hip Exercises: Machines for Strengthening   Cybex Leg Press bil. LE's 100# 10 reps;  LLE 65# 2 sets 10 reps      Knee/Hip Exercises: Standing   Forward Step Up Left;1 set;10 reps;Hand Hold: 1;Step Height: 6"    Step Down Left;1 set;10  reps;Hand Hold: 2;Step Height: 6"    Other Standing Knee Exercises resisted ambulation with red theraband 35' forward x 2 reps, sidestepping 2 reps, and backwards amb. for hip extensor strengthening 2 reps      Ankle Exercises: Stretches   Gastroc Stretch 1 rep;30 seconds   LLE   Other Stretch Runner's stretch LLE       Ankle Exercises: Standing   Heel Raises Left;10 reps;2 seconds    Other Standing Ankle Exercises pt performed Lt unilateral heel lifts - closed chain position off side of hi/lo mat table 3 sets 10 reps for Lt gastroc strengthening          Pt performed Lt dorsiflexion with red theraband in seated position 10 reps with 3 sec hold; then in standing 10 reps 3 sec hold        PT Education - 08/17/20 1415    Education Details pt instructed in knee extension control exercise with green band - bil. stance, forward and backward stance    Person(s) Educated Patient    Methods Explanation;Demonstration;Handout    Comprehension Verbalized understanding;Returned demonstration  PT Short Term Goals - 08/07/20 1915      PT SHORT TERM GOAL #1   Title same as LTG's             PT Long Term Goals - 08/17/20 1421      PT LONG TERM GOAL #1   Title Increase FGA score to >/= 27/30 to increase safety and balance with ambulation.    Baseline 20/30 - 08-06-20    Time 4    Period Weeks    Status New      PT LONG TERM GOAL #2   Title Increase gait velocity from 3.52 ft/sec to >/= 3.9 ft/sec without device for incr. gait efficiency.    Baseline 9.31 secs = 3.52 ft/sec without device    Time 4    Period Weeks    Status New      PT LONG TERM GOAL #3   Title Pt will negotiate 4 steps without use of hand rail using a step over step sequence to demo improved balance.    Time 4    Period Weeks    Status New      PT LONG TERM GOAL #4   Title Increase SLS on LLE to >/= 8 secs for increased safety with mobility.    Baseline 4.09 secs    Time 4    Period Weeks      Status New      PT LONG TERM GOAL #5   Title Independent in HEP for LLE strengthening and balance exercises.    Time 4    Period Weeks    Status New                 Plan - 08/16/20 0934    Clinical Impression Statement Pt has decreased Lt knee control with occasional hyperextension that increases with fatigue;  pt tolerated knee extension control exercises well with use of green theraband for resistance.  Pt also presents with decreased eccentric quad strength in LLE.  Cont with POC.    Personal Factors and Comorbidities Comorbidity 2;Profession    Comorbidities chronic LBP, rhabdomyolysis (Feb. 2021), HTN, paresthesias LUE & LLE    Examination-Activity Limitations Locomotion Level;Squat;Stairs;Lift    Examination-Participation Restrictions Meal Prep;Cleaning;Community Activity;Laundry;Occupation;Shop    Rehab Potential Good    PT Frequency 2x / week    PT Duration 4 weeks   eval + 2x/week for 4 weeks = 9 visits   PT Next Visit Plan LLE strengthening, high level balance, gait without device    PT Home Exercise Plan Access Code: [email or web address]    Consulted and Agree with Plan of Care Patient;Other (Comment)   fiancee          Patient will benefit from skilled therapeutic intervention in order to improve the following deficits and impairments:  Abnormal gait, Decreased endurance, Decreased activity tolerance, Decreased balance, Decreased strength, Impaired sensation  Visit Diagnosis: Muscle weakness (generalized)  Unsteadiness on feet     Problem List Patient Active Problem List   Diagnosis Date Noted  . High risk medication use 08/02/2020  . Ataxia 08/02/2020  . Essential hypertension 07/26/2020  . Other hyperlipidemia 07/26/2020  . Vitamin D deficiency 07/26/2020  . Prediabetes 07/26/2020  . At risk for activity intolerance 07/26/2020  . Hypertension 07/21/2020  . Multiple sclerosis (HCC) 07/21/2020  . Gait disturbance 07/20/2020  . Thrombocytosis 07/20/2020  .  Elevated serum creatinine 07/20/2020  . Hyperglycemia 07/20/2020  . Magnetic resonance imaging of brain abnormal 07/20/2020  .  Transaminitis 11/25/2019  . Rhabdomyolysis 11/24/2019  . Paresthesia and pain of both upper extremities   . Current smoker 07/02/2014  . Paresthesias 07/02/2014  . Left-sided weakness 07/02/2014  . Gynecomastia, male 07/02/2014    Kary Kos, PT 08/17/2020, 2:24 PM  [facility] [address] [place], [place], [address] Phone: [phone]   Fax:  [phone]  Name: Cody Rodriguez MRN: [id number] Date of Birth: 1986-06-03

## 2020-08-21 ENCOUNTER — Encounter: Payer: Self-pay | Admitting: Occupational Therapy

## 2020-08-21 ENCOUNTER — Ambulatory Visit: Payer: 59 | Admitting: Occupational Therapy

## 2020-08-21 ENCOUNTER — Ambulatory Visit: Payer: 59 | Admitting: Physical Therapy

## 2020-08-21 ENCOUNTER — Other Ambulatory Visit: Payer: Self-pay

## 2020-08-21 ENCOUNTER — Encounter: Payer: Self-pay | Admitting: Physical Therapy

## 2020-08-21 DIAGNOSIS — R2681 Unsteadiness on feet: Secondary | ICD-10-CM

## 2020-08-21 DIAGNOSIS — R2689 Other abnormalities of gait and mobility: Secondary | ICD-10-CM | POA: Diagnosis not present

## 2020-08-21 DIAGNOSIS — M6281 Muscle weakness (generalized): Secondary | ICD-10-CM

## 2020-08-21 DIAGNOSIS — R278 Other lack of coordination: Secondary | ICD-10-CM

## 2020-08-21 NOTE — Patient Instructions (Addendum)

## 2020-08-21 NOTE — Therapy (Signed)
[facility] [address] [place], [place], [address] Phone: [phone]   Fax:  [phone]  Occupational Therapy Treatment & Discharge  Patient Details  Name: Cody Rodriguez MRN: [id number] Date of Birth: [birth year] Referring Provider (OT): Dr. Arlice Colt   Encounter Date: 08/21/2020   OT End of Session - 08/21/20 1018    Visit Number 3    Number of Visits 5    Date for OT Re-Evaluation 09/05/20    Authorization Type UHC    OT Start Time 1018    OT Stop Time 1056    OT Time Calculation (min) 38 min    Activity Tolerance Patient tolerated treatment well    Behavior During Therapy [facility] for tasks assessed/performed           Past Medical History:  Diagnosis Date  . Gynecomastia, male 07/02/2014  . High blood pressure   . Low back pain   . Multiple sclerosis ([place]) 07/21/2020  . Paresthesia and pain of both upper extremities    also in right leg  . Rhabdomyolysis 11/24/2019  . Swelling     Past Surgical History:  Procedure Laterality Date  . FINGER SURGERY Left    left pinky  . ROTATOR CUFF REPAIR Left     There were no vitals filed for this visit.   Subjective Assessment - 08/21/20 1019    Subjective  Pt denies pain. No changes reported since last session.    Patient is accompanied by: Family member   fiance Perrin Smack)   Pertinent History MS recently diagnosed. PMH: HTN, chronic LBP, rhabdomyolysis, gynecomastia    Limitations none    Currently in Pain? No/denies               OCCUPATIONAL THERAPY DISCHARGE SUMMARY  Visits from Start of Care: 3  Current functional level related to goals / functional outcomes: See Clinical impression statement. Pt made good progress and verbalized understanding of energy conservation technique and strategies. Pt is independent with HEP for coordination and with theraband for UE strengthening.    Remaining deficits: MS related fatigue and weakness     Education / Equipment: HEP  Plan: Patient agrees to discharge.  Patient goals were met. Patient is being discharged due to meeting the stated rehab goals.  ?????               OT Treatments/Exercises (OP) - 08/21/20 1036      ADLs   ADL Comments Pt reports no difficulty with ADLs and IADLs at this time      Exercises   Exercises Shoulder;Elbow;Hand      Shoulder Exercises: Seated   Retraction Strengthening;Both;10 reps;Weights    Retraction Weight (lbs) 5    Flexion Strengthening;Both;10 reps;Weights    Flexion Weight (lbs) 5    Other Seated Exercises Strengthening Circumduction 10 reps both 5 lbs      Elbow Exercises   Bar Weights/Barbell (Elbow Flexion) 5 lbs    Elbow Flexion Limitations 10 reps    Forearm Supination Strengthening;Both;10 reps;Seated;Bar weights/barbell    Bar Weights/Barbell (Forearm Supination) 5 lbs    Forearm Pronation Strengthening;Both;10 reps;Seated;Bar weights/barbell    Bar Weights/Barbell (Forearm Pronation) 5 lbs      Additional Elbow Exercises   UBE (Upper Arm Bike) 10 minutes - 5 forward/5 backward level 4      Fine Motor Coordination (Hand/Wrist)   Fine Motor Coordination Small Pegboard    Small Pegboard LUE no drops and increased time  OT Education - 08/21/20 1027    Education Details Reviewed energy conservation techqniues and strategies. See pt instructions    Person(s) Educated Patient    Methods Explanation;Handout    Comprehension Verbalized understanding               OT Long Term Goals - 08/21/20 1027      OT LONG TERM GOAL #1   Title Independent with coordination HEP    Time 4    Period Weeks    Status Achieved   HEP coordination     OT LONG TERM GOAL #2   Title Independent with UE strengthening HEP    Time 4    Period Weeks    Status Achieved   green theraband     OT LONG TERM GOAL #3   Title Pt to verbalize understanding with energy conservation techniques    Time 4     Period Weeks    Status Achieved      OT LONG TERM GOAL #4   Title Pt to verbalize understanding with MS Society information and support groups    Time 4    Period Weeks    Status Achieved                 Plan - 08/21/20 1041    Clinical Impression Statement Pt has met all goals at this time and is ready for discharge. Pt has verbalized understanding of energy conservation strategies and techniques and is agreeable ot implementing them. Pt has received and is independent with HEP for strengthening and coordination at this time. Pt is agreeable to discharge.    OT Occupational Profile and History Problem Focused Assessment - Including review of records relating to presenting problem    Occupational performance deficits (Please refer to evaluation for details): IADL's;Work;Social Participation    Body Structure / Function / Physical Skills IADL;UE functional use;Strength;Endurance;Balance;Coordination;FMC    Rehab Potential Good    Clinical Decision Making Limited treatment options, no task modification necessary    Comorbidities Affecting Occupational Performance: May have comorbidities impacting occupational performance    Modification or Assistance to Complete Evaluation  No modification of tasks or assist necessary to complete eval    OT Frequency 1x / week    OT Duration 4 weeks   plus eval   OT Treatment/Interventions DME and/or AE instruction;Therapeutic activities;Therapeutic exercise;Coping strategies training;Neuromuscular education;Patient/family education;Energy conservation    Plan discharge    OT Home Exercise Plan UE green theraband and coordination HEP    Consulted and Agree with Plan of Care Family member/caregiver;Patient           Patient will benefit from skilled therapeutic intervention in order to improve the following deficits and impairments:   Body Structure / Function / Physical Skills: IADL, UE functional use, Strength, Endurance, Balance, Coordination,  [facility]       Visit Diagnosis: Muscle weakness (generalized)  Other lack of coordination    Problem List Patient Active Problem List   Diagnosis Date Noted  . High risk medication use 08/02/2020  . Ataxia 08/02/2020  . Essential hypertension 07/26/2020  . Other hyperlipidemia 07/26/2020  . Vitamin D deficiency 07/26/2020  . Prediabetes 07/26/2020  . At risk for activity intolerance 07/26/2020  . Hypertension 07/21/2020  . Multiple sclerosis ([place]) 07/21/2020  . Gait disturbance 07/20/2020  . Thrombocytosis 07/20/2020  . Elevated serum creatinine 07/20/2020  . Hyperglycemia 07/20/2020  . Magnetic resonance imaging of brain abnormal 07/20/2020  . Transaminitis 11/25/2019  .  Rhabdomyolysis 11/24/2019  . Paresthesia and pain of both upper extremities   . Current smoker 07/02/2014  . Paresthesias 07/02/2014  . Left-sided weakness 07/02/2014  . Gynecomastia, male 07/02/2014    Zachery Conch  MOT, OTR/L  08/21/2020, 12:46 PM  [facility] [address] [place], [place], [address] Phone: [phone]   Fax:  [phone]  Name: Irbin Fines MRN: [id number] Date of Birth: Nov 18, 1985

## 2020-08-22 NOTE — Therapy (Signed)
[facility] [address] [place], [place], [address] Phone: [phone]   Fax:  [phone]  Physical Therapy Treatment  Patient Details  Name: Cody Rodriguez MRN: [id number] Date of Birth: 06-04-1986 Referring Provider (PT): Dr. Despina Arias   Encounter Date: 08/21/2020   PT End of Session - 08/21/20 0939    Visit Number 5    Number of Visits 9    Date for PT Re-Evaluation 09/07/20    Authorization Type UHC    Authorization - Number of Visits 35    PT Start Time 0935    PT Stop Time 1015    PT Time Calculation (min) 40 min    Equipment Utilized During Treatment Gait belt    Activity Tolerance Patient tolerated treatment well    Behavior During Therapy [facility] for tasks assessed/performed           Past Medical History:  Diagnosis Date  . Gynecomastia, male 07/02/2014  . High blood pressure   . Low back pain   . Multiple sclerosis ([facility]) 07/21/2020  . Paresthesia and pain of both upper extremities    also in right leg  . Rhabdomyolysis 11/24/2019  . Swelling     Past Surgical History:  Procedure Laterality Date  . FINGER SURGERY Left    left pinky  . ROTATOR CUFF REPAIR Left     There were no vitals filed for this visit.   Subjective Assessment - 08/21/20 0938    Subjective "she got me too". Was tired after last session. No pain or falls to report. Has heard from office on infusion however has not been scheduled for an appointment as yet.    Pertinent History HTN, chronic low back pain, rhabdomyolysis in Feb. 2021, MS    Patient Stated Goals Strengthen LLE and improve balance; return to work    Currently in Pain? No/denies    Pain Score 0-No pain                  [facility] Adult PT Treatment/Exercise - 08/21/20 0939      Transfers   Transfers Sit to Stand;Stand to Sit    Sit to Stand 6: Modified independent (Device/Increase time)    Stand to Sit 6: Modified independent (Device/Increase time)       Ambulation/Gait   Ambulation/Gait Yes    Ambulation/Gait Assistance 5: Supervision    Ambulation/Gait Assistance Details around gym with session    Assistive device None    Gait Pattern Step-through pattern;Decreased stride length;Narrow base of support;Poor foot clearance - left    Ambulation Surface Level;Indoor      High Level Balance   High Level Balance Activities Side stepping;Marching forwards;Marching backwards;Tandem walking   side stepping in squat position; tandem fwd/bwd   High Level Balance Comments on blue mat for 4 laps each/each way with cues on technique/form and intermittent touch to the bars for balance. Min guard assist for safety.      Neuro Re-ed    Neuro Re-ed Details  for balance/muscle re-ed/strengthening: seated at edge of mat with feet on airex- sit<>stands with OH press using 2# weighted ball for 2 sets of 10 reps;       Knee/Hip Exercises: Aerobic   Elliptical level 1.0 for 1 minute each fwd/bwd with bil UE support with min guard assist for safety, cues on posture.       Knee/Hip Exercises: Standing   Lateral Step Up Both;1 set;10 reps;Hand Hold: 2;Step Height: 6";Limitations. Cues  for form and technique. Contralateral march with non stance leg.    Forward Step Up Both;1 set;10 reps;Hand Hold: 2;Step Height: 6";Limitations. Cues for form and technique. Lifting non stance leg up into the air/back down slowly.    Other Standing Knee Exercises green theraband 4 way hip kicks- for 10 reps each direction with bil LE's. UE support for balance.                 PT Short Term Goals - 08/07/20 1915      PT SHORT TERM GOAL #1   Title same as LTG's             PT Long Term Goals - 08/17/20 1421      PT LONG TERM GOAL #1   Title Increase FGA score to >/= 27/30 to increase safety and balance with ambulation.    Baseline 20/30 - 08-06-20    Time 4    Period Weeks    Status New      PT LONG TERM GOAL #2   Title Increase gait velocity from 3.52  ft/sec to >/= 3.9 ft/sec without device for incr. gait efficiency.    Baseline 9.31 secs = 3.52 ft/sec without device    Time 4    Period Weeks    Status New      PT LONG TERM GOAL #3   Title Pt will negotiate 4 steps without use of hand rail using a step over step sequence to demo improved balance.    Time 4    Period Weeks    Status New      PT LONG TERM GOAL #4   Title Increase SLS on LLE to >/= 8 secs for increased safety with mobility.    Baseline 4.09 secs    Time 4    Period Weeks    Status New      PT LONG TERM GOAL #5   Title Independent in HEP for LLE strengthening and balance exercises.    Time 4    Period Weeks    Status New                 Plan - 08/21/20 0939    Clinical Impression Statement Today's skilled session continued to focus on strengthening and balance reactions with rest breaks taken as needed. No other issues noted or reported in session. The patient is progressing toward goals and should benefit from continued PT to progress toward unmet goals .   Personal Factors and Comorbidities Comorbidity 2;Profession    Comorbidities chronic LBP, rhabdomyolysis (Feb. 2021), HTN, paresthesias LUE & LLE    Examination-Activity Limitations Locomotion Level;Squat;Stairs;Lift    Examination-Participation Restrictions Meal Prep;Cleaning;Community Activity;Laundry;Occupation;Shop    Rehab Potential Good    PT Frequency 2x / week    PT Duration 4 weeks   eval + 2x/week for 4 weeks = 9 visits   PT Next Visit Plan LLE strengthening, high level balance, gait without device    PT Home Exercise Plan Access Code: [email or web address]    Consulted and Agree with Plan of Care Patient;Other (Comment)   fiancee          Patient will benefit from skilled therapeutic intervention in order to improve the following deficits and impairments:  Abnormal gait, Decreased endurance, Decreased activity tolerance, Decreased balance, Decreased strength, Impaired sensation  Visit  Diagnosis: Muscle weakness (generalized)  Unsteadiness on feet  Other abnormalities of gait and mobility     Problem List Patient Active Problem  List   Diagnosis Date Noted  . High risk medication use 08/02/2020  . Ataxia 08/02/2020  . Essential hypertension 07/26/2020  . Other hyperlipidemia 07/26/2020  . Vitamin D deficiency 07/26/2020  . Prediabetes 07/26/2020  . At risk for activity intolerance 07/26/2020  . Hypertension 07/21/2020  . Multiple sclerosis ([facility]) 07/21/2020  . Gait disturbance 07/20/2020  . Thrombocytosis 07/20/2020  . Elevated serum creatinine 07/20/2020  . Hyperglycemia 07/20/2020  . Magnetic resonance imaging of brain abnormal 07/20/2020  . Transaminitis 11/25/2019  . Rhabdomyolysis 11/24/2019  . Paresthesia and pain of both upper extremities   . Current smoker 07/02/2014  . Paresthesias 07/02/2014  . Left-sided weakness 07/02/2014  . Gynecomastia, male 07/02/2014    Sallyanne Kuster, PTA, [facility] Outpatient Neuro [facility] [address] [place], [place] [address] [phone] 08/22/20, 10:04 PM   Name: Cody Rodriguez MRN: [id number] Date of Birth: August 07, 1986

## 2020-08-23 ENCOUNTER — Ambulatory Visit: Payer: 59 | Admitting: Physical Therapy

## 2020-08-23 ENCOUNTER — Other Ambulatory Visit: Payer: Self-pay

## 2020-08-23 ENCOUNTER — Encounter: Payer: Self-pay | Admitting: Physical Therapy

## 2020-08-23 DIAGNOSIS — R278 Other lack of coordination: Secondary | ICD-10-CM

## 2020-08-23 DIAGNOSIS — R2681 Unsteadiness on feet: Secondary | ICD-10-CM

## 2020-08-23 DIAGNOSIS — R2689 Other abnormalities of gait and mobility: Secondary | ICD-10-CM | POA: Diagnosis not present

## 2020-08-23 DIAGNOSIS — M6281 Muscle weakness (generalized): Secondary | ICD-10-CM

## 2020-08-24 NOTE — Therapy (Signed)
[facility] [address] [place], [place], [address] Phone: [phone]   Fax:  [phone]  Physical Therapy Treatment  Patient Details  Name: Cody Rodriguez MRN: [id number] Date of Birth: 01/23/1986 Referring Provider (PT): Dr. Despina Arias   Encounter Date: 08/23/2020   PT End of Session - 08/23/20 0935    Visit Number 6    Number of Visits 9    Date for PT Re-Evaluation 09/07/20    Authorization Type UHC    Authorization - Number of Visits 35    PT Start Time 0933    PT Stop Time 1012    PT Time Calculation (min) 39 min    Equipment Utilized During Treatment Gait belt    Activity Tolerance Patient tolerated treatment well    Behavior During Therapy [facility] for tasks assessed/performed           Past Medical History:  Diagnosis Date  . Gynecomastia, male 07/02/2014  . High blood pressure   . Low back pain   . Multiple sclerosis ([facility]) 07/21/2020  . Paresthesia and pain of both upper extremities    also in right leg  . Rhabdomyolysis 11/24/2019  . Swelling     Past Surgical History:  Procedure Laterality Date  . FINGER SURGERY Left    left pinky  . ROTATOR CUFF REPAIR Left     There were no vitals filed for this visit.   Subjective Assessment - 08/23/20 0934    Subjective No new complaints. No falls. Now scheduled for his first infusion on Nov 29th and Dec 13th for the second one. Felt okay after last session, "I still had energy".    Pertinent History HTN, chronic low back pain, rhabdomyolysis in Feb. 2021, MS    Patient Stated Goals Strengthen LLE and improve balance; return to work    Currently in Pain? No/denies    Pain Score 0-No pain                 [facility] Adult PT Treatment/Exercise - 08/23/20 0936      Transfers   Transfers Sit to Stand;Stand to Sit    Sit to Stand 6: Modified independent (Device/Increase time)    Stand to Sit 6: Modified independent (Device/Increase time)       Ambulation/Gait   Ambulation/Gait Yes    Ambulation/Gait Assistance 5: Supervision    Ambulation/Gait Assistance Details around gym with session    Assistive device None    Gait Pattern Step-through pattern;Decreased stride length;Narrow base of support;Poor foot clearance - left    Ambulation Surface Level;Indoor      Therapeutic Activites    Therapeutic Activities Lifting    Lifting 30# crate- lifting from<>to floor for 2 sets of 10 reps, pt with good form after demo prior to pt performance.  then lifting 20# crate to carry for 120 feet with supervision, no balance issues noted.       Knee/Hip Exercises: Aerobic   Elliptical level 1.0 for 1 minute each fwd/bwd with bil UE support.               Balance Exercises - 08/23/20 0948      Balance Exercises: Standing   Standing Eyes Closed Narrow base of support (BOS);Wide (BOA);Head turns;Foam/compliant surface;Other reps (comment);30 secs;Limitations    Standing Eyes Closed Limitations on open dense blue foam: feet together for EC 30 sec's x 3 reps, then feet hip width apart for EC head movements left<>right, up<>down for ~10 reps  each with up to min assist for balance at times. no UE support.     Other Standing Exercises on BOSU blue side up:  alternating fwd step ups with contralateral march for 10 each side, then lateral stepping up and over for 10 reps each way with light UE support on bars as needed;  on inverted BOSU (blue side down): rocking fwd/bwd, then laterally for 10 reps each, then minisquats for 10 reps with light UE support as needed.  then single leg stance in center for contralateral kicks fwd/lateral/bwd for 2 sets of 5 reps on each leg. light UE suport on bars needed.                PT Short Term Goals - 08/07/20 1915      PT SHORT TERM GOAL #1   Title same as LTG's             PT Long Term Goals - 08/17/20 1421      PT LONG TERM GOAL #1   Title Increase FGA score to >/= 27/30 to increase safety and  balance with ambulation.    Baseline 20/30 - 08-06-20    Time 4    Period Weeks    Status New      PT LONG TERM GOAL #2   Title Increase gait velocity from 3.52 ft/sec to >/= 3.9 ft/sec without device for incr. gait efficiency.    Baseline 9.31 secs = 3.52 ft/sec without device    Time 4    Period Weeks    Status New      PT LONG TERM GOAL #3   Title Pt will negotiate 4 steps without use of hand rail using a step over step sequence to demo improved balance.    Time 4    Period Weeks    Status New      PT LONG TERM GOAL #4   Title Increase SLS on LLE to >/= 8 secs for increased safety with mobility.    Baseline 4.09 secs    Time 4    Period Weeks    Status New      PT LONG TERM GOAL #5   Title Independent in HEP for LLE strengthening and balance exercises.    Time 4    Period Weeks    Status New                 Plan - 08/23/20 0935    Clinical Impression Statement Today's skilled session continued to focus on strengthening and balance. A few rest breaks needed pt reporting feeling "good" at end of session. The pt is progressing toward goals and should benefit from continued PT to progress toward unmet goals.    Personal Factors and Comorbidities Comorbidity 2;Profession    Comorbidities chronic LBP, rhabdomyolysis (Feb. 2021), HTN, paresthesias LUE & LLE    Examination-Activity Limitations Locomotion Level;Squat;Stairs;Lift    Examination-Participation Restrictions Meal Prep;Cleaning;Community Activity;Laundry;Occupation;Shop    Rehab Potential Good    PT Frequency 2x / week    PT Duration 4 weeks   eval + 2x/week for 4 weeks = 9 visits   PT Next Visit Plan LLE strengthening, high level balance, gait without device    PT Home Exercise Plan Access Code: [email or web address]    Consulted and Agree with Plan of Care Patient;Other (Comment)   fiancee          Patient will benefit from skilled therapeutic intervention in order to improve the following deficits and  impairments:   Abnormal gait, Decreased endurance, Decreased activity tolerance, Decreased balance, Decreased strength, Impaired sensation  Visit Diagnosis: Muscle weakness (generalized)  Unsteadiness on feet  Other abnormalities of gait and mobility  Other lack of coordination     Problem List Patient Active Problem List   Diagnosis Date Noted  . High risk medication use 08/02/2020  . Ataxia 08/02/2020  . Essential hypertension 07/26/2020  . Other hyperlipidemia 07/26/2020  . Vitamin D deficiency 07/26/2020  . Prediabetes 07/26/2020  . At risk for activity intolerance 07/26/2020  . Hypertension 07/21/2020  . Multiple sclerosis ([facility]) 07/21/2020  . Gait disturbance 07/20/2020  . Thrombocytosis 07/20/2020  . Elevated serum creatinine 07/20/2020  . Hyperglycemia 07/20/2020  . Magnetic resonance imaging of brain abnormal 07/20/2020  . Transaminitis 11/25/2019  . Rhabdomyolysis 11/24/2019  . Paresthesia and pain of both upper extremities   . Current smoker 07/02/2014  . Paresthesias 07/02/2014  . Left-sided weakness 07/02/2014  . Gynecomastia, male 07/02/2014    Sallyanne Kuster, PTA, [facility] Outpatient Neuro [facility] [address] [place], [place] [address] [phone] 08/24/20, 5:05 PM   Name: Cody Rodriguez MRN: [id number] Date of Birth: July 30, 1986

## 2020-08-27 ENCOUNTER — Ambulatory Visit: Payer: 59 | Admitting: Physical Therapy

## 2020-08-27 ENCOUNTER — Ambulatory Visit: Payer: 59 | Admitting: Occupational Therapy

## 2020-08-27 ENCOUNTER — Other Ambulatory Visit: Payer: Self-pay

## 2020-08-27 DIAGNOSIS — R2681 Unsteadiness on feet: Secondary | ICD-10-CM

## 2020-08-27 DIAGNOSIS — M6281 Muscle weakness (generalized): Secondary | ICD-10-CM

## 2020-08-27 DIAGNOSIS — R2689 Other abnormalities of gait and mobility: Secondary | ICD-10-CM | POA: Diagnosis not present

## 2020-08-28 ENCOUNTER — Encounter (INDEPENDENT_AMBULATORY_CARE_PROVIDER_SITE_OTHER): Payer: Self-pay | Admitting: Family Medicine

## 2020-08-28 ENCOUNTER — Encounter: Payer: Self-pay | Admitting: Physical Therapy

## 2020-08-28 ENCOUNTER — Ambulatory Visit (INDEPENDENT_AMBULATORY_CARE_PROVIDER_SITE_OTHER): Payer: 59 | Admitting: Family Medicine

## 2020-08-28 VITALS — BP 120/76 | HR 84 | Temp 98.4°F | Ht 70.0 in | Wt 241.0 lb

## 2020-08-28 DIAGNOSIS — E559 Vitamin D deficiency, unspecified: Secondary | ICD-10-CM | POA: Diagnosis not present

## 2020-08-28 DIAGNOSIS — F4320 Adjustment disorder, unspecified: Secondary | ICD-10-CM | POA: Insufficient documentation

## 2020-08-28 DIAGNOSIS — Z6834 Body mass index (BMI) 34.0-34.9, adult: Secondary | ICD-10-CM

## 2020-08-28 DIAGNOSIS — Z9189 Other specified personal risk factors, not elsewhere classified: Secondary | ICD-10-CM

## 2020-08-28 DIAGNOSIS — E669 Obesity, unspecified: Secondary | ICD-10-CM

## 2020-08-28 DIAGNOSIS — I10 Essential (primary) hypertension: Secondary | ICD-10-CM

## 2020-08-28 NOTE — Therapy (Signed)
[facility] [address] [place], [place], [address] Phone: [phone]   Fax:  [phone]  Physical Therapy Treatment & Discharge Summary   Patient Details  Name: Cody Rodriguez MRN: [id number] Date of Birth: 04-08-1986 Referring Provider (PT): Dr. Arlice Colt   Encounter Date: 08/27/2020   PT End of Session - 08/28/20 2129    Visit Number 7    Number of Visits 9    Date for PT Re-Evaluation 09/07/20    Authorization Type UHC    Authorization - Number of Visits 35    PT Start Time 0930    PT Stop Time 1015    PT Time Calculation (min) 45 min    Equipment Utilized During Treatment Gait belt    Activity Tolerance Patient tolerated treatment well    Behavior During Therapy [facility] for tasks assessed/performed           Past Medical History:  Diagnosis Date  . Gynecomastia, male 07/02/2014  . High blood pressure   . Low back pain   . Multiple sclerosis ([place]) 07/21/2020  . Paresthesia and pain of both upper extremities    also in right leg  . Rhabdomyolysis 11/24/2019  . Swelling     Past Surgical History:  Procedure Laterality Date  . FINGER SURGERY Left    left pinky  . ROTATOR CUFF REPAIR Left     There were no vitals filed for this visit.       [facility] PT Assessment - 08/28/20 0001      Functional Gait  Assessment   Gait assessed  Yes    Gait Level Surface Walks 20 ft in less than 5.5 sec, no assistive devices, good speed, no evidence for imbalance, normal gait pattern, deviates no more than 6 in outside of the 12 in walkway width.   8.37   Change in Gait Speed Able to smoothly change walking speed without loss of balance or gait deviation. Deviate no more than 6 in outside of the 12 in walkway width.   5.5   Gait with Horizontal Head Turns Performs head turns smoothly with no change in gait. Deviates no more than 6 in outside 12 in walkway width    Gait with Vertical Head Turns Performs head turns  with no change in gait. Deviates no more than 6 in outside 12 in walkway width.    Gait and Pivot Turn Pivot turns safely within 3 sec and stops quickly with no loss of balance.    Step Over Obstacle Is able to step over 2 stacked shoe boxes taped together (9 in total height) without changing gait speed. No evidence of imbalance.    Gait with Narrow Base of Support Ambulates 7-9 steps.   2 steps   Gait with Eyes Closed Walks 20 ft, no assistive devices, good speed, no evidence of imbalance, normal gait pattern, deviates no more than 6 in outside 12 in walkway width. Ambulates 20 ft in less than 7 sec.    Ambulating Backwards Walks 20 ft, no assistive devices, good speed, no evidence for imbalance, normal gait    Steps Alternating feet, no rail.    Total Score 29                  Pt amb. Approx. 30' forwards/backwards on tip toes with knees flexed for Lt plantarflexor strengthening Amb. Sideways 30' x 2 reps on tiptoes with knees flexed    Lt SLS - 9.9 secs on  3rd trial     [facility] Adult PT Treatment/Exercise - 08/28/20 0001      Ambulation/Gait   Gait velocity 7.69 secs = 4.27 ft/sec       Knee/Hip Exercises: Aerobic   Elliptical level 1.0 for 1 minute each fwd/bwd with bil UE support.      Knee/Hip Exercises: Machines for Strengthening   Cybex Leg Press bil. LE's 100# 10 reps:  LLE only 70# 10 reps                   PT Education - 08/28/20 2128    Education Details discussed progress and LTG's - plan D/C at today's visit    Person(s) Educated Patient    Methods Explanation    Comprehension Verbalized understanding            PT Short Term Goals - 08/07/20 1915      PT SHORT TERM GOAL #1   Title same as LTG's             PT Long Term Goals - 08/27/20 0933      PT LONG TERM GOAL #1   Title Increase FGA score to >/= 27/30 to increase safety and balance with ambulation.    Baseline 20/30 - 08-06-20;         29/30 - 08-27-20    Time 4    Period Weeks      Status Achieved      PT LONG TERM GOAL #2   Title Increase gait velocity from 3.52 ft/sec to >/= 3.9 ft/sec without device for incr. gait efficiency.    Baseline 9.31 secs = 3.52 ft/sec without device;          7.69, 8.15 = 4.27 ft/sec on 08-27-20    Time 4    Period Weeks    Status Achieved      PT LONG TERM GOAL #3   Title Pt will negotiate 4 steps without use of hand rail using a step over step sequence to demo improved balance.    Time 4    Period Weeks    Status Achieved      PT LONG TERM GOAL #4   Title Increase SLS on LLE to >/= 8 secs for increased safety with mobility.    Baseline 4.09 secs;   9.97 secs    Time 4    Period Weeks    Status Achieved      PT LONG TERM GOAL #5   Title Independent in HEP for LLE strengthening and balance exercises.    Time 4    Period Weeks    Status Achieved                 Plan - 08/28/20 2131    Clinical Impression Statement Pt has met 5/5 LTG's; pt is pleased with progress and states that he feels he is able to continue on his own with HEP.  Pt is discharged due to goals met.    Personal Factors and Comorbidities Comorbidity 2;Profession    Comorbidities chronic LBP, rhabdomyolysis (Feb. 2021), HTN, paresthesias LUE & LLE    Examination-Activity Limitations Locomotion Level;Squat;Stairs;Lift    Examination-Participation Restrictions Meal Prep;Cleaning;Community Activity;Laundry;Occupation;Shop    Rehab Potential Good    PT Frequency 2x / week    PT Duration 4 weeks   eval + 2x/week for 4 weeks = 9 visits   PT Next Visit Plan LLE strengthening, high level balance, gait without device    PT  Home Exercise Plan Access Code: [email or web address]    Consulted and Agree with Plan of Care Patient;Other (Comment)   fiancee          Patient will benefit from skilled therapeutic intervention in order to improve the following deficits and impairments:  Abnormal gait, Decreased endurance, Decreased activity tolerance, Decreased balance,  Decreased strength, Impaired sensation  Visit Diagnosis: Muscle weakness (generalized)  Other abnormalities of gait and mobility  Unsteadiness on feet     Problem List Patient Active Problem List   Diagnosis Date Noted  . At risk for depression 08/28/2020  . Adjustment disorder 08/28/2020  . High risk medication use 08/02/2020  . Ataxia 08/02/2020  . Essential hypertension 07/26/2020  . Other hyperlipidemia 07/26/2020  . Vitamin D deficiency 07/26/2020  . Prediabetes 07/26/2020  . At risk for activity intolerance 07/26/2020  . Hypertension 07/21/2020  . Multiple sclerosis ([place]) 07/21/2020  . Gait disturbance 07/20/2020  . Thrombocytosis 07/20/2020  . Elevated serum creatinine 07/20/2020  . Hyperglycemia 07/20/2020  . Magnetic resonance imaging of brain abnormal 07/20/2020  . Transaminitis 11/25/2019  . Rhabdomyolysis 11/24/2019  . Paresthesia and pain of both upper extremities   . Current smoker 07/02/2014  . Paresthesias 07/02/2014  . Left-sided weakness 07/02/2014  . Gynecomastia, male 07/02/2014      PHYSICAL THERAPY DISCHARGE SUMMARY  Visits from Start of Care:  7  Current functional level related to goals / functional outcomes: See above for progress towards goals   Remaining deficits: Decreased strength in Lt plantarflexors Decreased activity tolerance and decreased muscle endurance due to MS disease process Decreased high level balance skills and gait  Education / Equipment: Pt has been instructed in HEP for LLE strengthening and balance Plan: Patient agrees to discharge.  Patient goals were met. Patient is being discharged due to meeting the stated rehab goals.  ?????        Alda Lea, PT 08/28/2020, 9:35 PM  [facility] [address] [place], [place], [address] Phone: [phone]   Fax:  [phone]  Name: Cody Rodriguez MRN: [id number] Date of Birth:  [birth year]

## 2020-09-03 NOTE — Progress Notes (Signed)
Chief Complaint:   OBESITY Cody Rodriguez is here to discuss his progress with his obesity treatment plan along with follow-up of his obesity related diagnoses. Cody Rodriguez is on the Category 3 Plan and states he is following his eating plan approximately 90% of the time. Cody Rodriguez states he is walking 10-15 minutes 3 times per week.  Today's visit was #: 4 Starting weight: 255 lbs Starting date: 07/12/2020 Today's weight: 241 lbs Today's date: 08/28/2020 Total lbs lost to date: 14 lbs Total lbs lost since last in-office visit: 1 lb Total weight loss percentage to date: -5.49%  Interim History: With recent diagnosis of MS, Cody Rodriguez is a lot less active. His hunger and cravings are controlled. His snack calories are 2 cheese sticks and nothing else. Cody Rodriguez has been walking 10-15 minutes twice a day since his MS flare has subsided.  Assessment/Plan:   1. Essential hypertension Cody Rodriguez went on blood pressure medication during hospitalization in October. His blood pressure at home is 120-125/70-80 on medication. He is tolerating medication well with no side effects.  Plan: [name] denies need for refill. Continue medication daily with goal <120/80 blood pressure. Continue prudent nutritional plan and weight loss.  2. Vitamin D deficiency Cody Rodriguez is prescribed Vit D and is tolerating it well. He denies side effects or concerns.  Plan: Continue Vit D weekly and recheck in 3 months or so.  3. Adjustment disorder, unspecified type Cody Rodriguez has increased stress with diagnosis of MS lately. He denies suicidal ideations, depression, etc. But states it's challenging at times to himself positive.  Plan: Cody Rodriguez denies need for medications to help with mood. Keep positive and exercise as tolerated. I recommend that he ask neurology for names of support groups in the area for MS. Consider counseling if needed.  4. At risk for depression Cody Rodriguez was given approximately 9 minutes of depression risk  counseling today. He has risk factors for depression. We discussed the importance of a healthy work life balance, a healthy relationship with food and a good support system.  5. Class 1 obesity with serious comorbidity and body mass index (BMI) of 34.0 to 34.9 in adult, unspecified obesity type Cody Rodriguez is currently in the action stage of change. As such, his goal is to continue with weight loss efforts. He has agreed to the Category 3 Plan.   Exercise goals: As is. Cody Rodriguez as tolerated.  Behavioral modification strategies: holiday eating strategies  and celebration eating strategies.  Cody Rodriguez has agreed to follow-up with our clinic in 2-3 weeks. He was informed of the importance of frequent follow-up visits to maximize his success with intensive lifestyle modifications for his multiple health conditions.   Objective:   Blood pressure 120/76, pulse 84, temperature 98.4 F (36.9 C), height 5\' 10"  (1.778 m), weight 241 lb (109.3 kg), SpO2 100 %. Body mass index is 34.58 kg/m.  General: Cooperative, alert, well developed, in no acute distress. HEENT: Conjunctivae and lids unremarkable. Cardiovascular: Regular rhythm.  Lungs: Normal work of breathing. Neurologic: No focal deficits.   Lab Results  Component Value Date   CREATININE 1.21 07/24/2020   BUN 24 (H) 07/24/2020   NA 137 07/24/2020   K 4.0 07/24/2020   CL 102 07/24/2020   CO2 24 07/24/2020   Lab Results  Component Value Date   ALT 61 (H) 07/24/2020   AST 32 07/24/2020   ALKPHOS 65 07/24/2020   BILITOT 0.6 07/24/2020   Lab Results  Component Value Date   HGBA1C 5.8 (H) 07/12/2020  HGBA1C 5.1 06/26/2014   Lab Results  Component Value Date   INSULIN 26.1 (H) 07/12/2020   Lab Results  Component Value Date   TSH 1.410 07/12/2020   Lab Results  Component Value Date   CHOL 203 (H) 07/12/2020   HDL 38 (L) 07/12/2020   LDLCALC 146 (H) 07/12/2020   TRIG 107 07/12/2020   Lab Results  Component Value Date   WBC  14.1 (H) 07/24/2020   HGB 14.9 07/24/2020   HCT 45.2 07/24/2020   MCV 85.0 07/24/2020   PLT 443 (H) 07/24/2020   Attestation Statements:   Reviewed by clinician on day of visit: allergies, medications, problem list, medical history, surgical history, family history, social history, and previous encounter notes.   Edmund Hilda, am acting as Energy manager for [employer], DO.  I have reviewed the above documentation for accuracy and completeness, and I agree with the above. Carlye Grippe, D.O.  The 21st Century Cures Act was signed into law in 2016 which includes the topic of electronic health records.  This provides immediate access to information in MyChart.  This includes consultation notes, operative notes, office notes, lab results and pathology reports.  If you have any questions about what you read please let us know at your next visit so we can discuss your concerns and take corrective action if need be.  We are right here with you.

## 2020-09-04 ENCOUNTER — Encounter: Payer: 59 | Admitting: Occupational Therapy

## 2020-09-04 ENCOUNTER — Ambulatory Visit: Payer: 59 | Admitting: Physical Therapy

## 2020-09-06 ENCOUNTER — Ambulatory Visit: Payer: 59 | Admitting: Physical Therapy

## 2020-09-11 ENCOUNTER — Encounter (INDEPENDENT_AMBULATORY_CARE_PROVIDER_SITE_OTHER): Payer: Self-pay | Admitting: Family Medicine

## 2020-09-11 ENCOUNTER — Other Ambulatory Visit: Payer: Self-pay

## 2020-09-11 ENCOUNTER — Ambulatory Visit (INDEPENDENT_AMBULATORY_CARE_PROVIDER_SITE_OTHER): Payer: 59 | Admitting: Family Medicine

## 2020-09-11 VITALS — BP 113/66 | Temp 97.8°F | Ht 70.0 in | Wt 246.0 lb

## 2020-09-11 DIAGNOSIS — Z6835 Body mass index (BMI) 35.0-35.9, adult: Secondary | ICD-10-CM

## 2020-09-11 DIAGNOSIS — E559 Vitamin D deficiency, unspecified: Secondary | ICD-10-CM | POA: Diagnosis not present

## 2020-09-11 DIAGNOSIS — E66812 Obesity, class 2: Secondary | ICD-10-CM

## 2020-09-11 DIAGNOSIS — Z9189 Other specified personal risk factors, not elsewhere classified: Secondary | ICD-10-CM | POA: Diagnosis not present

## 2020-09-11 MED ORDER — VITAMIN D (ERGOCALCIFEROL) 1.25 MG (50000 UNIT) PO CAPS: 50000.0000 [IU] | ORAL_CAPSULE | ORAL | 0 refills | Status: DC

## 2020-09-12 NOTE — Progress Notes (Signed)
Chief Complaint:   OBESITY Cody Rodriguez is here to discuss his progress with his obesity treatment plan along with follow-up of his obesity related diagnoses. Cody Rodriguez is on the Category 3 Plan and states he is following his eating plan approximately 90% of the time. Cody Rodriguez states he is doing gym exercises and treadmill 60 minutes 3 times per week.  Today's visit was #: 5 Starting weight: 255 lbs Starting date: 07/12/2020 Today's weight: 246 lbs Today's date: 09/11/2020 Total lbs lost to date: 9 lbs Total lbs lost since last in-office visit: +5 lbs Total weight loss percentage to date: -3.53%  Interim History: Cody Rodriguez notes that he did not eat well over the Thanksgiving holiday. He went off the plan for 4 days or so, eating leftovers and doing little exercise. He already started back at the gym but hasn't bought his foods for the plan yet.   Plan: Strategies to get back on track.  Assessment/Plan:   1. Vitamin D deficiency Cody Rodriguez's Vitamin D level was 11.6 on 07/12/2020. He is currently taking prescription vitamin D 50,000 IU each week. He denies nausea, vomiting or muscle weakness.  Plan: Refill Vit D for 1 month, as per below. Low Vitamin D level contributes to fatigue and are associated with obesity, breast, and colon cancer. He agrees to continue to take prescription Vitamin D @50 ,000 IU every week and will follow-up for routine testing of Vitamin D, at least 2-3 times per year to avoid over-replacement.  Refill- Vitamin D, Ergocalciferol, (DRISDOL) 1.25 MG (50000 UNIT) CAPS capsule; Take 1 capsule (50,000 Units total) by mouth every 7 (seven) days.  Dispense: 4 capsule; Refill: 0  2. At risk for constipation Cody Rodriguez was given approximately 15 minutes of counseling today regarding prevention of constipation as patient gets back on plan. He was encouraged to increase water and fiber intake.   3. Class 2 severe obesity with serious comorbidity and body mass index (BMI) of 35.0 to  35.9 in adult, unspecified obesity type (HCC) Cody Rodriguez is currently in the action stage of change. As such, his goal is to continue with weight loss efforts. He has agreed to the Category 3 Plan.   Exercise goals: 3 days a week for 60 minutes and increase as tolerated  Behavioral modification strategies: increasing lean protein intake, decreasing simple carbohydrates, increasing water intake, meal planning and cooking strategies and planning for success.  Cody Rodriguez has agreed to follow-up with our clinic in 2-4 weeks. He was informed of the importance of frequent follow-up visits to maximize his success with intensive lifestyle modifications for his multiple health conditions.   Objective:   Blood pressure 113/66, temperature 97.8 F (36.6 C), height 5\' 10"  (1.778 m), weight 246 lb (111.6 kg), SpO2 100 %. Body mass index is 35.3 kg/m.  General: Cooperative, alert, well developed, in no acute distress. HEENT: Conjunctivae and lids unremarkable. Cardiovascular: Regular rhythm.  Lungs: Normal work of breathing. Neurologic: No focal deficits.   Lab Results  Component Value Date   CREATININE 1.21 07/24/2020   BUN 24 (H) 07/24/2020   NA 137 07/24/2020   K 4.0 07/24/2020   CL 102 07/24/2020   CO2 24 07/24/2020   Lab Results  Component Value Date   ALT 61 (H) 07/24/2020   AST 32 07/24/2020   ALKPHOS 65 07/24/2020   BILITOT 0.6 07/24/2020   Lab Results  Component Value Date   HGBA1C 5.8 (H) 07/12/2020   HGBA1C 5.1 06/26/2014   Lab Results  Component Value Date  INSULIN 26.1 (H) 07/12/2020   Lab Results  Component Value Date   TSH 1.410 07/12/2020   Lab Results  Component Value Date   CHOL 203 (H) 07/12/2020   HDL 38 (L) 07/12/2020   LDLCALC 146 (H) 07/12/2020   TRIG 107 07/12/2020   Lab Results  Component Value Date   WBC 14.1 (H) 07/24/2020   HGB 14.9 07/24/2020   HCT 45.2 07/24/2020   MCV 85.0 07/24/2020   PLT 443 (H) 07/24/2020    Attestation Statements:    Reviewed by clinician on day of visit: allergies, medications, problem list, medical history, surgical history, family history, social history, and previous encounter notes.  Edmund Hilda, am acting as Energy manager for [employer], DO.  I have reviewed the above documentation for accuracy and completeness, and I agree with the above. Carlye Grippe, D.O.  The 21st Century Cures Act was signed into law in 2016 which includes the topic of electronic health records.  This provides immediate access to information in MyChart.  This includes consultation notes, operative notes, office notes, lab results and pathology reports.  If you have any questions about what you read please let us know at your next visit so we can discuss your concerns and take corrective action if need be.  We are right here with you.

## 2020-09-25 ENCOUNTER — Other Ambulatory Visit: Payer: Self-pay

## 2020-09-25 ENCOUNTER — Ambulatory Visit (INDEPENDENT_AMBULATORY_CARE_PROVIDER_SITE_OTHER): Payer: 59 | Admitting: Physician Assistant

## 2020-09-25 ENCOUNTER — Encounter (INDEPENDENT_AMBULATORY_CARE_PROVIDER_SITE_OTHER): Payer: Self-pay | Admitting: Physician Assistant

## 2020-09-25 VITALS — BP 132/85 | HR 84 | Temp 98.5°F | Ht 70.0 in | Wt 243.0 lb

## 2020-09-25 DIAGNOSIS — Z6835 Body mass index (BMI) 35.0-35.9, adult: Secondary | ICD-10-CM

## 2020-09-25 DIAGNOSIS — I10 Essential (primary) hypertension: Secondary | ICD-10-CM

## 2020-09-25 DIAGNOSIS — E559 Vitamin D deficiency, unspecified: Secondary | ICD-10-CM | POA: Diagnosis not present

## 2020-09-25 DIAGNOSIS — Z9189 Other specified personal risk factors, not elsewhere classified: Secondary | ICD-10-CM | POA: Diagnosis not present

## 2020-09-25 MED ORDER — VITAMIN D (ERGOCALCIFEROL) 1.25 MG (50000 UNIT) PO CAPS: 50000.0000 [IU] | ORAL_CAPSULE | ORAL | 0 refills | Status: DC

## 2020-09-26 NOTE — Progress Notes (Signed)
Chief Complaint:   OBESITY Cody Rodriguez is here to discuss his progress with his obesity treatment plan along with follow-up of his obesity related diagnoses. Cody Rodriguez is on the Category 3 Plan and states he is following his eating plan approximately 90% of the time. Cody Rodriguez states he is walking for 30-60 minutes 4 times per week.  Today's visit was #: 6 Starting weight: 255 lbs Starting date: 07/12/2020 Today's weight: 243 lbs Today's date: 09/25/2020 Total lbs lost to date: 12 lbs Total lbs lost since last in-office visit: 3 lbs  Interim History: Cody Rodriguez is doing a great job with the plan.  He is not eating 2 ounces protein at lunch and is not eating his snacks.  He feels good and is not excessively hungry.  Subjective:   1. Vitamin D deficiency Zaiden's Vitamin D level was 11.6 on 07/12/2020. He is currently taking prescription vitamin D 50,000 IU each week. He denies nausea, vomiting or muscle weakness.  Last level at goal.  2. Essential hypertension On Norvasc.  Managed by PCP.  No headache or chest pain.  BP Readings from Last 3 Encounters:  09/25/20 132/85  09/11/20 113/66  08/28/20 120/76   3. At risk for heart disease Cody Rodriguez is at a higher than average risk for cardiovascular disease due to obesity.   Assessment/Plan:   1. Vitamin D deficiency Low Vitamin D level contributes to fatigue and are associated with obesity, breast, and colon cancer. He agrees to continue to take prescription Vitamin D @50 ,000 IU every week and will follow-up for routine testing of Vitamin D, at least 2-3 times per year to avoid over-replacement.  -Refill Vitamin D, Ergocalciferol, (DRISDOL) 1.25 MG (50000 UNIT) CAPS capsule; Take 1 capsule (50,000 Units total) by mouth every 7 (seven) days.  Dispense: 4 capsule; Refill: 0  2. Essential hypertension Brier is working on healthy weight loss and exercise to improve blood pressure control. We will watch for signs of hypotension as he  continues his lifestyle modifications.  Continue medications.  Monitor blood pressure each visit and follow-up with PCP.  3. At risk for heart disease Ashtan was given approximately 15 minutes of coronary artery disease prevention counseling today. He is 34 y.o. male and has risk factors for heart disease including obesity. We discussed intensive lifestyle modifications today with an emphasis on specific weight loss instructions and strategies.   Repetitive spaced learning was employed today to elicit superior memory formation and behavioral change.  4. Class 2 severe obesity with serious comorbidity and body mass index (BMI) of 35.0 to 35.9 in adult, unspecified obesity type (HCC)  Cody Rodriguez is currently in the action stage of change. As such, his goal is to continue with weight loss efforts. He has agreed to the Category 3 Plan.   Exercise goals: For substantial health benefits, adults should do at least 150 minutes (2 hours and 30 minutes) a week of moderate-intensity, or 75 minutes (1 hour and 15 minutes) a week of vigorous-intensity aerobic physical activity, or an equivalent combination of moderate- and vigorous-intensity aerobic activity. Aerobic activity should be performed in episodes of at least 10 minutes, and preferably, it should be spread throughout the week.  Behavioral modification strategies: increasing lean protein intake and meal planning and cooking strategies.  Cody Rodriguez has agreed to follow-up with our clinic in 3 weeks. He was informed of the importance of frequent follow-up visits to maximize his success with intensive lifestyle modifications for his multiple health conditions.   Objective:  Blood pressure 132/85, pulse 84, temperature 98.5 F (36.9 C), height 5\' 10"  (1.778 m), weight 243 lb (110.2 kg), SpO2 98 %. Body mass index is 34.87 kg/m.  General: Cooperative, alert, well developed, in no acute distress. HEENT: Conjunctivae and lids unremarkable. Cardiovascular:  Regular rhythm.  Lungs: Normal work of breathing. Neurologic: No focal deficits.   Lab Results  Component Value Date   CREATININE 1.21 07/24/2020   BUN 24 (H) 07/24/2020   NA 137 07/24/2020   K 4.0 07/24/2020   CL 102 07/24/2020   CO2 24 07/24/2020   Lab Results  Component Value Date   ALT 61 (H) 07/24/2020   AST 32 07/24/2020   ALKPHOS 65 07/24/2020   BILITOT 0.6 07/24/2020   Lab Results  Component Value Date   HGBA1C 5.8 (H) 07/12/2020   HGBA1C 5.1 06/26/2014   Lab Results  Component Value Date   INSULIN 26.1 (H) 07/12/2020   Lab Results  Component Value Date   TSH 1.410 07/12/2020   Lab Results  Component Value Date   CHOL 203 (H) 07/12/2020   HDL 38 (L) 07/12/2020   LDLCALC 146 (H) 07/12/2020   TRIG 107 07/12/2020   Lab Results  Component Value Date   WBC 14.1 (H) 07/24/2020   HGB 14.9 07/24/2020   HCT 45.2 07/24/2020   MCV 85.0 07/24/2020   PLT 443 (H) 07/24/2020   Attestation Statements:   Reviewed by clinician on day of visit: allergies, medications, problem list, medical history, surgical history, family history, social history, and previous encounter notes.  I, 07/26/2020, CMA, am acting as transcriptionist for Insurance claims handler, PA-C  I have reviewed the above documentation for accuracy and completeness, and I agree with the above. Alois Cliche, PA-C

## 2020-10-09 ENCOUNTER — Other Ambulatory Visit: Payer: Self-pay

## 2020-10-09 ENCOUNTER — Ambulatory Visit (INDEPENDENT_AMBULATORY_CARE_PROVIDER_SITE_OTHER): Payer: 59 | Admitting: Family Medicine

## 2020-10-09 ENCOUNTER — Encounter (INDEPENDENT_AMBULATORY_CARE_PROVIDER_SITE_OTHER): Payer: Self-pay | Admitting: Family Medicine

## 2020-10-09 VITALS — BP 118/69 | HR 81 | Temp 98.6°F | Ht 70.0 in | Wt 248.0 lb

## 2020-10-09 DIAGNOSIS — I10 Essential (primary) hypertension: Secondary | ICD-10-CM | POA: Diagnosis not present

## 2020-10-09 DIAGNOSIS — Z9189 Other specified personal risk factors, not elsewhere classified: Secondary | ICD-10-CM

## 2020-10-09 DIAGNOSIS — Z6835 Body mass index (BMI) 35.0-35.9, adult: Secondary | ICD-10-CM

## 2020-10-09 DIAGNOSIS — E559 Vitamin D deficiency, unspecified: Secondary | ICD-10-CM

## 2020-10-09 MED ORDER — VITAMIN D (ERGOCALCIFEROL) 1.25 MG (50000 UNIT) PO CAPS: 50000.0000 [IU] | ORAL_CAPSULE | ORAL | 0 refills | Status: DC

## 2020-10-11 NOTE — Progress Notes (Signed)
Chief Complaint:   OBESITY Cody Rodriguez is here to discuss his progress with his obesity treatment plan along with follow-up of his obesity related diagnoses. Cody Rodriguez is on the Category 3 Plan and states he is following his eating plan approximately 40-50% of the time. Cody Rodriguez states he is walking 60 minutes 3 times per week.  Today's visit was #: 7 Starting weight: 255 lbs Starting date: 07/12/2020 Today's weight: 248 lbs Today's date: 10/09/2020 Total lbs lost to date: 7 lbs Total lbs lost since last in-office visit: +5 lbs Total weight loss percentage to date: -2.75%  Interim History: Maximum reports that he did not eat on the plan the past few weeks. He maybe ate 40% on plan eating.   However, Cody Rodriguez feels a renewed commitment to getting healthy with the New Year though and plans on getting back at it.  Plan: More handouts on Category 3 given to patient.  Assessment/Plan:   1. Essential hypertension Renaud's blood pressure art home is 120/80 or less 2 times a week. He is prescribed Norvasc.  Plan: Cody Rodriguez's blood pressure is at goal on his current regimen. As he loses weight, we will closely monitor. Continue home blood pressure monitoring 2-3 times a week.   2. Vitamin D deficiency Cody Rodriguez's Vitamin D level was 11.6 on 07/12/2020. He is currently taking prescription vitamin D 50,000 IU each week. He denies nausea, vomiting or muscle weakness.   Ref. Range 07/12/2020 12:20  Vitamin D, 25-Hydroxy Latest Ref Range: 30.0 - 100.0 ng/mL 11.6 (L)   Plan:  Refill Vit D for 1 month, as per below.  - Reiterated importance of vitamin D (as well as calcium) to their health and wellbeing.  - Reminded Cody Rodriguez that weight loss will likely improve availability of vitamin D, thus encouraged him to continue with meal plan and their weight loss efforts to further improve this condition. - I recommend patient continue to take weekly prescription vit D 50,000 IU - Informed patient this  may be a lifelong thing, and he was encouraged to continue to take the medicine until told otherwise.   - we will need to monitor levels regularly (every 3-4 mo on average) to keep levels within normal limits.  - weight loss will likely improve availability of vitamin D, thus encouraged Cody Rodriguez to continue with meal plan and their weight loss efforts to further improve this condition - pt's questions and concerns regarding this condition addressed.  Refill- Vitamin D, Ergocalciferol, (DRISDOL) 1.25 MG (50000 UNIT) CAPS capsule; Take 1 capsule (50,000 Units total) by mouth every 7 (seven) days.  Dispense: 4 capsule; Refill: 0   3. At risk for complication associated with hypotension Cody Rodriguez was given approximately 9 minutes of education and counseling today regarding the condition of hypotension, the pathophysiology of the condition and concerns regarding prevention and treatment of this condition. We discussed risks of medications used to treat hypertension, which in the setting of weight loss, can inadvertently cause hypotension. Signs of hypotension such as feeling lightheaded or unsteady, esp when getting up first thing in the AM or off the toilet, were reviewed in detail and all questions were answered. The use of motivational interviewing was employed as a technique as well today to aid in the treatment of patient's multiple conditions. Pt understands importance of proper hydration and also of more prudent home BP monitoring while actively losing weight.  he will call us, or their PCP, or other specialists who treat their BP with medications, with any  questions or concerns that may develop.    4. Class 2 severe obesity with serious comorbidity and body mass index (BMI) of 35.0 to 35.9 in adult, unspecified obesity type (HCC) Cody Rodriguez is currently in the action stage of change. As such, his goal is to continue with weight loss efforts. He has agreed to the Category 3 Plan.   Exercise goals: As is  plus strengthening  Behavioral modification strategies: meal planning and cooking strategies, keeping healthy foods in the home and planning for success.  Cody Rodriguez has agreed to follow-up with our clinic in 2-3 weeks. He was informed of the importance of frequent follow-up visits to maximize his success with intensive lifestyle modifications for his multiple health conditions.   Objective:   Blood pressure 118/69, pulse 81, temperature 98.6 F (37 C), height 5\' 10"  (1.778 m), weight 248 lb (112.5 kg), SpO2 97 %. Body mass index is 35.58 kg/m.  General: Cooperative, alert, well developed, in no acute distress. HEENT: Conjunctivae and lids unremarkable. Cardiovascular: Regular rhythm.  Lungs: Normal work of breathing. Neurologic: No focal deficits.   Lab Results  Component Value Date   CREATININE 1.21 07/24/2020   BUN 24 (H) 07/24/2020   NA 137 07/24/2020   K 4.0 07/24/2020   CL 102 07/24/2020   CO2 24 07/24/2020   Lab Results  Component Value Date   ALT 61 (H) 07/24/2020   AST 32 07/24/2020   ALKPHOS 65 07/24/2020   BILITOT 0.6 07/24/2020   Lab Results  Component Value Date   HGBA1C 5.8 (H) 07/12/2020   HGBA1C 5.1 06/26/2014   Lab Results  Component Value Date   INSULIN 26.1 (H) 07/12/2020   Lab Results  Component Value Date   TSH 1.410 07/12/2020   Lab Results  Component Value Date   CHOL 203 (H) 07/12/2020   HDL 38 (L) 07/12/2020   LDLCALC 146 (H) 07/12/2020   TRIG 107 07/12/2020   Lab Results  Component Value Date   WBC 14.1 (H) 07/24/2020   HGB 14.9 07/24/2020   HCT 45.2 07/24/2020   MCV 85.0 07/24/2020   PLT 443 (H) 07/24/2020    Attestation Statements:   Reviewed by clinician on day of visit: allergies, medications, problem list, medical history, surgical history, family history, social history, and previous encounter notes.  07/26/2020, am acting as Edmund Hilda for Energy manager, DO.  I have reviewed the above documentation for  accuracy and completeness, and I agree with the above. [employer], D.O.  The 21st Century Cures Act was signed into law in 2016 which includes the topic of electronic health records.  This provides immediate access to information in MyChart.  This includes consultation notes, operative notes, office notes, lab results and pathology reports.  If you have any questions about what you read please let 2017 know at your next visit so we can discuss your concerns and take corrective action if need be.  We are right here with you.

## 2020-10-22 ENCOUNTER — Ambulatory Visit: Payer: Self-pay | Admitting: Neurology

## 2020-10-25 ENCOUNTER — Telehealth (INDEPENDENT_AMBULATORY_CARE_PROVIDER_SITE_OTHER): Payer: 59 | Admitting: Family Medicine

## 2020-10-25 ENCOUNTER — Encounter (INDEPENDENT_AMBULATORY_CARE_PROVIDER_SITE_OTHER): Payer: Self-pay | Admitting: Family Medicine

## 2020-10-25 ENCOUNTER — Other Ambulatory Visit: Payer: Self-pay

## 2020-10-25 DIAGNOSIS — R7303 Prediabetes: Secondary | ICD-10-CM

## 2020-10-25 DIAGNOSIS — Z6835 Body mass index (BMI) 35.0-35.9, adult: Secondary | ICD-10-CM

## 2020-10-29 NOTE — Progress Notes (Signed)
TeleHealth Visit:  Due to the COVID-19 pandemic, this visit was completed with telemedicine (audio/video) technology to reduce patient and provider exposure as well as to preserve personal protective equipment.   Cody Rodriguez has verbally consented to this TeleHealth visit. The patient is located at home, the provider is located at the [facility] office. The participants in this visit include the listed provider and patient .The visit was conducted today via MyChart Video.    Chief Complaint: OBESITY Cody Rodriguez is here to discuss his progress with his obesity treatment plan along with follow-up of his obesity related diagnoses. Cody Rodriguez is on the Category 3 Plan and states he is following his eating plan approximately 60% of the time. Cody Rodriguez states he is walking 30-60 minutes 7 times per week.  Today's visit was #: 8 Starting weight: 255 lbs Starting date: 10/09/2020  Interim History: Cody Rodriguez has not weighed at home and does not have a weight to report today. Cody Rodriguez likes the food on the plan and eats the prescribed foods except for the the [place] yogurt. He does not like the texture of [place] yogurt.Cody Rodriguez craves sweets and has them 2-3 times a week. He reports he does not usually eat out.  Cody Rodriguez eats a sandwich for lunch and his hunger is satisfied. He denies drinking sugared sweetened beverages. He drinks water and "Clear".    Subjective:   Prediabetes . Cody Rodriguez's A1c is 5.8. Champion denies excessive hunger. He does have sweet cravings. Cody Rodriguez is not on metformin  Lab Results  Component Value Date   HGBA1C 5.8 (H) 07/12/2020   Lab Results  Component Value Date   INSULIN 26.1 (H) 07/12/2020   Assessment/Plan:   1. Prediabetes Cody Rodriguez will continue to work on weight loss, exercise, and decreasing simple carbohydrates to help decrease the risk of diabetes.  Discussed having Protein One bars hand when he has sweet cravings.  2. Class 2 severe obesity with  serious comorbidity and body mass index (BMI) of 35.0 to 35.9 in adult, unspecified obesity type (HCC)  Cody Rodriguez is currently in the action stage of change. As such, his goal is to continue with weight loss efforts. He has agreed to the Category 3 Plan.   Exercise goals: As is.  Behavioral modification strategies: decreasing simple carbohydrates and better snacking choices.  Cody Rodriguez has agreed to follow-up with our clinic in 2-3 weeks.   Objective:   VITALS: Per patient if applicable, see vitals. GENERAL: Alert and in no acute distress. CARDIOPULMONARY: No increased WOB. Speaking in clear sentences.  PSYCH: Pleasant and cooperative. Speech normal rate and rhythm. Affect is appropriate. Insight and judgement are appropriate. Attention is focused, linear, and appropriate.  NEURO: Oriented as arrived to appointment on time with no prompting.   Lab Results  Component Value Date   CREATININE 1.21 07/24/2020   BUN 24 (H) 07/24/2020   NA 137 07/24/2020   K 4.0 07/24/2020   CL 102 07/24/2020   CO2 24 07/24/2020   Lab Results  Component Value Date   ALT 61 (H) 07/24/2020   AST 32 07/24/2020   ALKPHOS 65 07/24/2020   BILITOT 0.6 07/24/2020   Lab Results  Component Value Date   HGBA1C 5.8 (H) 07/12/2020   HGBA1C 5.1 06/26/2014   Lab Results  Component Value Date   INSULIN 26.1 (H) 07/12/2020   Lab Results  Component Value Date   TSH 1.410 07/12/2020   Lab Results  Component Value Date   CHOL 203 (H)  07/12/2020   HDL 38 (L) 07/12/2020   LDLCALC 146 (H) 07/12/2020   TRIG 107 07/12/2020   Lab Results  Component Value Date   WBC 14.1 (H) 07/24/2020   HGB 14.9 07/24/2020   HCT 45.2 07/24/2020   MCV 85.0 07/24/2020   PLT 443 (H) 07/24/2020   No results found for: IRON, TIBC, FERRITIN  Attestation Statements:   Reviewed by clinician on day of visit: allergies, medications, problem list, medical history, surgical history, family history, social history, and previous  encounter notes.    Felecia Jan, am acting as Energy manager for Jesse Sans, FNP.  I have reviewed the above documentation for accuracy and completeness, and I agree with the above. - Jesse Sans, FNP

## 2020-10-30 DIAGNOSIS — E669 Obesity, unspecified: Secondary | ICD-10-CM | POA: Insufficient documentation

## 2020-11-08 ENCOUNTER — Other Ambulatory Visit: Payer: Self-pay

## 2020-11-08 ENCOUNTER — Encounter (INDEPENDENT_AMBULATORY_CARE_PROVIDER_SITE_OTHER): Payer: Self-pay | Admitting: Family Medicine

## 2020-11-08 ENCOUNTER — Ambulatory Visit (INDEPENDENT_AMBULATORY_CARE_PROVIDER_SITE_OTHER): Payer: 59 | Admitting: Family Medicine

## 2020-11-08 VITALS — BP 122/74 | HR 65 | Temp 98.2°F | Ht 70.0 in | Wt 243.0 lb

## 2020-11-08 DIAGNOSIS — G35 Multiple sclerosis: Secondary | ICD-10-CM | POA: Diagnosis not present

## 2020-11-08 DIAGNOSIS — E669 Obesity, unspecified: Secondary | ICD-10-CM | POA: Diagnosis not present

## 2020-11-08 DIAGNOSIS — Z6834 Body mass index (BMI) 34.0-34.9, adult: Secondary | ICD-10-CM | POA: Diagnosis not present

## 2020-11-08 DIAGNOSIS — E559 Vitamin D deficiency, unspecified: Secondary | ICD-10-CM | POA: Diagnosis not present

## 2020-11-08 MED ORDER — VITAMIN D (ERGOCALCIFEROL) 1.25 MG (50000 UNIT) PO CAPS: 50000.0000 [IU] | ORAL_CAPSULE | ORAL | 0 refills | Status: DC

## 2020-11-12 ENCOUNTER — Encounter (INDEPENDENT_AMBULATORY_CARE_PROVIDER_SITE_OTHER): Payer: Self-pay | Admitting: Family Medicine

## 2020-11-12 NOTE — Progress Notes (Signed)
Chief Complaint:   OBESITY Cody Rodriguez is here to discuss his progress with his obesity treatment plan along with follow-up of his obesity related diagnoses. Cody Rodriguez is on the Category 3 Plan and states he is following his eating plan approximately 50% of the time. Cody Rodriguez states he is walking for 30-60 minutes 5 times per week.  Today's visit was #: 9 Starting weight: 255 lbs Starting date: 10/09/2020 Today's weight: 243 lbs Today's date: 11/08/2020 Total lbs lost to date: 12 Total lbs lost since last in-office visit: 5  Interim History: Cody Rodriguez notes he has been snacking less. He has been having to Fair life ice cream for a snack. . He had been snacking on cookies, candy, and chips. He is getting his protein in consistently. He and his fiance both cook and he reports supper is mostly on plan. His hunger is satisfied.  Subjective:   1. Vitamin D deficiency Cody Rodriguez's last Vit D level was very low at 11.6. He is on weekly prescription Vit D.  2. Multiple sclerosis (HCC) Cody Rodriguez was diagnosed in October with multiple sclerosis. It is currently well controlled with Ocrevus every 6 months.  Assessment/Plan:   1. Vitamin D deficiency  Cody Rodriguez will follow-up for routine testing of Vitamin D, at least 2-3 times per year to avoid over-replacement.  - Vitamin D, Ergocalciferol, (DRISDOL) 1.25 MG (50000 UNIT) CAPS capsule; Take 1 capsule (50,000 Units total) by mouth every 7 (seven) days.  Dispense: 4 capsule; Refill: 0  2. Multiple sclerosis (HCC) Cody Rodriguez will continue to follow up with Neurology as directed.  3. Class 1 obesity with serious comorbidity and body mass index (BMI) of 34.0 to 34.9 in adult, unspecified obesity type Cody Rodriguez is currently in the action stage of change. As such, his goal is to continue with weight loss efforts. He has agreed to the Category 3 Plan.   Exercise goals: As is.  Behavioral modification strategies: decreasing simple carbohydrates and better snacking  choices.  Cody Rodriguez has agreed to follow-up with our clinic in 2 weeks.    Objective:   Blood pressure 122/74, pulse 65, temperature 98.2 F (36.8 C), height 5\' 10"  (1.778 m), weight 243 lb (110.2 kg), SpO2 100 %. Body mass index is 34.87 kg/m.  General: Cooperative, alert, well developed, in no acute distress. HEENT: Conjunctivae and lids unremarkable. Cardiovascular: Regular rhythm.  Lungs: Normal work of breathing. Neurologic: No focal deficits.   Lab Results  Component Value Date   CREATININE 1.21 07/24/2020   BUN 24 (H) 07/24/2020   NA 137 07/24/2020   K 4.0 07/24/2020   CL 102 07/24/2020   CO2 24 07/24/2020   Lab Results  Component Value Date   ALT 61 (H) 07/24/2020   AST 32 07/24/2020   ALKPHOS 65 07/24/2020   BILITOT 0.6 07/24/2020   Lab Results  Component Value Date   HGBA1C 5.8 (H) 07/12/2020   HGBA1C 5.1 06/26/2014   Lab Results  Component Value Date   INSULIN 26.1 (H) 07/12/2020   Lab Results  Component Value Date   TSH 1.410 07/12/2020   Lab Results  Component Value Date   CHOL 203 (H) 07/12/2020   HDL 38 (L) 07/12/2020   LDLCALC 146 (H) 07/12/2020   TRIG 107 07/12/2020   Lab Results  Component Value Date   WBC 14.1 (H) 07/24/2020   HGB 14.9 07/24/2020   HCT 45.2 07/24/2020   MCV 85.0 07/24/2020   PLT 443 (H) 07/24/2020   No results found for:  IRON, TIBC, FERRITIN  Attestation Statements:   Reviewed by clinician on day of visit: allergies, medications, problem list, medical history, surgical history, family history, social history, and previous encounter notes.   Cody Rodriguez, am acting as Location manager for [employer], FNP-C.  I have reviewed the above documentation for accuracy and completeness, and I agree with the above. -  Cody Fick, FNP

## 2020-11-21 ENCOUNTER — Ambulatory Visit (INDEPENDENT_AMBULATORY_CARE_PROVIDER_SITE_OTHER): Payer: 59 | Admitting: Family Medicine

## 2020-11-22 ENCOUNTER — Encounter (INDEPENDENT_AMBULATORY_CARE_PROVIDER_SITE_OTHER): Payer: Self-pay | Admitting: Physician Assistant

## 2020-11-22 ENCOUNTER — Ambulatory Visit (INDEPENDENT_AMBULATORY_CARE_PROVIDER_SITE_OTHER): Payer: 59 | Admitting: Physician Assistant

## 2020-11-22 ENCOUNTER — Other Ambulatory Visit: Payer: Self-pay

## 2020-11-22 VITALS — BP 127/79 | HR 64 | Temp 97.9°F | Ht 70.0 in | Wt 243.0 lb

## 2020-11-22 DIAGNOSIS — E669 Obesity, unspecified: Secondary | ICD-10-CM

## 2020-11-22 DIAGNOSIS — Z6834 Body mass index (BMI) 34.0-34.9, adult: Secondary | ICD-10-CM

## 2020-11-22 DIAGNOSIS — I10 Essential (primary) hypertension: Secondary | ICD-10-CM

## 2020-11-22 DIAGNOSIS — E559 Vitamin D deficiency, unspecified: Secondary | ICD-10-CM

## 2020-11-26 NOTE — Progress Notes (Signed)
Chief Complaint:   OBESITY Cody Rodriguez is here to discuss his progress with his obesity treatment plan along with follow-up of his obesity related diagnoses. Cody Rodriguez is on the Category 3 Plan and states he is following his eating plan approximately 60% of the time. Cody Rodriguez states he is walking for 30-60 minutes 5 times per week.  Today's visit was #: 10 Starting weight: 255 lbs Starting date: 10/09/2020 Today's weight: 243 lbs Today's date: 11/22/2020 Total lbs lost to date: 12 Total lbs lost since last in-office visit: 0  Interim History: Cody Rodriguez reports that he has not been eating fully on the plan due to financial reasons. He is not eating snack calories on some days, but other days he is overeating his snack calories with Fair life ice cream.  Subjective:   1. Vitamin D deficiency Cody Rodriguez is on Vit D, and he denies nausea, vomiting, or muscle weakness.  2. Essential hypertension Cody Rodriguez is on amlodipine, and his blood pressure is controlled. He denies chest pain or headache.  Assessment/Plan:   1. Vitamin D deficiency Low Vitamin D level contributes to fatigue and are associated with obesity, breast, and colon cancer. Cody Rodriguez agreed to prescription Vitamin D 50,000 IU every week and will follow-up for routine testing of Vitamin D, at least 2-3 times per year to avoid over-replacement.  2. Essential hypertension Cody Rodriguez will continue with medications, and will continue working on healthy weight loss and exercise to improve blood pressure control. We will watch for signs of hypotension as he continues his lifestyle modifications.  3. Class 1 obesity with serious comorbidity and body mass index (BMI) of 34.0 to 34.9 in adult, unspecified obesity type Cody Rodriguez is currently in the action stage of change. As such, his goal is to continue with weight loss efforts. He has agreed to the Category 3 Plan + 100 calories or keeping a food journal and adhering to recommended goals of 1600-1700  calories and 110 grams of protein daily.   We will recheck fasting labs at his next visit.  Exercise goals: As is.  Behavioral modification strategies: meal planning and cooking strategies and keeping healthy foods in the home.  Cody Rodriguez has agreed to follow-up with our clinic in 2 weeks. He was informed of the importance of frequent follow-up visits to maximize his success with intensive lifestyle modifications for his multiple health conditions.   Objective:   Blood pressure 127/79, pulse 64, temperature 97.9 F (36.6 C), height 5\' 10"  (1.778 m), weight 243 lb (110.2 kg), SpO2 98 %. Body mass index is 34.87 kg/m.  General: Cooperative, alert, well developed, in no acute distress. HEENT: Conjunctivae and lids unremarkable. Cardiovascular: Regular rhythm.  Lungs: Normal work of breathing. Neurologic: No focal deficits.   Lab Results  Component Value Date   CREATININE 1.21 07/24/2020   BUN 24 (H) 07/24/2020   NA 137 07/24/2020   K 4.0 07/24/2020   CL 102 07/24/2020   CO2 24 07/24/2020   Lab Results  Component Value Date   ALT 61 (H) 07/24/2020   AST 32 07/24/2020   ALKPHOS 65 07/24/2020   BILITOT 0.6 07/24/2020   Lab Results  Component Value Date   HGBA1C 5.8 (H) 07/12/2020   HGBA1C 5.1 06/26/2014   Lab Results  Component Value Date   INSULIN 26.1 (H) 07/12/2020   Lab Results  Component Value Date   TSH 1.410 07/12/2020   Lab Results  Component Value Date   CHOL 203 (H) 07/12/2020   HDL 38 (  L) 07/12/2020   LDLCALC 146 (H) 07/12/2020   TRIG 107 07/12/2020   Lab Results  Component Value Date   WBC 14.1 (H) 07/24/2020   HGB 14.9 07/24/2020   HCT 45.2 07/24/2020   MCV 85.0 07/24/2020   PLT 443 (H) 07/24/2020   No results found for: IRON, TIBC, FERRITIN  Attestation Statements:   Reviewed by clinician on day of visit: allergies, medications, problem list, medical history, surgical history, family history, social history, and previous encounter  notes.  Time spent on visit including pre-visit chart review and post-visit care and charting was 30 minutes.    Trude Mcburney, am acting as transcriptionist for [employer], PA-C.  I have reviewed the above documentation for accuracy and completeness, and I agree with the above. Alois Cliche, PA-C

## 2020-12-05 ENCOUNTER — Other Ambulatory Visit: Payer: Self-pay

## 2020-12-05 ENCOUNTER — Encounter (INDEPENDENT_AMBULATORY_CARE_PROVIDER_SITE_OTHER): Payer: Self-pay | Admitting: Family Medicine

## 2020-12-05 ENCOUNTER — Ambulatory Visit (INDEPENDENT_AMBULATORY_CARE_PROVIDER_SITE_OTHER): Payer: 59 | Admitting: Family Medicine

## 2020-12-05 VITALS — BP 127/77 | HR 71 | Temp 98.0°F | Ht 70.0 in | Wt 246.0 lb

## 2020-12-05 DIAGNOSIS — E559 Vitamin D deficiency, unspecified: Secondary | ICD-10-CM | POA: Diagnosis not present

## 2020-12-05 DIAGNOSIS — E7849 Other hyperlipidemia: Secondary | ICD-10-CM | POA: Diagnosis not present

## 2020-12-05 DIAGNOSIS — R7303 Prediabetes: Secondary | ICD-10-CM | POA: Diagnosis not present

## 2020-12-05 DIAGNOSIS — Z6835 Body mass index (BMI) 35.0-35.9, adult: Secondary | ICD-10-CM

## 2020-12-05 DIAGNOSIS — Z9189 Other specified personal risk factors, not elsewhere classified: Secondary | ICD-10-CM

## 2020-12-05 MED ORDER — VITAMIN D (ERGOCALCIFEROL) 1.25 MG (50000 UNIT) PO CAPS: 50000.0000 [IU] | ORAL_CAPSULE | ORAL | 0 refills | Status: DC

## 2020-12-06 ENCOUNTER — Encounter (INDEPENDENT_AMBULATORY_CARE_PROVIDER_SITE_OTHER): Payer: Self-pay | Admitting: Family Medicine

## 2020-12-06 LAB — LIPID PANEL WITH LDL/HDL RATIO
Cholesterol, Total: 184 mg/dL (ref 100–199)
HDL: 39 mg/dL — ABNORMAL LOW (ref >39)
LDL Chol Calc (NIH): 129 mg/dL — ABNORMAL HIGH (ref 0–99)
LDL/HDL Ratio: 3.3 ratio (ref 0.0–3.6)
Triglycerides: 85 mg/dL (ref 0–149)
VLDL Cholesterol Cal: 16 mg/dL (ref 5–40)

## 2020-12-06 LAB — COMPREHENSIVE METABOLIC PANEL
ALT: 19 IU/L (ref 0–44)
AST: 17 IU/L (ref 0–40)
Albumin/Globulin Ratio: 1.5 (ref 1.2–2.2)
Albumin: 4.3 g/dL (ref 4.0–5.0)
Alkaline Phosphatase: 75 IU/L (ref 44–121)
BUN/Creatinine Ratio: 11 (ref 9–20)
BUN: 12 mg/dL (ref 6–20)
Bilirubin Total: 0.5 mg/dL (ref 0.0–1.2)
CO2: 21 mmol/L (ref 20–29)
Calcium: 9.3 mg/dL (ref 8.7–10.2)
Chloride: 101 mmol/L (ref 96–106)
Creatinine, Ser: 1.08 mg/dL (ref 0.76–1.27)
Globulin, Total: 2.9 g/dL (ref 1.5–4.5)
Glucose: 74 mg/dL (ref 65–99)
Potassium: 4.1 mmol/L (ref 3.5–5.2)
Sodium: 138 mmol/L (ref 134–144)
Total Protein: 7.2 g/dL (ref 6.0–8.5)
eGFR: 92 mL/min/{1.73_m2} (ref >59)

## 2020-12-06 LAB — VITAMIN D 25 HYDROXY (VIT D DEFICIENCY, FRACTURES): Vit D, 25-Hydroxy: 36.1 ng/mL (ref 30.0–100.0)

## 2020-12-06 LAB — HEMOGLOBIN A1C
Est. average glucose Bld gHb Est-mCnc: 114 mg/dL
Hgb A1c MFr Bld: 5.6 % (ref 4.8–5.6)

## 2020-12-06 LAB — INSULIN, RANDOM: INSULIN: 28.2 u[IU]/mL — ABNORMAL HIGH (ref 2.6–24.9)

## 2020-12-06 NOTE — Progress Notes (Signed)
Chief Complaint:   OBESITY Cody Rodriguez is here to discuss his progress with his obesity treatment plan along with follow-up of his obesity related diagnoses. Cody Rodriguez is on the Category 3 Plan + 100 and states he is following his eating plan approximately 70% of the time. Cody Rodriguez states he is walking 60 minutes 5 times per week.  Today's visit was #: 11 Starting weight: 255 lbs Starting date: 1//01/2021 Today's weight: 246 lbs Today's date: 12/05/2020 Total lbs lost to date: 9 lbs Total lbs lost since last in-office visit: 0  Interim History: Cody Rodriguez notes that he is on plan at breakfast and lunch but struggles at supper. His fiance cooks and not always on plan. He also tends to eat carbs at supper because that is what is cooked.  Subjective:   1. Pre-diabetes Cody Rodriguez is not on Metformin. His last A1c was 5.8.  2. Vitamin D deficiency Cody Rodriguez's Vit D level is very low at 11.6. He is on weekly prescription Vit D.  3. Other hyperlipidemia Cody Rodriguez's last LDL was elevated at 146. His triglycerides were within normal limits and HDL was low at 38. He is not on statin therapy. Lab Results  Component Value Date   CHOL 184 12/05/2020   HDL 39 (L) 12/05/2020   LDLCALC 129 (H) 12/05/2020   TRIG 85 12/05/2020    4. At risk for diabetes mellitus Cody Rodriguez is at higher than average risk for developing diabetes due to obesity.   Assessment/Plan:   1. Pre-diabetes . Check labs today.  - Comprehensive metabolic panel - Hemoglobin A1c - Insulin, random  2. Vitamin D deficiency . He agrees to continue to take prescription Vitamin D @50 ,000 IU every week and will follow-up for routine testing of Vitamin D, at least 2-3 times per year to avoid over-replacement. Check labs today.  - VITAMIN D 25 Hydroxy (Vit-D Deficiency, Fractures)  - Vitamin D, Ergocalciferol, (DRISDOL) 1.25 MG (50000 UNIT) CAPS capsule; Take 1 capsule (50,000 Units total) by mouth every 7 (seven) days.  Dispense: 4  capsule; Refill: 0  3. Other hyperlipidemia Cardiovascular risk and specific lipid/LDL goals reviewed.  Check labs today.  - Lipid Panel With LDL/HDL Ratio  4. At risk for diabetes mellitus Cody Rodriguez was given approximately 15 minutes of diabetes education and counseling today. We discussed intensive lifestyle modifications today with an emphasis on weight loss as well as increasing exercise and decreasing simple carbohydrates in his diet. We also reviewed medication options with an emphasis on risk versus benefit of those discussed.   Repetitive spaced learning was employed today to elicit superior memory formation and behavioral change.  5. Class 2 severe obesity with serious comorbidity and body mass index (BMI) of 35.0 to 35.9 in adult, unspecified obesity type (HCC) Cody Rodriguez is currently in the action stage of change. As such, his goal is to continue with weight loss efforts. He has agreed to the Category 3 Plan + 100.   Substitute vegetable for carbs at dinner.  Exercise goals: As is  Behavioral modification strategies: increasing lean protein intake and decreasing simple carbohydrates.  Cody Rodriguez has agreed to follow-up with our clinic in 3 weeks.  Cody Rodriguez was informed we would discuss his lab results at his next visit unless there is a critical issue that needs to be addressed sooner. Cody Rodriguez agreed to keep his next visit at the agreed upon time to discuss these results.  Objective:   Blood pressure 127/77, pulse 71, temperature 98 F (36.7 C), temperature source Oral,  height 5\' 10"  (1.778 m), weight 246 lb (111.6 kg), SpO2 100 %. Body mass index is 35.3 kg/m.  General: Cooperative, alert, well developed, in no acute distress. HEENT: Conjunctivae and lids unremarkable. Cardiovascular: Regular rhythm.  Lungs: Normal work of breathing. Neurologic: No focal deficits.   Lab Results  Component Value Date   CREATININE 1.21 07/24/2020   BUN 24 (H) 07/24/2020   NA 137 07/24/2020    K 4.0 07/24/2020   CL 102 07/24/2020   CO2 24 07/24/2020   Lab Results  Component Value Date   ALT 61 (H) 07/24/2020   AST 32 07/24/2020   ALKPHOS 65 07/24/2020   BILITOT 0.6 07/24/2020   Lab Results  Component Value Date   HGBA1C 5.8 (H) 07/12/2020   HGBA1C 5.1 06/26/2014   Lab Results  Component Value Date   INSULIN 26.1 (H) 07/12/2020   Lab Results  Component Value Date   TSH 1.410 07/12/2020   Lab Results  Component Value Date   CHOL 203 (H) 07/12/2020   HDL 38 (L) 07/12/2020   LDLCALC 146 (H) 07/12/2020   TRIG 107 07/12/2020   Lab Results  Component Value Date   WBC 14.1 (H) 07/24/2020   HGB 14.9 07/24/2020   HCT 45.2 07/24/2020   MCV 85.0 07/24/2020   PLT 443 (H) 07/24/2020   Attestation Statements:   Reviewed by clinician on day of visit: allergies, medications, problem list, medical history, surgical history, family history, social history, and previous encounter notes.  07/26/2020, am acting as Edmund Hilda for Energy manager, FNP.  I have reviewed the above documentation for accuracy and completeness, and I agree with the above. -  [place], FNP

## 2020-12-26 ENCOUNTER — Ambulatory Visit (INDEPENDENT_AMBULATORY_CARE_PROVIDER_SITE_OTHER): Payer: 59 | Admitting: Family Medicine

## 2021-01-03 ENCOUNTER — Other Ambulatory Visit: Payer: Self-pay

## 2021-01-03 ENCOUNTER — Encounter (INDEPENDENT_AMBULATORY_CARE_PROVIDER_SITE_OTHER): Payer: Self-pay | Admitting: Family Medicine

## 2021-01-03 ENCOUNTER — Ambulatory Visit (INDEPENDENT_AMBULATORY_CARE_PROVIDER_SITE_OTHER): Payer: 59 | Admitting: Family Medicine

## 2021-01-03 VITALS — BP 121/69 | HR 80 | Temp 98.5°F | Ht 70.0 in | Wt 244.0 lb

## 2021-01-03 DIAGNOSIS — Z9189 Other specified personal risk factors, not elsewhere classified: Secondary | ICD-10-CM | POA: Diagnosis not present

## 2021-01-03 DIAGNOSIS — R7303 Prediabetes: Secondary | ICD-10-CM | POA: Diagnosis not present

## 2021-01-03 DIAGNOSIS — E559 Vitamin D deficiency, unspecified: Secondary | ICD-10-CM | POA: Diagnosis not present

## 2021-01-03 DIAGNOSIS — Z6836 Body mass index (BMI) 36.0-36.9, adult: Secondary | ICD-10-CM | POA: Diagnosis not present

## 2021-01-03 MED ORDER — VITAMIN D (ERGOCALCIFEROL) 1.25 MG (50000 UNIT) PO CAPS
50000.0000 [IU] | ORAL_CAPSULE | ORAL | 0 refills | Status: DC
Start: 2021-01-03 — End: 2021-01-24

## 2021-01-08 NOTE — Progress Notes (Signed)
Chief Complaint:   OBESITY Cody Rodriguez is here to discuss his progress with his obesity treatment plan along with follow-up of his obesity related diagnoses. Cody Rodriguez is on the Category 3 Plan + 100 calories and states he is following his eating plan approximately 80-85% of the time. Cody Rodriguez states he is at the gym and doing cardio for 60 minutes 5 times per week.  Today's visit was #: 12 Starting weight: 255 lbs Starting date: 10/09/2020 Today's weight: 244 lbs Today's date: 01/03/2021 Total lbs lost to date: 11 Total lbs lost since last in-office visit: 2  Interim History: Cody Rodriguez feels he has done well on the plan. His hunger is well satisfied. He is getting protein in and drinking adequate water.  Subjective:   1. Vitamin D deficiency Cody Rodriguez's last Vit D level was low at 36.1. He is on weekly prescription Vit D. His Vit D level has increased from 11.6 five months ago. I discussed labs with the patient today.  2. Pre-diabetes Cody Rodriguez's A1c has improved to 5.6 from 5.8. five months ago. He is not on metformin. He has a strong family history of diabetes mellitus. I discussed labs with the patient today.  Lab Results  Component Value Date   HGBA1C 5.6 12/05/2020   Lab Results  Component Value Date   INSULIN 28.2 (H) 12/05/2020   INSULIN 26.1 (H) 07/12/2020   3. At risk for diabetes mellitus Cody Rodriguez is at higher than average risk for developing diabetes due to obesity, prediabetic status, and strong family hx of DM.Marland Kitchen   Assessment/Plan:   1. Vitamin D deficiency  We will refill prescription Vitamin D for 1 month. Cody Rodriguez will follow-up for routine testing of Vitamin D, at least 2-3 times per year to avoid over-replacement.  - Vitamin D, Ergocalciferol, (DRISDOL) 1.25 MG (50000 UNIT) CAPS capsule; Take 1 capsule (50,000 Units total) by mouth every 7 (seven) days.  Dispense: 4 capsule; Refill: 0  2. Pre-diabetes Cody Rodriguez will continue his meal plan, and will continue to work on  weight loss, exercise, and decreasing simple carbohydrates to help decrease the risk of diabetes.   3. At risk for diabetes mellitus Cody Rodriguez was given approximately 15 minutes of diabetes education and counseling today. We discussed intensive lifestyle modifications today with an emphasis on weight loss as well as increasing exercise and decreasing simple carbohydrates in his diet. We also reviewed medication options with an emphasis on risk versus benefit of those discussed.   Repetitive spaced learning was employed today to elicit superior memory formation and behavioral change.  4. Obesity: BMI 35 Cody Rodriguez is currently in the action stage of change. As such, his goal is to continue with weight loss efforts. He has agreed to the Category 3 Plan + 100 calories.   Exercise goals: As is.  Behavioral modification strategies: planning for success.  Cody Rodriguez has agreed to follow-up with our clinic in 3 weeks.    Objective:   Blood pressure 121/69, pulse 80, temperature 98.5 F (36.9 C), height 5\' 10"  (1.778 m), weight 244 lb (110.7 kg), SpO2 98 %. Body mass index is 35.01 kg/m.  General: Cooperative, alert, well developed, in no acute distress. HEENT: Conjunctivae and lids unremarkable. Cardiovascular: Regular rhythm.  Lungs: Normal work of breathing. Neurologic: No focal deficits.   Lab Results  Component Value Date   CREATININE 1.08 12/05/2020   BUN 12 12/05/2020   NA 138 12/05/2020   K 4.1 12/05/2020   CL 101 12/05/2020   CO2 21 12/05/2020  Lab Results  Component Value Date   ALT 19 12/05/2020   AST 17 12/05/2020   ALKPHOS 75 12/05/2020   BILITOT 0.5 12/05/2020   Lab Results  Component Value Date   HGBA1C 5.6 12/05/2020   HGBA1C 5.8 (H) 07/12/2020   HGBA1C 5.1 06/26/2014   Lab Results  Component Value Date   INSULIN 28.2 (H) 12/05/2020   INSULIN 26.1 (H) 07/12/2020   Lab Results  Component Value Date   TSH 1.410 07/12/2020   Lab Results  Component Value Date    CHOL 184 12/05/2020   HDL 39 (L) 12/05/2020   LDLCALC 129 (H) 12/05/2020   TRIG 85 12/05/2020   Lab Results  Component Value Date   WBC 14.1 (H) 07/24/2020   HGB 14.9 07/24/2020   HCT 45.2 07/24/2020   MCV 85.0 07/24/2020   PLT 443 (H) 07/24/2020   No results found for: IRON, TIBC, FERRITIN  Attestation Statements:   Reviewed by clinician on day of visit: allergies, medications, problem list, medical history, surgical history, family history, social history, and previous encounter notes.   Trude Mcburney, am acting as Energy manager for [place], FNP-C.  I have reviewed the above documentation for accuracy and completeness, and I agree with the above. -  Jesse Sans, FNP

## 2021-01-09 ENCOUNTER — Encounter: Payer: Self-pay | Admitting: Neurology

## 2021-01-09 ENCOUNTER — Ambulatory Visit (INDEPENDENT_AMBULATORY_CARE_PROVIDER_SITE_OTHER): Payer: 59 | Admitting: Neurology

## 2021-01-09 VITALS — BP 110/70 | HR 72 | Ht 70.0 in | Wt 247.5 lb

## 2021-01-09 DIAGNOSIS — Z79899 Other long term (current) drug therapy: Secondary | ICD-10-CM

## 2021-01-09 DIAGNOSIS — R208 Other disturbances of skin sensation: Secondary | ICD-10-CM | POA: Insufficient documentation

## 2021-01-09 DIAGNOSIS — R269 Unspecified abnormalities of gait and mobility: Secondary | ICD-10-CM

## 2021-01-09 DIAGNOSIS — G35 Multiple sclerosis: Secondary | ICD-10-CM | POA: Diagnosis not present

## 2021-01-09 MED ORDER — GABAPENTIN 300 MG PO CAPS: 300.0000 mg | ORAL_CAPSULE | Freq: Three times a day (TID) | ORAL | 11 refills | Status: DC

## 2021-01-09 NOTE — Progress Notes (Signed)
[facility]  PATIENT: Cody Rodriguez DOB: January 15, 1986  REFERRING DOCTOR OR PCP:  Hamilton Capri, DO SOURCE: Patient, notes from recent hospital admission, imaging and laboratory reports, MRI images personally reviewed.  _________________________________   HISTORICAL  CHIEF COMPLAINT:  Chief Complaint  Patient presents with  . Follow-up    RM 13, alone. Last seen 08/02/2020. On Ocrevus for MS. Last infusion: 09/17/20 and next infusion 04/01/21 at 730am (gave him appt card reminder from [facility] while here). No new sx. Infusion is going well. Legs still feel heavy. Would like to discuss.     HISTORY OF PRESENT ILLNESS:  Cody Rodriguez is a 35 y.o. man with  relapsing remitting multiple sclerosis.  Update 01/09/2021: He started Ocrevus (last infusion 09/17/2020; next infusion 04/01/21).  He tolerates it well and has no exacerbation.    No issues with side effects.   He denies any   Currently, he is walking better and could easily walk a mile though he is not pre-MS baseline.   He no longer uses the cane.   The left leg feels tight and has a burning tingling. He notes it most when resting or in bed though he notes it while walking as well.  Compression socks did not help.   He  is not weak as before.     He no longer has diplopia.   He denies any problems with bladder function.      He notes that the fatigue improved.   He is sleeping well.  He denies issues with mood or cognition.    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    He takes Vit D supplements.    MS History: He presented to [facility] emergency room 07/20/2020 with reduced balance, poor gait, headache, blurry vision, slurred speech and weakness for 2 days.   The left leg was more clumsy and weak than the right. He needed a walker.   Arms were strong.    He had diplopia and his fiance noted his eyes were not conjugate.   He had no nystagmus.   Monocular vision was fine.  MRI of the brain  showed changes consistent with MS and he was admitted and received 5 days of IV Solu-Medrol.  In retrospect, he had noted several episodes of poor balance, dizziness or weakness but he would improve after a few days to a week or two so did not seek medical help over the past 3 years.   He had an episode with abdominal numbness x 3-4 weeks about 2-3 years ago.   He had some balance issues and also felt weakness in the arms and legs that persisted.   He had not had vision problems until the past couple weeks.    He has no family history of MS.  07/12/2020 labs:  Vitamin D was 11.6 , mild hyperlipidemia.  11/25/2019 he had rhabdomyolysis after exercising while dehydrated.     IMAGING MRI of the brain 07/20/2020 shows multiple T2/FLAIR hyperintense foci in the hemispheres including some in the periventricular white matter, radially oriented to the ventricles.  There is a large focus in the right corona radiata and centrum semiovale with restricted diffusion and some rim enhancement  MRI of the cervical spine 07/21/2020 shows T2 hyperintense foci at C2-C3, C4, C5 and C6-C7.  There is some enhancement in the anterior spinal cord at C3 consistent with an acute focus.  MRI of the thoracic spine 07/21/2020 shows T2 hyperintense  foci centrally at T4 and T9-T10  MRI of the lumbar spine 05/12/2020 shows facet hypertrophy and ligamenta flava hypertrophy at L4-L5 with some encroachment upon the left L5 nerve root in the lateral recess.   REVIEW OF SYSTEMS: Constitutional: No fevers, chills, sweats, or change in appetite Eyes: No visual changes, double vision, eye pain Ear, nose and throat: No hearing loss, ear pain, nasal congestion, sore throat Cardiovascular: No chest pain, palpitations Respiratory: No shortness of breath at rest or with exertion.   No wheezes GastrointestinaI: No nausea, vomiting, diarrhea, abdominal pain, fecal incontinence Genitourinary: No dysuria, urinary retention or frequency.   No nocturia. Musculoskeletal: No neck pain, back pain Integumentary: No rash, pruritus, skin lesions Neurological: as above Psychiatric: No depression at this time.  No anxiety Endocrine: No palpitations, diaphoresis, change in appetite, change in weigh or increased thirst Hematologic/Lymphatic: No anemia, purpura, petechiae. Allergic/Immunologic: No itchy/runny eyes, nasal congestion, recent allergic reactions, rashes  ALLERGIES: No Known Allergies  HOME MEDICATIONS:  Current Outpatient Medications:  .  acetaminophen (TYLENOL) 325 MG tablet, Take 650 mg by mouth every 6 (six) hours as needed for mild pain, fever or headache., Disp: , Rfl:  .  amLODipine (NORVASC) 5 MG tablet, TAKE 1 TABLET (5 MG TOTAL) BY MOUTH DAILY., Disp: 30 tablet, Rfl: 0 .  gabapentin (NEURONTIN) 300 MG capsule, Take 1 capsule (300 mg total) by mouth 3 (three) times daily., Disp: 90 capsule, Rfl: 11 .  Multiple Vitamin (MULTIVITAMIN) capsule, Take 1 capsule by mouth daily., Disp: , Rfl:  .  ocrelizumab (OCREVUS) 300 MG/10ML injection, Inject into the vein once. Initial dose: 300mg  IV on day 1 and day 15 Subsequent dose: 600mg  IV q 6 months, Disp: , Rfl:  .  Vitamin D, Ergocalciferol, (DRISDOL) 1.25 MG (50000 UNIT) CAPS capsule, Take 1 capsule (50,000 Units total) by mouth every 7 (seven) days., Disp: 4 capsule, Rfl: 0  PAST MEDICAL HISTORY: Past Medical History:  Diagnosis Date  . Gynecomastia, male 07/02/2014  . High blood pressure   . Low back pain   . Multiple sclerosis (HCC) 07/21/2020  . Paresthesia and pain of both upper extremities    also in right leg  . Rhabdomyolysis 11/24/2019  . Swelling     PAST SURGICAL HISTORY: Past Surgical History:  Procedure Laterality Date  . FINGER SURGERY Left    left pinky  . ROTATOR CUFF REPAIR Left     FAMILY HISTORY: Family History  Problem Relation Age of Onset  . Healthy Mother   . Diabetes Mother   . Hypertension Mother   . Hyperlipidemia Mother   .  Sleep apnea Mother   . Hypertension Father   . Hyperlipidemia Father   . Healthy Sister   . Healthy Brother   . Diabetes Maternal Aunt   . Neuropathy Neg Hx   . Multiple sclerosis Neg Hx     SOCIAL HISTORY:  Social History   Socioeconomic History  . Marital status: Single    Spouse name: Not on file  . Number of children: 0  . Years of education: 77  . Highest education level: Not on file  Occupational History  . Occupation: 11/26/2019: [employer]  Tobacco Use  . Smoking status: Former Smoker    Packs/day: 0.50    Years: 10.00    Pack years: 5.00    Types: Cigarettes  . Smokeless tobacco: Never Used  . Tobacco comment: 10/25/15 cut back a little  Substance and Sexual Activity  .  Alcohol use: Not Currently    Alcohol/week: 0.0 standard drinks  . Drug use: No  . Sexual activity: Not on file  Other Topics Concern  . Not on file  Social History Narrative   Right handed   No caffeine    He also eats chocolate occasionally.   Social Determinants of Health   Financial Resource Strain: Not on file  Food Insecurity: Not on file  Transportation Needs: Not on file  Physical Activity: Not on file  Stress: Not on file  Social Connections: Not on file  Intimate Partner Violence: Not on file     PHYSICAL EXAM  Vitals:   01/09/21 1050  BP: 110/70  Pulse: 72  SpO2: 99%  Weight: 247 lb 8 oz (112.3 kg)  Height: 5\' 10"  (1.778 m)    Body mass index is 35.51 kg/m.   General: The patient is well-developed and well-nourished and in no acute distress  HEENT:  Head is [place]/AT.  Sclera are anicteric.   Skin: Extremities are without rash or  edema.   Neurologic Exam  Mental status: The patient is alert and oriented x 3 at the time of the examination. The patient has apparent normal recent and remote memory, with an apparently normal attention span and concentration ability.   Speech is normal.  Cranial nerves: Extraocular movements are full. Facial  strength and sensation are normal. No obvious hearing deficits are noted.  Motor:  Muscle bulk is normal.   Tone is normal. Strength is  5 / 5 now including left foot. RAM now symmetric.    Sensory: Sensory testing is intact to  touch and vibration sensation in all 4 extremities.  Coordination: Cerebellar testing reveals normal finger-nose-finger and heel-to-shin .  Gait and station: Station is normal.   Gait is normal but tandem is wide   Romberg is positive.   Reflexes: Deep tendon reflexes are symmetric and increased in left kegrelative to right.   Plantar responses are flexor      DIAGNOSTIC DATA (LABS, IMAGING, TESTING) - I reviewed patient records, labs, notes, testing and imaging myself where available.  Lab Results  Component Value Date   WBC 14.1 (H) 07/24/2020   HGB 14.9 07/24/2020   HCT 45.2 07/24/2020   MCV 85.0 07/24/2020   PLT 443 (H) 07/24/2020      Component Value Date/Time   NA 138 12/05/2020 1508   K 4.1 12/05/2020 1508   CL 101 12/05/2020 1508   CO2 21 12/05/2020 1508   GLUCOSE 74 12/05/2020 1508   GLUCOSE 158 (H) 07/24/2020 1023   BUN 12 12/05/2020 1508   CREATININE 1.08 12/05/2020 1508   CREATININE 1.16 06/26/2014 1402   CALCIUM 9.3 12/05/2020 1508   PROT 7.2 12/05/2020 1508   ALBUMIN 4.3 12/05/2020 1508   AST 17 12/05/2020 1508   ALT 19 12/05/2020 1508   ALKPHOS 75 12/05/2020 1508   BILITOT 0.5 12/05/2020 1508   GFRNONAA >60 07/24/2020 1023   GFRNONAA 85 06/26/2014 1402   GFRAA 93 07/12/2020 1220   GFRAA >89 06/26/2014 1402   Lab Results  Component Value Date   CHOL 184 12/05/2020   HDL 39 (L) 12/05/2020   LDLCALC 129 (H) 12/05/2020   TRIG 85 12/05/2020   Lab Results  Component Value Date   HGBA1C 5.6 12/05/2020   Lab Results  Component Value Date   VITAMINB12 1,217 07/12/2020   Lab Results  Component Value Date   TSH 1.410 07/12/2020  ASSESSMENT AND PLAN  Multiple sclerosis (HCC)  High risk medication  use  Gait disturbance  Dysesthesia    1.  Continue Ocrevus.   At next visit we will check labs.  Check MRI around time of next visit to assess for subclinical activity.   2.  Stay active 3.   Gabapentin 300 mg o tid for dysesthesia   Bonetta Mostek A. Epimenio Foot, MD, [facility] 01/09/2021, 11:30 AM Certified in Neurology, Clinical Neurophysiology, Sleep Medicine and Neuroimaging  [facility] [address], [address] [place], [place] [address] [phone]

## 2021-01-10 ENCOUNTER — Encounter (INDEPENDENT_AMBULATORY_CARE_PROVIDER_SITE_OTHER): Payer: Self-pay | Admitting: Family Medicine

## 2021-01-10 ENCOUNTER — Ambulatory Visit: Payer: Self-pay | Admitting: Neurology

## 2021-01-15 ENCOUNTER — Other Ambulatory Visit (INDEPENDENT_AMBULATORY_CARE_PROVIDER_SITE_OTHER): Payer: Self-pay | Admitting: Family Medicine

## 2021-01-15 DIAGNOSIS — E559 Vitamin D deficiency, unspecified: Secondary | ICD-10-CM

## 2021-01-15 NOTE — Telephone Encounter (Signed)
Last seen Dawn 

## 2021-01-24 ENCOUNTER — Other Ambulatory Visit: Payer: Self-pay

## 2021-01-24 ENCOUNTER — Encounter (INDEPENDENT_AMBULATORY_CARE_PROVIDER_SITE_OTHER): Payer: Self-pay | Admitting: Family Medicine

## 2021-01-24 ENCOUNTER — Ambulatory Visit (INDEPENDENT_AMBULATORY_CARE_PROVIDER_SITE_OTHER): Payer: 59 | Admitting: Family Medicine

## 2021-01-24 VITALS — BP 136/81 | HR 102 | Temp 97.9°F | Ht 70.0 in | Wt 246.0 lb

## 2021-01-24 DIAGNOSIS — R7303 Prediabetes: Secondary | ICD-10-CM | POA: Diagnosis not present

## 2021-01-24 DIAGNOSIS — Z9189 Other specified personal risk factors, not elsewhere classified: Secondary | ICD-10-CM

## 2021-01-24 DIAGNOSIS — Z6836 Body mass index (BMI) 36.0-36.9, adult: Secondary | ICD-10-CM | POA: Diagnosis not present

## 2021-01-24 DIAGNOSIS — E559 Vitamin D deficiency, unspecified: Secondary | ICD-10-CM | POA: Diagnosis not present

## 2021-01-24 MED ORDER — VITAMIN D (ERGOCALCIFEROL) 1.25 MG (50000 UNIT) PO CAPS
50000.0000 [IU] | ORAL_CAPSULE | ORAL | 0 refills | Status: DC
Start: 2021-01-24 — End: 2021-03-18

## 2021-01-24 MED ORDER — METFORMIN HCL 500 MG PO TABS: 500.0000 mg | ORAL_TABLET | Freq: Every day | ORAL | 0 refills | Status: DC

## 2021-01-28 NOTE — Progress Notes (Signed)
Chief Complaint:   OBESITY Cody Rodriguez is here to discuss his progress with his obesity treatment plan along with follow-up of his obesity related diagnoses. Cody Rodriguez is on the Category 3 Plan +100 calories and states he is following his eating plan approximately 70-75% of the time. Cody Rodriguez states he is walking for 30 minutes 4-5 times per week.  Today's visit was #: 13 Starting weight: 255 lbs Starting date: 10/09/2020 Today's weight: 246 lbs Today's date: 01/24/2021 Total lbs lost to date: 9 lbs Total lbs lost since last in-office visit: +2  Interim History: Cody Rodriguez was put on gabapentin by his neurologist.  This has increased his hunger.  He also has sweets cravings and has been indulging.  He is on plan at meals for the most part.  Subjective:   1. Pre-diabetes Cody Rodriguez endorses hunger.  He also notes sweets cravings.  Last A1c was 5.6.  Lab Results  Component Value Date   HGBA1C 5.6 12/05/2020   Lab Results  Component Value Date   INSULIN 28.2 (H) 12/05/2020   INSULIN 26.1 (H) 07/12/2020   2. Vitamin D deficiency Vitamin D low at 36.1.  On weekly prescription vitamin D.  3. At risk for side effect of medication Cody Rodriguez is at risk for side effect of medication due to taking metformin.  Assessment/Plan:   1. Pre-diabetes New prescription provided for metformin 500 mg daily, a per below.  - Start metFORMIN (GLUCOPHAGE) 500 MG tablet; Take 1 tablet (500 mg total) by mouth daily with breakfast.  Dispense: 30 tablet; Refill: 0  2. Vitamin D deficiency Refill vitamin D 50,000 IU weekly, as per below.  - Refill Vitamin D, Ergocalciferol, (DRISDOL) 1.25 MG (50000 UNIT) CAPS capsule; Take 1 capsule (50,000 Units total) by mouth every 7 (seven) days.  Dispense: 4 capsule; Refill: 0  3. At risk for side effect of medication Cody Rodriguez was given approximately 15 minutes of drug side effect counseling today.  We discussed side effect possibility and risk versus benefits. Cody Rodriguez  agreed to the medication and will contact this office if these side effects are intolerable.  Repetitive spaced learning was employed today to elicit superior memory formation and behavioral change.  4. Obesity: BMI 35.3  Cody Rodriguez is currently in the action stage of change. As such, his goal is to continue with weight loss efforts. He has agreed to the Category 3 Plan.   Exercise goals: Add resistance 2 times per week.  Behavioral modification strategies: increasing lean protein intake and decreasing simple carbohydrates.  Cody Rodriguez has agreed to follow-up with our clinic in 4 weeks.   Objective:   Blood pressure 136/81, pulse (!) 102, temperature 97.9 F (36.6 C), height 5\' 10"  (1.778 m), weight 246 lb (111.6 kg), SpO2 100 %. Body mass index is 35.3 kg/m.  General: Cooperative, alert, well developed, in no acute distress. HEENT: Conjunctivae and lids unremarkable. Cardiovascular: Regular rhythm.  Lungs: Normal work of breathing. Neurologic: No focal deficits.   Lab Results  Component Value Date   CREATININE 1.08 12/05/2020   BUN 12 12/05/2020   NA 138 12/05/2020   K 4.1 12/05/2020   CL 101 12/05/2020   CO2 21 12/05/2020   Lab Results  Component Value Date   ALT 19 12/05/2020   AST 17 12/05/2020   ALKPHOS 75 12/05/2020   BILITOT 0.5 12/05/2020   Lab Results  Component Value Date   HGBA1C 5.6 12/05/2020   HGBA1C 5.8 (H) 07/12/2020   HGBA1C 5.1 06/26/2014   Lab  Results  Component Value Date   INSULIN 28.2 (H) 12/05/2020   INSULIN 26.1 (H) 07/12/2020   Lab Results  Component Value Date   TSH 1.410 07/12/2020   Lab Results  Component Value Date   CHOL 184 12/05/2020   HDL 39 (L) 12/05/2020   LDLCALC 129 (H) 12/05/2020   TRIG 85 12/05/2020   Lab Results  Component Value Date   WBC 14.1 (H) 07/24/2020   HGB 14.9 07/24/2020   HCT 45.2 07/24/2020   MCV 85.0 07/24/2020   PLT 443 (H) 07/24/2020   Attestation Statements:   Reviewed by clinician on day of  visit: allergies, medications, problem list, medical history, surgical history, family history, social history, and previous encounter notes.  I, Insurance claims handler, CMA, am acting as Energy manager for [place], FNP.  I have reviewed the above documentation for accuracy and completeness, and I agree with the above. -  Jesse Sans, FNP

## 2021-01-29 ENCOUNTER — Encounter (INDEPENDENT_AMBULATORY_CARE_PROVIDER_SITE_OTHER): Payer: Self-pay | Admitting: Family Medicine

## 2021-02-21 ENCOUNTER — Encounter (INDEPENDENT_AMBULATORY_CARE_PROVIDER_SITE_OTHER): Payer: Self-pay | Admitting: Family Medicine

## 2021-02-21 ENCOUNTER — Other Ambulatory Visit: Payer: Self-pay

## 2021-02-21 ENCOUNTER — Ambulatory Visit (INDEPENDENT_AMBULATORY_CARE_PROVIDER_SITE_OTHER): Payer: 59 | Admitting: Family Medicine

## 2021-02-21 VITALS — BP 129/77 | HR 76 | Temp 97.6°F | Ht 70.0 in | Wt 245.0 lb

## 2021-02-21 DIAGNOSIS — R7303 Prediabetes: Secondary | ICD-10-CM | POA: Diagnosis not present

## 2021-02-21 DIAGNOSIS — Z6836 Body mass index (BMI) 36.0-36.9, adult: Secondary | ICD-10-CM | POA: Diagnosis not present

## 2021-02-26 ENCOUNTER — Encounter (INDEPENDENT_AMBULATORY_CARE_PROVIDER_SITE_OTHER): Payer: Self-pay | Admitting: Family Medicine

## 2021-02-26 NOTE — Progress Notes (Signed)
Chief Complaint:   OBESITY Cody Rodriguez is here to discuss his progress with his obesity treatment plan along with follow-up of his obesity related diagnoses. Cody Rodriguez is on the Category 3 Plan and states he is following his eating plan approximately 60% of the time. Cody Rodriguez states he is walking/strength training for 30-60 minutes 2-7 times per week.  Today's visit was #: 14 Starting weight: 255 lbs Starting date: 10/09/2020 Today's weight: 245 lbs Today's date: 02/21/2021 Total lbs lost to date: 10 lbs Total lbs lost since last in-office visit: 1 lb  Interim History: Cody Rodriguez is skipping lunch at times due to lack of appetite due to heat he works in the heat  He avoids sugar-sweetened beverages.  He used to drink Dr. Reino Kent quite often.  He added resistance training since last office visit.  He denies any issues at this time.    Subjective:   1. Pre-diabetes He started metformin after his last office visit.  Tolerating metformin well.  Last A1c was 5.6 (7 months ago).  He feels metformin helps with cravings a bit.  Lab Results  Component Value Date   HGBA1C 5.6 12/05/2020   Lab Results  Component Value Date   INSULIN 28.2 (H) 12/05/2020   INSULIN 26.1 (H) 07/12/2020   Assessment/Plan:   1. Pre-diabetes Continue metformin.  2. Obesity: BMI 35.15  Cody Rodriguez is currently in the action stage of change. As such, his goal is to continue with weight loss efforts. He has agreed to the Category 3 Plan.   He will try to eat lunch if he is able.  Exercise goals: As is.  Behavioral modification strategies: increasing lean protein intake and no skipping meals.  Cody Rodriguez has agreed to follow-up with our clinic in 3 weeks with Dr. Sharee Holster or Alois Cliche, PA-C.   Objective:   Blood pressure 129/77, pulse 76, temperature 97.6 F (36.4 C), temperature source Oral, height 5\' 10"  (1.778 m), weight 245 lb (111.1 kg), SpO2 96 %. Body mass index is 35.15 kg/m.  General: Cooperative,  alert, well developed, in no acute distress. HEENT: Conjunctivae and lids unremarkable. Cardiovascular: Regular rhythm.  Lungs: Normal work of breathing. Neurologic: No focal deficits.   Lab Results  Component Value Date   CREATININE 1.08 12/05/2020   BUN 12 12/05/2020   NA 138 12/05/2020   K 4.1 12/05/2020   CL 101 12/05/2020   CO2 21 12/05/2020   Lab Results  Component Value Date   ALT 19 12/05/2020   AST 17 12/05/2020   ALKPHOS 75 12/05/2020   BILITOT 0.5 12/05/2020   Lab Results  Component Value Date   HGBA1C 5.6 12/05/2020   HGBA1C 5.8 (H) 07/12/2020   HGBA1C 5.1 06/26/2014   Lab Results  Component Value Date   INSULIN 28.2 (H) 12/05/2020   INSULIN 26.1 (H) 07/12/2020   Lab Results  Component Value Date   TSH 1.410 07/12/2020   Lab Results  Component Value Date   CHOL 184 12/05/2020   HDL 39 (L) 12/05/2020   LDLCALC 129 (H) 12/05/2020   TRIG 85 12/05/2020   Lab Results  Component Value Date   WBC 14.1 (H) 07/24/2020   HGB 14.9 07/24/2020   HCT 45.2 07/24/2020   MCV 85.0 07/24/2020   PLT 443 (H) 07/24/2020   Attestation Statements:   Reviewed by clinician on day of visit: allergies, medications, problem list, medical history, surgical history, family history, social history, and previous encounter notes.  I, 07/26/2020, CMA, am acting  as Energy manager for [place], FNP.  I have reviewed the above documentation for accuracy and completeness, and I agree with the above. -  Jesse Sans, FNP

## 2021-03-18 ENCOUNTER — Ambulatory Visit (INDEPENDENT_AMBULATORY_CARE_PROVIDER_SITE_OTHER): Payer: 59 | Admitting: Family Medicine

## 2021-03-18 ENCOUNTER — Encounter (INDEPENDENT_AMBULATORY_CARE_PROVIDER_SITE_OTHER): Payer: Self-pay | Admitting: Family Medicine

## 2021-03-18 ENCOUNTER — Other Ambulatory Visit: Payer: Self-pay

## 2021-03-18 VITALS — BP 133/83 | HR 86 | Temp 98.2°F | Ht 70.0 in | Wt 243.0 lb

## 2021-03-18 DIAGNOSIS — R7303 Prediabetes: Secondary | ICD-10-CM

## 2021-03-18 DIAGNOSIS — E669 Obesity, unspecified: Secondary | ICD-10-CM | POA: Diagnosis not present

## 2021-03-18 DIAGNOSIS — Z9189 Other specified personal risk factors, not elsewhere classified: Secondary | ICD-10-CM

## 2021-03-18 DIAGNOSIS — Z6834 Body mass index (BMI) 34.0-34.9, adult: Secondary | ICD-10-CM

## 2021-03-18 DIAGNOSIS — E559 Vitamin D deficiency, unspecified: Secondary | ICD-10-CM | POA: Diagnosis not present

## 2021-03-18 MED ORDER — METFORMIN HCL 500 MG PO TABS: 500.0000 mg | ORAL_TABLET | Freq: Two times a day (BID) | ORAL | 0 refills | Status: DC

## 2021-03-18 MED ORDER — VITAMIN D (ERGOCALCIFEROL) 1.25 MG (50000 UNIT) PO CAPS: 50000.0000 [IU] | ORAL_CAPSULE | ORAL | 0 refills | Status: DC

## 2021-03-27 NOTE — Progress Notes (Signed)
Chief Complaint:   OBESITY Cody Rodriguez is here to discuss his progress with his obesity treatment plan along with follow-up of his obesity related diagnoses.   Today's visit was #: 15 Starting weight: 255 lbs Starting date: 10/09/2020 Today's weight: 243 lbs Today's date: 6-13/2022 Weight change since last visit: 2 lbs Total lbs lost to date: 12 lbs Body mass index is 34.87 kg/m.  Total weight loss percentage to date: -4.71%  Interim History:  Cody Rodriguez is here for a follow up office visit and he is following the meal plan without concerns or issues.  Patient's meal and food recall appears to be accurate and consistent with what is on the plan.  When on plan, his hunger and cravings are well controlled.    Current Meal Plan: the Category 3 Plan for 50% of the time.   Current Exercise Plan: Walking/weights for 30 minutes 5 times per week.   Assessment/Plan:   Medications Discontinued During This Encounter  Medication Reason   metFORMIN (GLUCOPHAGE) 500 MG tablet Reorder   Vitamin D, Ergocalciferol, (DRISDOL) 1.25 MG (50000 UNIT) CAPS capsule Reorder    Meds ordered this encounter  Medications   Vitamin D, Ergocalciferol, (DRISDOL) 1.25 MG (50000 UNIT) CAPS capsule    Sig: Take 1 capsule (50,000 Units total) by mouth every 7 (seven) days.    Dispense:  4 capsule    Refill:  0   metFORMIN (GLUCOPHAGE) 500 MG tablet    Sig: Take 1 tablet (500 mg total) by mouth 2 (two) times daily with a meal.    Dispense:  60 tablet    Refill:  0    Ov for rf    1. Pre-diabetes At goal. Goal is HgbA1c < 5.7.  Medication: metformin 500 mg twice daily.  Due to increased hunger/carb cravings in the late afternoons, increase metformin to twice daily.    Plan:  Refill and increase dose of metformin to 500 mg twice daily  He will continue to focus on protein-rich, low simple carbohydrate foods. We reviewed the importance of hydration, regular exercise for stress reduction, and restorative  sleep.   Lab Results  Component Value Date   HGBA1C 5.6 12/05/2020   Lab Results  Component Value Date   INSULIN 28.2 (H) 12/05/2020   INSULIN 26.1 (H) 07/12/2020   - Refill and increase metformin (GLUCOPHAGE) 500 MG tablet; Take 1 tablet (500 mg total) by mouth 2 (two) times daily with a meal.  Dispense: 60 tablet; Refill: 0  2. Vitamin D deficiency Not at goal. Current vitamin D is 36.1, tested on 12/05/2020. Optimal goal > 50 ng/dL.   Plan: Continue to take prescription Vitamin D @50 ,000 IU every week as prescribed.  Follow-up for routine testing of Vitamin D, at least 2-3 times per year to avoid over-replacement.  - Refill Vitamin D, Ergocalciferol, (DRISDOL) 1.25 MG (50000 UNIT) CAPS capsule; Take 1 capsule (50,000 Units total) by mouth every 7 (seven) days.  Dispense: 4 capsule; Refill: 0  3. At risk for side effect of medication Due to Cody Rodriguez's current conditions and medications, he is at a higher risk for drug side effect.  At least 9 minutes was spent on counseling him about these concerns today.  We discussed the benefits and potential risks of these medications, and all of patient's concerns were addressed and questions were answered.  he will call , or their PCP or other specialists who treat their conditions with medications, with any questions or concerns that may  develop.     4. Class 1 obesity with serious comorbidity and body mass index (BMI) of 34.0 to 34.9 in adult, unspecified obesity type  Course: Cody Rodriguez is currently in the action stage of change. As such, his goal is to continue with weight loss efforts.   Nutrition goals: He has agreed to the Category 3 Plan.   Exercise goals:  As is.  Behavioral modification strategies: increasing lean protein intake, decreasing simple carbohydrates, and avoiding temptations.  Cody Rodriguez has agreed to follow-up with our clinic in 2-3 weeks. He was informed of the importance of frequent follow-up visits to maximize his success  with intensive lifestyle modifications for his multiple health conditions.   Objective:   Blood pressure 133/83, pulse 86, temperature 98.2 F (36.8 C), height 5\' 10"  (1.778 m), weight 243 lb (110.2 kg), SpO2 99 %. Body mass index is 34.87 kg/m.  General: Cooperative, alert, well developed, in no acute distress. HEENT: Conjunctivae and lids unremarkable. Cardiovascular: Regular rhythm.  Lungs: Normal work of breathing. Neurologic: No focal deficits.   Lab Results  Component Value Date   CREATININE 1.08 12/05/2020   BUN 12 12/05/2020   NA 138 12/05/2020   K 4.1 12/05/2020   CL 101 12/05/2020   CO2 21 12/05/2020   Lab Results  Component Value Date   ALT 19 12/05/2020   AST 17 12/05/2020   ALKPHOS 75 12/05/2020   BILITOT 0.5 12/05/2020   Lab Results  Component Value Date   HGBA1C 5.6 12/05/2020   HGBA1C 5.8 (H) 07/12/2020   HGBA1C 5.1 06/26/2014   Lab Results  Component Value Date   INSULIN 28.2 (H) 12/05/2020   INSULIN 26.1 (H) 07/12/2020   Lab Results  Component Value Date   TSH 1.410 07/12/2020   Lab Results  Component Value Date   CHOL 184 12/05/2020   HDL 39 (L) 12/05/2020   LDLCALC 129 (H) 12/05/2020   TRIG 85 12/05/2020   Lab Results  Component Value Date   WBC 14.1 (H) 07/24/2020   HGB 14.9 07/24/2020   HCT 45.2 07/24/2020   MCV 85.0 07/24/2020   PLT 443 (H) 07/24/2020   Attestation Statements:   Reviewed by clinician on day of visit: allergies, medications, problem list, medical history, surgical history, family history, social history, and previous encounter notes.  I, 07/26/2020, CMA, am acting as Insurance claims handler for Energy manager, DO.  I have reviewed the above documentation for accuracy and completeness, and I agree with the above. -[employer], D.O.  The 21st Century Cures Act was signed into law in 2016 which includes the topic of electronic health records.  This provides immediate access to information in MyChart.  This  includes consultation notes, operative notes, office notes, lab results and pathology reports.  If you have any questions about what you read please let 2017 know at your next visit so we can discuss your concerns and take corrective action if need be.  We are right here with you.

## 2021-04-11 ENCOUNTER — Encounter (INDEPENDENT_AMBULATORY_CARE_PROVIDER_SITE_OTHER): Payer: Self-pay

## 2021-04-11 ENCOUNTER — Ambulatory Visit (INDEPENDENT_AMBULATORY_CARE_PROVIDER_SITE_OTHER): Payer: 59 | Admitting: Family Medicine

## 2021-06-20 ENCOUNTER — Ambulatory Visit (INDEPENDENT_AMBULATORY_CARE_PROVIDER_SITE_OTHER): Payer: 59 | Admitting: Neurology

## 2021-06-20 ENCOUNTER — Encounter: Payer: Self-pay | Admitting: Neurology

## 2021-06-20 VITALS — BP 139/95 | HR 75 | Ht 70.0 in | Wt 255.0 lb

## 2021-06-20 DIAGNOSIS — G35 Multiple sclerosis: Secondary | ICD-10-CM

## 2021-06-20 DIAGNOSIS — Z79899 Other long term (current) drug therapy: Secondary | ICD-10-CM | POA: Diagnosis not present

## 2021-06-20 DIAGNOSIS — R531 Weakness: Secondary | ICD-10-CM

## 2021-06-20 DIAGNOSIS — E559 Vitamin D deficiency, unspecified: Secondary | ICD-10-CM | POA: Diagnosis not present

## 2021-06-20 DIAGNOSIS — G35D Multiple sclerosis, unspecified: Secondary | ICD-10-CM

## 2021-06-20 DIAGNOSIS — R270 Ataxia, unspecified: Secondary | ICD-10-CM

## 2021-06-20 MED ORDER — VITAMIN D (ERGOCALCIFEROL) 1.25 MG (50000 UNIT) PO CAPS: 50000.0000 [IU] | ORAL_CAPSULE | ORAL | 3 refills | Status: DC

## 2021-06-20 MED ORDER — AMLODIPINE BESYLATE 5 MG PO TABS: 5.0000 mg | ORAL_TABLET | Freq: Every day | ORAL | 3 refills | Status: AC

## 2021-06-20 NOTE — Progress Notes (Addendum)
[facility]  PATIENT: Cody Rodriguez DOB: [birth year]  REFERRING DOCTOR OR PCP:  Hamilton Capri, DO SOURCE: Patient, notes from recent hospital admission, imaging and laboratory reports, MRI images personally reviewed.  _________________________________   HISTORICAL  CHIEF COMPLAINT:  Chief Complaint  Patient presents with   Follow-up    Rm 1, alone. Here for 5 month MS f/u, on Ocrevus. No new or worsening in sx.      HISTORY OF PRESENT ILLNESS:  Cody Rodriguez is a 35 y.o. man with  relapsing remitting multiple sclerosis.  Update 06/20/2021: He started Ocrevus (last infusion 09/17/2020; next infusion 04/01/21).  He tolerates it well and has no exacerbation.    No issues with side effects.   He denies any   Currently, gait is doing well.   He is not completely baseline.   He still has some tingling and numbness in the left leg.   At times the left leg feels we.  He doe snot need to use the cane.   He denies any diplopia or other visual symptoms   He denies any problems with bladder function.      He notes that the fatigue improved and it is mild now   He is sleeping well.  He denies issues with mood or cognition.      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    He was taking Vit D supplements.  He ran out.  He would prefer there weekly vitamin D2 OTC supplements  MS History: He presented to [facility] room 07/20/2020 with reduced balance, poor gait, headache, blurry vision, slurred speech and weakness for 2 days.   The left leg was more clumsy and weak than the right. He needed a walker.   Arms were strong.    He had diplopia and his fiance noted his eyes were not conjugate.   He had no nystagmus.   Monocular vision was fine.  MRI of the brain showed changes consistent with MS and he was admitted and received 5 days of IV Solu-Medrol.  In retrospect, he had noted several episodes of poor balance, dizziness or weakness but he  would improve after a few days to a week or two so did not seek medical help over the past 3 years.   He had an episode with abdominal numbness x 3-4 weeks about 2-3 years ago.   He had some balance issues and also felt weakness in the arms and legs that persisted.   He had not had vision problems until the past couple weeks.    He has no family history of MS.  07/12/2020 labs:  Vitamin D was 11.6 , mild hyperlipidemia.  11/25/2019 he had rhabdomyolysis after exercising while dehydrated.     IMAGING MRI of the brain 07/20/2020 shows multiple T2/FLAIR hyperintense foci in the hemispheres including some in the periventricular white matter, radially oriented to the ventricles.  There is a large focus in the right corona radiata and centrum semiovale with restricted diffusion and some rim enhancement  MRI of the cervical spine 07/21/2020 shows T2 hyperintense foci at C2-C3, C4, C5 and C6-C7.  There is some enhancement in the anterior spinal cord at C3 consistent with an acute focus.  MRI of the thoracic spine 07/21/2020 shows T2 hyperintense foci centrally at T4 and T9-T10  MRI of the lumbar spine 05/12/2020 shows facet hypertrophy and ligamenta flava hypertrophy at L4-L5 with some encroachment upon the left L5 nerve  root in the lateral recess.   REVIEW OF SYSTEMS: Constitutional: No fevers, chills, sweats, or change in appetite Eyes: No visual changes, double vision, eye pain Ear, nose and throat: No hearing loss, ear pain, nasal congestion, sore throat Cardiovascular: No chest pain, palpitations Respiratory:  No shortness of breath at rest or with exertion.   No wheezes GastrointestinaI: No nausea, vomiting, diarrhea, abdominal pain, fecal incontinence Genitourinary:  No dysuria, urinary retention or frequency.  No nocturia. Musculoskeletal:  No neck pain, back pain Integumentary: No rash, pruritus, skin lesions Neurological: as above Psychiatric: No depression at this time.  No  anxiety Endocrine: No palpitations, diaphoresis, change in appetite, change in weigh or increased thirst Hematologic/Lymphatic:  No anemia, purpura, petechiae. Allergic/Immunologic: No itchy/runny eyes, nasal congestion, recent allergic reactions, rashes  ALLERGIES: No Known Allergies  HOME MEDICATIONS:  Current Outpatient Medications:    acetaminophen (TYLENOL) 325 MG tablet, Take 650 mg by mouth every 6 (six) hours as needed for mild pain, fever or headache., Disp: , Rfl:    metFORMIN (GLUCOPHAGE) 500 MG tablet, Take 1 tablet (500 mg total) by mouth 2 (two) times daily with a meal., Disp: 60 tablet, Rfl: 0   Multiple Vitamin (MULTIVITAMIN) capsule, Take 1 capsule by mouth daily., Disp: , Rfl:    ocrelizumab (OCREVUS) 300 MG/10ML injection, Inject into the vein once. Initial dose: 300mg  IV on day 1 and day 15 Subsequent dose: 600mg  IV q 6 months, Disp: , Rfl:    amLODipine (NORVASC) 5 MG tablet, Take 1 tablet (5 mg total) by mouth daily., Disp: 90 tablet, Rfl: 3   Vitamin D, Ergocalciferol, (DRISDOL) 1.25 MG (50000 UNIT) CAPS capsule, Take 1 capsule (50,000 Units total) by mouth every 7 (seven) days., Disp: 13 capsule, Rfl: 3  PAST MEDICAL HISTORY: Past Medical History:  Diagnosis Date   Gynecomastia, male 07/02/2014   High blood pressure    Low back pain    Multiple sclerosis (HCC) 07/21/2020   Paresthesia and pain of both upper extremities    also in right leg   Rhabdomyolysis 11/24/2019   Swelling     PAST SURGICAL HISTORY: Past Surgical History:  Procedure Laterality Date   FINGER SURGERY Left    left pinky   ROTATOR CUFF REPAIR Left     FAMILY HISTORY: Family History  Problem Relation Age of Onset   Healthy Mother    Diabetes Mother    Hypertension Mother    Hyperlipidemia Mother    Sleep apnea Mother    Hypertension Father    Hyperlipidemia Father    Healthy Sister    Healthy Brother    Diabetes Maternal Aunt    Neuropathy Neg Hx    Multiple sclerosis Neg Hx      SOCIAL HISTORY:  Social History   Socioeconomic History   Marital status: Single    Spouse name: Not on file   Number of children: 0   Years of education: 12   Highest education level: Not on file  Occupational History   Occupation: 07/23/2020: [employer]  Tobacco Use   Smoking status: Former    Packs/day: 0.50    Years: 10.00    Pack years: 5.00    Types: Cigarettes   Smokeless tobacco: Never   Tobacco comments:    10/25/15 cut back a little  Substance and Sexual Activity   Alcohol use: Not Currently    Alcohol/week: 0.0 standard drinks   Drug use: No   Sexual activity: Not  on file  Other Topics Concern   Not on file  Social History Narrative   Right handed   No caffeine    He also eats chocolate occasionally.   Social Determinants of Health   Financial Resource Strain: Not on file  Food Insecurity: Not on file  Transportation Needs: Not on file  Physical Activity: Not on file  Stress: Not on file  Social Connections: Not on file  Intimate Partner Violence: Not on file     PHYSICAL EXAM  Vitals:   06/20/21 1111  BP: (!) 139/95  Pulse: 75  Weight: 255 lb (115.7 kg)  Height: 5\' 10"  (1.778 m)     Body mass index is 36.59 kg/m.   General: The patient is well-developed and well-nourished and in no acute distress  HEENT:  Head is [place]/AT.  Sclera are anicteric.   Skin: Extremities are without rash or  edema.   Neurologic Exam  Mental status: The patient is alert and oriented x 3 at the time of the examination. The patient has apparent normal recent and remote memory, with an apparently normal attention span and concentration ability.   Speech is normal.  Cranial nerves: Extraocular movements are full. Facial strength and sensation are normal. No obvious hearing deficits are noted.  Motor:  Muscle bulk is normal.   Tone is normal. Strength is  5 / 5 now including left foot. RAM now symmetric.    Sensory: Sensory testing is intact  to  touch and vibration sensation in all 4 extremities.  Coordination: Cerebellar testing reveals normal finger-nose-finger and heel-to-shin .  Gait and station: Station is normal.   Gait is normal but tandem is slightly wide   Romberg is now borderline.   Reflexes: Deep tendon reflexes are symmetric and increased in left knee relative to right.   Plantar responses are flexor      DIAGNOSTIC DATA (LABS, IMAGING, TESTING) - I reviewed patient records, labs, notes, testing and imaging myself where available.  Lab Results  Component Value Date   WBC 14.1 (H) 07/24/2020   HGB 14.9 07/24/2020   HCT 45.2 07/24/2020   MCV 85.0 07/24/2020   PLT 443 (H) 07/24/2020      Component Value Date/Time   NA 138 12/05/2020 1508   K 4.1 12/05/2020 1508   CL 101 12/05/2020 1508   CO2 21 12/05/2020 1508   GLUCOSE 74 12/05/2020 1508   GLUCOSE 158 (H) 07/24/2020 1023   BUN 12 12/05/2020 1508   CREATININE 1.08 12/05/2020 1508   CREATININE 1.16 06/26/2014 1402   CALCIUM 9.3 12/05/2020 1508   PROT 7.2 12/05/2020 1508   ALBUMIN 4.3 12/05/2020 1508   AST 17 12/05/2020 1508   ALT 19 12/05/2020 1508   ALKPHOS 75 12/05/2020 1508   BILITOT 0.5 12/05/2020 1508   GFRNONAA >60 07/24/2020 1023   GFRNONAA 85 06/26/2014 1402   GFRAA 93 07/12/2020 1220   GFRAA >89 06/26/2014 1402   Lab Results  Component Value Date   CHOL 184 12/05/2020   HDL 39 (L) 12/05/2020   LDLCALC 129 (H) 12/05/2020   TRIG 85 12/05/2020   Lab Results  Component Value Date   HGBA1C 5.6 12/05/2020   Lab Results  Component Value Date   VITAMINB12 1,217 07/12/2020   Lab Results  Component Value Date   TSH 1.410 07/12/2020       ASSESSMENT AND PLAN  Multiple sclerosis (HCC) - Plan: IgG, IgA, IgM, CBC with Differential/Platelet, MR BRAIN W WO CONTRAST  Vitamin D deficiency - Plan: Vitamin D, Ergocalciferol, (DRISDOL) 1.25 MG (50000 UNIT) CAPS capsule, VITAMIN D 25 Hydroxy (Vit-D Deficiency, Fractures)  Left-sided  weakness  High risk medication use - Plan: IgG, IgA, IgM, CBC with Differential/Platelet  Ataxia - Plan: MR BRAIN W WO CONTRAST    1.  He will continue Ocrevus.   Check IgG/IgM and CBC with differential.  Check MRI a of the brain to assess for subclinical activity.  If this is occurring we would need to consider a different disease modifying therapy 2.  Stay active 3.   Renew vitamin D and amlodipine.  He is advised to follow-up with primary care due to his hypertension and prediabetes. 4.    Return in 6 months or sooner if there are new or worsening neurologic symptoms  Kajal Scalici A. Epimenio Foot, MD, [facility] 06/20/2021, 11:53 AM Certified in Neurology, Clinical Neurophysiology, Sleep Medicine and Neuroimaging  [facility] [address], [address] [place], [place] [address] [phone]

## 2021-06-21 LAB — IGG, IGA, IGM
IgA/Immunoglobulin A, Serum: 283 mg/dL (ref 90–386)
IgG (Immunoglobin G), Serum: 1112 mg/dL (ref 603–1613)
IgM (Immunoglobulin M), Srm: 28 mg/dL (ref 20–172)

## 2021-06-21 LAB — CBC WITH DIFFERENTIAL/PLATELET
Basophils Absolute: 0.1 10*3/uL (ref 0.0–0.2)
Basos: 1 %
EOS (ABSOLUTE): 0.8 10*3/uL — ABNORMAL HIGH (ref 0.0–0.4)
Eos: 15 %
Hematocrit: 43.7 % (ref 37.5–51.0)
Hemoglobin: 14.1 g/dL (ref 13.0–17.7)
Immature Grans (Abs): 0 10*3/uL (ref 0.0–0.1)
Immature Granulocytes: 0 %
Lymphocytes Absolute: 1.7 10*3/uL (ref 0.7–3.1)
Lymphs: 32 %
MCH: 27.4 pg (ref 26.6–33.0)
MCHC: 32.3 g/dL (ref 31.5–35.7)
MCV: 85 fL (ref 79–97)
Monocytes Absolute: 0.5 10*3/uL (ref 0.1–0.9)
Monocytes: 8 %
Neutrophils Absolute: 2.4 10*3/uL (ref 1.4–7.0)
Neutrophils: 44 %
Platelets: 376 10*3/uL (ref 150–450)
RBC: 5.14 x10E6/uL (ref 4.14–5.80)
RDW: 16.1 % — ABNORMAL HIGH (ref 11.6–15.4)
WBC: 5.4 10*3/uL (ref 3.4–10.8)

## 2021-06-21 LAB — VITAMIN D 25 HYDROXY (VIT D DEFICIENCY, FRACTURES): Vit D, 25-Hydroxy: 27.1 ng/mL — ABNORMAL LOW (ref 30.0–100.0)

## 2021-06-27 ENCOUNTER — Telehealth: Payer: Self-pay | Admitting: Neurology

## 2021-06-27 NOTE — Telephone Encounter (Signed)
MR Brain w/wo contrast Dr. Epimenio Foot [facility] Berkley Harvey: NPR via [facility] website. Patient is scheduled at [facility] for 07/03/21.

## 2021-07-02 ENCOUNTER — Encounter: Payer: Self-pay | Admitting: Neurology

## 2021-07-03 ENCOUNTER — Ambulatory Visit (INDEPENDENT_AMBULATORY_CARE_PROVIDER_SITE_OTHER): Payer: 59

## 2021-07-03 DIAGNOSIS — G35 Multiple sclerosis: Secondary | ICD-10-CM

## 2021-07-03 DIAGNOSIS — R270 Ataxia, unspecified: Secondary | ICD-10-CM | POA: Diagnosis not present

## 2021-07-03 MED ORDER — GADOBENATE DIMEGLUMINE 529 MG/ML IV SOLN
20.0000 mL | Freq: Once | INTRAVENOUS | Status: AC | PRN
  Administered 2021-07-03: 20 mL via INTRAVENOUS

## 2021-07-08 ENCOUNTER — Telehealth: Payer: Self-pay | Admitting: *Deleted

## 2021-07-08 NOTE — Telephone Encounter (Signed)
-----   Message from Asa Lente, MD sent at 07/05/2021 10:42 AM EDT ----- Please let him know:   The MRI of the brain looked okay.  It shows the old MS lesions but there are no new lesions so the medication seems to be doing a good job.   There was a little bit of sinusitis.  If there are sinusitis symptoms we can call in a Z-Pak.

## 2021-07-08 NOTE — Telephone Encounter (Signed)
Called and spoke w/ pt about results per Dr. Bonnita Hollow note. Pt verbalized understanding.  He denies any sinus issues. He will continue to monitor. If he develops any sx, he will f/u with PCP or ENT.

## 2021-09-20 ENCOUNTER — Other Ambulatory Visit: Payer: Self-pay

## 2022-05-14 ENCOUNTER — Encounter (INDEPENDENT_AMBULATORY_CARE_PROVIDER_SITE_OTHER): Payer: Self-pay

## 2022-06-11 ENCOUNTER — Other Ambulatory Visit: Payer: Self-pay | Admitting: Neurology

## 2022-07-07 ENCOUNTER — Other Ambulatory Visit: Payer: Self-pay | Admitting: Neurology

## 2022-07-07 DIAGNOSIS — E559 Vitamin D deficiency, unspecified: Secondary | ICD-10-CM

## 2022-11-05 ENCOUNTER — Telehealth: Payer: Self-pay | Admitting: Neurology

## 2022-11-05 NOTE — Telephone Encounter (Signed)
Pt called and scheduled appt.

## 2022-11-05 NOTE — Telephone Encounter (Signed)
Received staff message from [place], [place] that pt is overdue and needs to schedule a f/u with Dr. Felecia Shelling. I left a voicemail asking pt to give [place] a call to schedule f/u.

## 2022-12-22 ENCOUNTER — Encounter: Payer: Self-pay | Admitting: Neurology

## 2022-12-22 ENCOUNTER — Ambulatory Visit (INDEPENDENT_AMBULATORY_CARE_PROVIDER_SITE_OTHER): Payer: 59 | Admitting: Neurology

## 2022-12-22 VITALS — BP 147/94 | HR 82 | Ht 70.0 in | Wt 250.4 lb

## 2022-12-22 DIAGNOSIS — G35 Multiple sclerosis: Secondary | ICD-10-CM

## 2022-12-22 DIAGNOSIS — Z79899 Other long term (current) drug therapy: Secondary | ICD-10-CM

## 2022-12-22 NOTE — Progress Notes (Signed)
[facility]  PATIENT: Cody Rodriguez DOB: September 17, 1986  REFERRING DOCTOR OR PCP:  Rikki Spearing, DO SOURCE: Patient, notes from recent hospital admission, imaging and laboratory reports, MRI images personally reviewed.  _________________________________   HISTORICAL  CHIEF COMPLAINT:  Chief Complaint  Patient presents with   Follow-up    Rm 1, alone. Here for 5 month MS f/u, on Ocrevus. No new or worsening in sx.      HISTORY OF PRESENT ILLNESS:  Cody Rodriguez is a 37 y.o. man with  relapsing remitting multiple sclerosis.  Update 12/22/2022: He started Ocrevus (last infusion 10/2022).  Next infusion is in July.  He tolerates it well and has no exacerbation.    No issues with side effects.   He gets itching sensations at times but not associated with the infusions.  Currently, gait is doing well.  He can go up and down stairs well.    He has stumbled some but no falls.  This has happened most the last few weeks of each cycle.He still has some tingling and numbness in the left leg.    He denies any diplopia or other visual symptoms   He denies any problems with bladder function.      He notes that the fatigue improved and is worse the last month of each cycle.     He is sleeping well.  He denies issues with mood or cognition.      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    He was taking Vit D supplements.  He would prefer there weekly vitamin D2 OTC supplements  He has DM controlled with metformin.  MS History: He presented to [facility] emergency room 07/20/2020 with reduced balance, poor gait, headache, blurry vision, slurred speech and weakness for 2 days.   The left leg was more clumsy and weak than the right. He needed a walker.   Arms were strong.    He had diplopia and his fiance noted his eyes were not conjugate.   He had no nystagmus.   Monocular vision was fine.  MRI of the brain showed changes consistent with MS and he was  admitted and received 5 days of IV Solu-Medrol.  In retrospect, he had noted several episodes of poor balance, dizziness or weakness but he would improve after a few days to a week or two so did not seek medical help over the past 3 years.   He had an episode with abdominal numbness x 3-4 weeks about 2-3 years ago.   He had some balance issues and also felt weakness in the arms and legs that persisted.   He had not had vision problems until the past couple weeks.    He has no family history of MS.  07/12/2020 labs:  Vitamin D was 11.6 , mild hyperlipidemia.  11/25/2019 he had rhabdomyolysis after exercising while dehydrated.     IMAGING MRI of the brain 07/20/2020 shows multiple T2/FLAIR hyperintense foci in the hemispheres including some in the periventricular white matter, radially oriented to the ventricles.  There is a large focus in the right corona radiata and centrum semiovale with restricted diffusion and some rim enhancement  MRI of the cervical spine 07/21/2020 shows T2 hyperintense foci at C2-C3, C4, C5 and C6-C7.  There is some enhancement in the anterior spinal cord at C3 consistent with an acute focus.  MRI of the thoracic spine 07/21/2020 shows T2 hyperintense foci centrally at T4 and T9-T10  MRI of the lumbar spine 05/12/2020 shows facet hypertrophy and ligamenta flava hypertrophy at L4-L5 with some encroachment upon the left L5 nerve root in the lateral recess.   REVIEW OF SYSTEMS: Constitutional: No fevers, chills, sweats, or change in appetite Eyes: No visual changes, double vision, eye pain Ear, nose and throat: No hearing loss, ear pain, nasal congestion, sore throat Cardiovascular: No chest pain, palpitations Respiratory:  No shortness of breath at rest or with exertion.   No wheezes GastrointestinaI: No nausea, vomiting, diarrhea, abdominal pain, fecal incontinence Genitourinary:  No dysuria, urinary retention or frequency.  No nocturia. Musculoskeletal:  No neck pain,  back pain Integumentary: No rash, pruritus, skin lesions Neurological: as above Psychiatric: No depression at this time.  No anxiety Endocrine: No palpitations, diaphoresis, change in appetite, change in weigh or increased thirst Hematologic/Lymphatic:  No anemia, purpura, petechiae. Allergic/Immunologic: No itchy/runny eyes, nasal congestion, recent allergic reactions, rashes  ALLERGIES: No Known Allergies  HOME MEDICATIONS:  Current Outpatient Medications:    acetaminophen (TYLENOL) 325 MG tablet, Take 650 mg by mouth every 6 (six) hours as needed for mild pain, fever or headache., Disp: , Rfl:    metFORMIN (GLUCOPHAGE) 500 MG tablet, Take 1 tablet (500 mg total) by mouth 2 (two) times daily with a meal., Disp: 60 tablet, Rfl: 0   Multiple Vitamin (MULTIVITAMIN) capsule, Take 1 capsule by mouth daily., Disp: , Rfl:    ocrelizumab (OCREVUS) 300 MG/10ML injection, Inject into the vein once. Initial dose: 300mg  IV on day 1 and day 15 Subsequent dose: 600mg  IV q 6 months, Disp: , Rfl:    amLODipine (NORVASC) 5 MG tablet, Take 1 tablet (5 mg total) by mouth daily., Disp: 90 tablet, Rfl: 3   Vitamin D, Ergocalciferol, (DRISDOL) 1.25 MG (50000 UNIT) CAPS capsule, Take 1 capsule (50,000 Units total) by mouth every 7 (seven) days., Disp: 13 capsule, Rfl: 3  PAST MEDICAL HISTORY: Past Medical History:  Diagnosis Date   Gynecomastia, male 07/02/2014   High blood pressure    Low back pain    Multiple sclerosis ([place]) 07/21/2020   Paresthesia and pain of both upper extremities    also in right leg   Rhabdomyolysis 11/24/2019   Swelling     PAST SURGICAL HISTORY: Past Surgical History:  Procedure Laterality Date   FINGER SURGERY Left    left pinky   ROTATOR CUFF REPAIR Left     FAMILY HISTORY: Family History  Problem Relation Age of Onset   Healthy Mother    Diabetes Mother    Hypertension Mother    Hyperlipidemia Mother    Sleep apnea Mother    Hypertension Father     Hyperlipidemia Father    Healthy Sister    Healthy Brother    Diabetes Maternal Aunt    Neuropathy Neg Hx    Multiple sclerosis Neg Hx     SOCIAL HISTORY:  Social History   Socioeconomic History   Marital status: Single    Spouse name: Not on file   Number of children: 0   Years of education: 12   Highest education level: Not on file  Occupational History   Occupation: Comptroller: [employer]  Tobacco Use   Smoking status: Former    Packs/day: 0.50    Years: 10.00    Pack years: 5.00    Types: Cigarettes   Smokeless tobacco: Never   Tobacco comments:    10/25/15 cut back a little  Substance and Sexual Activity  Alcohol use: Not Currently    Alcohol/week: 0.0 standard drinks   Drug use: No   Sexual activity: Not on file  Other Topics Concern   Not on file  Social History Narrative   Right handed   No caffeine    He also eats chocolate occasionally.   Social Determinants of Health   Financial Resource Strain: Not on file  Food Insecurity: Not on file  Transportation Needs: Not on file  Physical Activity: Not on file  Stress: Not on file  Social Connections: Not on file  Intimate Partner Violence: Not on file     PHYSICAL EXAM  Vitals:   06/20/21 1111  BP: (!) 139/95  Pulse: 75  Weight: 255 lb (115.7 kg)  Height: 5\' 10"  (1.778 m)     Body mass index is 36.59 kg/m.   General: The patient is well-developed and well-nourished and in no acute distress  HEENT:  Head is [place]/AT.  Sclera are anicteric.   Skin: Extremities are without rash or  edema.   Neurologic Exam  Mental status: The patient is alert and oriented x 3 at the time of the examination. The patient has apparent normal recent and remote memory, with an apparently normal attention span and concentration ability.   Speech is normal.  Cranial nerves: Extraocular movements are full. Facial strength and sensation are normal. No obvious hearing deficits are noted.  Motor:   Muscle bulk is normal.   Tone is normal. Strength is  5 / 5 now including left foot. Marland Kitchen RAM now symmetric.    Sensory: Sensory testing is intact to  touch and vibration sensation in all 4 extremities.  Coordination: Cerebellar testing reveals normal finger-nose-finger and heel-to-shin .  Gait and station: Station is normal.   Gait is normal but tandem is slightly wide   Romberg is now borderline.   Reflexes: Deep tendon reflexes are symmetric and increased in left knee relative to right.   Plantar responses are flexor      DIAGNOSTIC DATA (LABS, IMAGING, TESTING) - I reviewed patient records, labs, notes, testing and imaging myself where available.  Lab Results  Component Value Date   WBC 14.1 (H) 07/24/2020   HGB 14.9 07/24/2020   HCT 45.2 07/24/2020   MCV 85.0 07/24/2020   PLT 443 (H) 07/24/2020      Component Value Date/Time   NA 138 12/05/2020 1508   K 4.1 12/05/2020 1508   CL 101 12/05/2020 1508   CO2 21 12/05/2020 1508   GLUCOSE 74 12/05/2020 1508   GLUCOSE 158 (H) 07/24/2020 1023   BUN 12 12/05/2020 1508   CREATININE 1.08 12/05/2020 1508   CREATININE 1.16 06/26/2014 1402   CALCIUM 9.3 12/05/2020 1508   PROT 7.2 12/05/2020 1508   ALBUMIN 4.3 12/05/2020 1508   AST 17 12/05/2020 1508   ALT 19 12/05/2020 1508   ALKPHOS 75 12/05/2020 1508   BILITOT 0.5 12/05/2020 1508   GFRNONAA >60 07/24/2020 1023   GFRNONAA 85 06/26/2014 1402   GFRAA 93 07/12/2020 1220   GFRAA >89 06/26/2014 1402   Lab Results  Component Value Date   CHOL 184 12/05/2020   HDL 39 (L) 12/05/2020   LDLCALC 129 (H) 12/05/2020   TRIG 85 12/05/2020   Lab Results  Component Value Date   HGBA1C 5.6 12/05/2020   Lab Results  Component Value Date   VITAMINB12 1,217 07/12/2020   Lab Results  Component Value Date   TSH 1.410 07/12/2020  ASSESSMENT AND PLAN  Multiple sclerosis ([place]) - Plan: IgG, IgA, IgM, CBC with Differential/Platelet, MR BRAIN W WO CONTRAST  Vitamin D deficiency  - Plan: Vitamin D, Ergocalciferol, (DRISDOL) 1.25 MG (50000 UNIT) CAPS capsule, VITAMIN D 25 Hydroxy (Vit-D Deficiency, Fractures)  Left-sided weakness  High risk medication use - Plan: IgG, IgA, IgM, CBC with Differential/Platelet  Ataxia - Plan: MR BRAIN W WO CONTRAST    1.  He will continue Ocrevus.   Check IgG/IgM and CBC with differential.  Check MRI of the brain and cervical spine to assess for subclinical activity.  If this is occurring we would need to consider a different disease modifying therapy 2.  Stay active.  Exercise as tolerated 3.    Check vitamin D and supplement if needed  4.    Return in 6 months or sooner if there are new or worsening neurologic symptoms  Cleva Camero A. Felecia Shelling, MD, [facility] [id number], [id number] AM Certified in Neurology, Clinical Neurophysiology, Sleep Medicine and Neuroimaging  [facility] [address] [place], [place] [address] [phone]

## 2022-12-23 ENCOUNTER — Other Ambulatory Visit: Payer: Self-pay | Admitting: Neurology

## 2022-12-23 LAB — CBC WITH DIFFERENTIAL/PLATELET
Basophils Absolute: 0.1 10*3/uL (ref 0.0–0.2)
Basos: 1 %
EOS (ABSOLUTE): 0.2 10*3/uL (ref 0.0–0.4)
Eos: 3 %
Hematocrit: 43.9 % (ref 37.5–51.0)
Hemoglobin: 14.5 g/dL (ref 13.0–17.7)
Immature Grans (Abs): 0 10*3/uL (ref 0.0–0.1)
Immature Granulocytes: 0 %
Lymphocytes Absolute: 2.1 10*3/uL (ref 0.7–3.1)
Lymphs: 30 %
MCH: 28.1 pg (ref 26.6–33.0)
MCHC: 33 g/dL (ref 31.5–35.7)
MCV: 85 fL (ref 79–97)
Monocytes Absolute: 0.9 10*3/uL (ref 0.1–0.9)
Monocytes: 14 %
Neutrophils Absolute: 3.6 10*3/uL (ref 1.4–7.0)
Neutrophils: 52 %
Platelets: 360 10*3/uL (ref 150–450)
RBC: 5.16 x10E6/uL (ref 4.14–5.80)
RDW: 15.7 % — ABNORMAL HIGH (ref 11.6–15.4)
WBC: 6.9 10*3/uL (ref 3.4–10.8)

## 2022-12-23 LAB — IGG, IGA, IGM
IgA/Immunoglobulin A, Serum: 251 mg/dL (ref 90–386)
IgG (Immunoglobin G), Serum: 962 mg/dL (ref 603–1613)
IgM (Immunoglobulin M), Srm: 16 mg/dL — ABNORMAL LOW (ref 20–172)

## 2022-12-23 LAB — VITAMIN D 25 HYDROXY (VIT D DEFICIENCY, FRACTURES): Vit D, 25-Hydroxy: 20.4 ng/mL — ABNORMAL LOW (ref 30.0–100.0)

## 2022-12-23 MED ORDER — VITAMIN D (ERGOCALCIFEROL) 1.25 MG (50000 UNIT) PO CAPS: 50000.0000 [IU] | ORAL_CAPSULE | ORAL | 1 refills | Status: DC

## 2022-12-24 ENCOUNTER — Telehealth: Payer: Self-pay | Admitting: Neurology

## 2022-12-24 NOTE — Telephone Encounter (Signed)
Pt scheduled for MR brain wo and MR cervical wo contrast at [place] for 12/31/22 at [place] case# [id number]

## 2022-12-31 ENCOUNTER — Ambulatory Visit (INDEPENDENT_AMBULATORY_CARE_PROVIDER_SITE_OTHER): Payer: 59

## 2022-12-31 DIAGNOSIS — G35 Multiple sclerosis: Secondary | ICD-10-CM | POA: Diagnosis not present

## 2023-06-11 ENCOUNTER — Ambulatory Visit (INDEPENDENT_AMBULATORY_CARE_PROVIDER_SITE_OTHER): Payer: 59 | Admitting: Neurology

## 2023-06-11 ENCOUNTER — Encounter: Payer: Self-pay | Admitting: Neurology

## 2023-06-11 VITALS — BP 153/105 | HR 85 | Ht 70.0 in | Wt 253.1 lb

## 2023-06-11 DIAGNOSIS — E559 Vitamin D deficiency, unspecified: Secondary | ICD-10-CM

## 2023-06-11 DIAGNOSIS — R270 Ataxia, unspecified: Secondary | ICD-10-CM

## 2023-06-11 DIAGNOSIS — G35 Multiple sclerosis: Secondary | ICD-10-CM

## 2023-06-11 DIAGNOSIS — Z79899 Other long term (current) drug therapy: Secondary | ICD-10-CM | POA: Diagnosis not present

## 2023-06-11 MED ORDER — PREDNISONE 20 MG PO TABS: ORAL_TABLET | ORAL | 1 refills | Status: AC

## 2023-06-11 MED ORDER — VITAMIN D (ERGOCALCIFEROL) 1.25 MG (50000 UNIT) PO CAPS: 50000.0000 [IU] | ORAL_CAPSULE | ORAL | 1 refills | Status: DC

## 2023-06-11 NOTE — Progress Notes (Signed)
[facility]  PATIENT: Cody Rodriguez DOB: [birth year]  REFERRING DOCTOR OR PCP:  Hamilton Capri, DO SOURCE: Patient, notes from recent hospital admission, imaging and laboratory reports, MRI images personally reviewed.  _________________________________   HISTORICAL  CHIEF COMPLAINT:  Chief Complaint  Patient presents with   Follow-up    Pt in room 10. Here for MS follow up. Pt on Ocrevus Pt reports doing well, no concerns.      HISTORY OF PRESENT ILLNESS:  Cody Rodriguez is a 37 y.o. man with  relapsing remitting multiple sclerosis.  Update 9/52024: He started Ocrevus (last infusion July/2024).  Next infusion is in July.  He tolerates it well and has no exacerbation.    No issues with side effects.   He gets itching sensations at times but not associated with the infusions.  Currently, gait is doing well.  He can go up and down stairs well.  He climbs ladders  He has stumbled some but no falls.  This has happened most the last few weeks of each cycle.He still has some tingling and numbness in the left leg.    He denies any diplopia or other visual symptoms   He denies any problems with bladder function.      He notes that the fatigue improved and is worse the last month of each cycle.     He is sleeping well.  He denies issues with mood or cognition.      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  He would prefer there weekly vitamin D2 OTC supplements  He has a callous on his left foot.   He was on metformin for weight loss not DM in past (not now).    MS History: He presented to [facility] emergency room 07/20/2020 with reduced balance, poor gait, headache, blurry vision, slurred speech and weakness for 2 days.   The left leg was more clumsy and weak than the right. He needed a walker.   Arms were strong.    He had diplopia and his fiance noted his eyes were not conjugate.   He had no nystagmus.   Monocular vision was  fine.  MRI of the brain showed changes consistent with MS and he was admitted and received 5 days of IV Solu-Medrol.  In retrospect, he had noted several episodes of poor balance, dizziness or weakness but he would improve after a few days to a week or two so did not seek medical help over the past 3 years.   He had an episode with abdominal numbness x 3-4 weeks about 2-3 years ago.   He had some balance issues and also felt weakness in the arms and legs that persisted.   He had not had vision problems until the past couple weeks.    He has no family history of MS.  07/12/2020 labs:  Vitamin D was 11.6 , mild hyperlipidemia.  11/25/2019 he had rhabdomyolysis after exercising while dehydrated.     IMAGING MRI of the brain 07/20/2020 shows multiple T2/FLAIR hyperintense foci in the hemispheres including some in the periventricular white matter, radially oriented to the ventricles.  There is a large focus in the right corona radiata and centrum semiovale with restricted diffusion and some rim enhancement  MRI of the cervical spine 07/21/2020 shows T2 hyperintense foci at C2-C3, C4, C5 and C6-C7.  There is some enhancement in the anterior spinal cord at C3 consistent with an  acute focus.  MRI of the thoracic spine 07/21/2020 shows T2 hyperintense foci centrally at T4 and T9-T10  MRI of the lumbar spine 05/12/2020 shows facet hypertrophy and ligamenta flava hypertrophy at L4-L5 with some encroachment upon the left L5 nerve root in the lateral recess.  MRI brain 12/31/2022 showed Multiple T2/FLAIR hyperintense foci in the hemispheres, predominantly in the periventricular white matter with subtle foci in the right middle cerebellar peduncle and upper cervical spinal cord. None of the foci appear to be acute. There are no new lesions compared to 07/03/2021 MRI.   MRI cervical spine 12/31/2022 T2 hyperintense foci within the spinal cord centrally adjacent to C2, adjacent to C2-C3 (On sagittal images not seen on  axial views), posteriorly adjacent to C4, anteriorly to the left adjacent to C5. None of these appear to be acute. There are no new lesions compared to the 07/21/2020 MRI   REVIEW OF SYSTEMS: Constitutional: No fevers, chills, sweats, or change in appetite Eyes: No visual changes, double vision, eye pain Ear, nose and throat: No hearing loss, ear pain, nasal congestion, sore throat Cardiovascular: No chest pain, palpitations Respiratory:  No shortness of breath at rest or with exertion.   No wheezes GastrointestinaI: No nausea, vomiting, diarrhea, abdominal pain, fecal incontinence Genitourinary:  No dysuria, urinary retention or frequency.  No nocturia. Musculoskeletal:  No neck pain, back pain Integumentary: No rash, pruritus, skin lesions Neurological: as above Psychiatric: No depression at this time.  No anxiety Endocrine: No palpitations, diaphoresis, change in appetite, change in weigh or increased thirst Hematologic/Lymphatic:  No anemia, purpura, petechiae. Allergic/Immunologic: No itchy/runny eyes, nasal congestion, recent allergic reactions, rashes  ALLERGIES: No Known Allergies  HOME MEDICATIONS:  Current Outpatient Medications:    acetaminophen (TYLENOL) 325 MG tablet, Take 650 mg by mouth every 6 (six) hours as needed for mild pain, fever or headache., Disp: , Rfl:    lisinopril (ZESTRIL) 10 MG tablet, Take 10 mg by mouth in the morning., Disp: , Rfl:    ocrelizumab (OCREVUS) 300 MG/10ML injection, Inject into the vein once. Initial dose: 300mg  IV on day 1 and day 15 Subsequent dose: 600mg  IV q 6 months, Disp: , Rfl:    predniSONE (DELTASONE) 20 MG tablet, Take 3 pills (60 mg) po the day before your infusions, Disp: 9 tablet, Rfl: 1   Vitamin D, Ergocalciferol, (DRISDOL) 1.25 MG (50000 UNIT) CAPS capsule, Take 1 capsule (50,000 Units total) by mouth every 7 (seven) days., Disp: 13 capsule, Rfl: 1   Vitamin D, Ergocalciferol, (DRISDOL) 1.25 MG (50000 UNIT) CAPS capsule, Take  1 capsule (50,000 Units total) by mouth every 7 (seven) days., Disp: 13 capsule, Rfl: 1   amLODipine (NORVASC) 5 MG tablet, Take 1 tablet (5 mg total) by mouth daily., Disp: 90 tablet, Rfl: 3  PAST MEDICAL HISTORY: Past Medical History:  Diagnosis Date   Gynecomastia, male 07/02/2014   High blood pressure    Low back pain    Multiple sclerosis ([facility]) 07/21/2020   Paresthesia and pain of both upper extremities    also in right leg   Rhabdomyolysis 11/24/2019   Swelling     PAST SURGICAL HISTORY: Past Surgical History:  Procedure Laterality Date   FINGER SURGERY Left    left pinky   ROTATOR CUFF REPAIR Left     FAMILY HISTORY: Family History  Problem Relation Age of Onset   Healthy Mother    Diabetes Mother    Hypertension Mother    Hyperlipidemia Mother  Sleep apnea Mother    Hypertension Father    Hyperlipidemia Father    Healthy Sister    Healthy Brother    Diabetes Maternal Aunt    Neuropathy Neg Hx    Multiple sclerosis Neg Hx     SOCIAL HISTORY:  Social History   Socioeconomic History   Marital status: Single    Spouse name: Not on file   Number of children: 0   Years of education: 12   Highest education level: Not on file  Occupational History   Occupation: Clinical research associate: [employer]  Tobacco Use   Smoking status: Former    Current packs/day: 0.50    Average packs/day: 0.5 packs/day for 10.0 years (5.0 ttl pk-yrs)    Types: Cigarettes   Smokeless tobacco: Never   Tobacco comments:    10/25/15 cut back a little  Substance and Sexual Activity   Alcohol use: Not Currently    Alcohol/week: 0.0 standard drinks of alcohol   Drug use: No   Sexual activity: Not on file  Other Topics Concern   Not on file  Social History Narrative    Right handed   Caffeine use: sometimes   He also eats chocolate occasionally.   Social Determinants of Health   Financial Resource Strain: Not on file  Food Insecurity: Not on file  Transportation Needs: Not  on file  Physical Activity: Not on file  Stress: Not on file  Social Connections: Not on file  Intimate Partner Violence: Not on file     PHYSICAL EXAM  Vitals:   06/11/23 1505 06/11/23 1508  BP: (!) 157/95 (!) 153/105  Pulse: 95 85  Weight: 253 lb 0.8 oz (114.8 kg)   Height: 5\' 10"  (1.778 m)      Body mass index is 36.31 kg/m.   General: The patient is well-developed and well-nourished and in no acute distress  HEENT:  Head is [place]/AT.  Sclera are anicteric.   Skin: Extremities are without rash or  edema.   Callous left plantar foot near 3rd toe   Neurologic Exam  Mental status: The patient is alert and oriented x 3 at the time of the examination. The patient has apparent normal recent and remote memory, with an apparently normal attention span and concentration ability.   Speech is normal.  Cranial nerves: Extraocular movements are full. Facial strength and sensation are normal. No obvious hearing deficits are noted.  Motor:  Muscle bulk is normal.   Tone is normal. Strength is  5 / 5 now including left foot. Marland Kitchen RAM now symmetric.    Sensory: Sensory testing is intact to  touch and vibration sensation in all 4 extremities.  Coordination: Cerebellar testing reveals normal finger-nose-finger and heel-to-shin .  Gait and station: Station is normal.   Gait is normal.  Tandem gait is mildly wide.   Romberg is borderline.   Reflexes: Deep tendon reflexes are symmetric and increased in left knee relative to right.       DIAGNOSTIC DATA (LABS, IMAGING, TESTING) - I reviewed patient records, labs, notes, testing and imaging myself where available.  Lab Results  Component Value Date   WBC 6.9 12/22/2022   HGB 14.5 12/22/2022   HCT 43.9 12/22/2022   MCV 85 12/22/2022   PLT 360 12/22/2022      Component Value Date/Time   NA 138 12/05/2020 1508   K 4.1 12/05/2020 1508   CL 101 12/05/2020 1508   CO2 21 12/05/2020 1508  GLUCOSE 74 12/05/2020 1508   GLUCOSE 158 (H)  07/24/2020 1023   BUN 12 12/05/2020 1508   CREATININE 1.08 12/05/2020 1508   CREATININE 1.16 06/26/2014 1402   CALCIUM 9.3 12/05/2020 1508   PROT 7.2 12/05/2020 1508   ALBUMIN 4.3 12/05/2020 1508   AST 17 12/05/2020 1508   ALT 19 12/05/2020 1508   ALKPHOS 75 12/05/2020 1508   BILITOT 0.5 12/05/2020 1508   GFRNONAA >60 07/24/2020 1023   GFRNONAA 85 06/26/2014 1402   GFRAA 93 07/12/2020 1220   GFRAA >89 06/26/2014 1402   Lab Results  Component Value Date   CHOL 184 12/05/2020   HDL 39 (L) 12/05/2020   LDLCALC 129 (H) 12/05/2020   TRIG 85 12/05/2020   Lab Results  Component Value Date   HGBA1C 5.6 12/05/2020   Lab Results  Component Value Date   VITAMINB12 1,217 07/12/2020   Lab Results  Component Value Date   TSH 1.410 07/12/2020       ASSESSMENT AND PLAN  Multiple sclerosis ([facility]) - Plan: IgG, IgA, IgM, CBC with Differential/Platelet  High risk medication use - Plan: IgG, IgA, IgM, CBC with Differential/Platelet  Ataxia  Vitamin D deficiency    1.  He will continue Ocrevus.   Check IgG/IgM and CBC with differential.   2.  Stay active.  Exercise as tolerated 3.    Take  vitamin D supplement  4.    Return in 6 months or sooner if there are new or worsening neurologic symptoms  Jsoeph Podesta A. Epimenio Foot, MD, [facility] 06/11/2023, 3:36 PM Certified in Neurology, Clinical Neurophysiology, Sleep Medicine and Neuroimaging  [facility] [address], [address] [place], [place] [address] [phone]

## 2023-06-12 LAB — CBC WITH DIFFERENTIAL/PLATELET
Basophils Absolute: 0.1 10*3/uL (ref 0.0–0.2)
Basos: 1 %
EOS (ABSOLUTE): 0.2 10*3/uL (ref 0.0–0.4)
Eos: 3 %
Hematocrit: 43.9 % (ref 37.5–51.0)
Hemoglobin: 14.9 g/dL (ref 13.0–17.7)
Immature Grans (Abs): 0 10*3/uL (ref 0.0–0.1)
Immature Granulocytes: 0 %
Lymphocytes Absolute: 2.4 10*3/uL (ref 0.7–3.1)
Lymphs: 32 %
MCH: 28.5 pg (ref 26.6–33.0)
MCHC: 33.9 g/dL (ref 31.5–35.7)
MCV: 84 fL (ref 79–97)
Monocytes Absolute: 0.9 10*3/uL (ref 0.1–0.9)
Monocytes: 12 %
Neutrophils Absolute: 4 10*3/uL (ref 1.4–7.0)
Neutrophils: 52 %
Platelets: 382 10*3/uL (ref 150–450)
RBC: 5.22 x10E6/uL (ref 4.14–5.80)
RDW: 15.4 % (ref 11.6–15.4)
WBC: 7.6 10*3/uL (ref 3.4–10.8)

## 2023-06-12 LAB — IGG, IGA, IGM
IgA/Immunoglobulin A, Serum: 224 mg/dL (ref 90–386)
IgG (Immunoglobin G), Serum: 974 mg/dL (ref 603–1613)
IgM (Immunoglobulin M), Srm: 13 mg/dL — ABNORMAL LOW (ref 20–172)

## 2023-07-16 ENCOUNTER — Telehealth: Payer: Self-pay

## 2023-07-16 NOTE — Telephone Encounter (Signed)
Faxed Ocrevus Start Form to [phone] on 07/16/2023

## 2024-03-31 ENCOUNTER — Telehealth: Payer: Self-pay | Admitting: *Deleted

## 2024-03-31 ENCOUNTER — Telehealth: Payer: Self-pay | Admitting: Neurology

## 2024-03-31 ENCOUNTER — Ambulatory Visit (INDEPENDENT_AMBULATORY_CARE_PROVIDER_SITE_OTHER): Admitting: Neurology

## 2024-03-31 ENCOUNTER — Encounter: Payer: Self-pay | Admitting: Neurology

## 2024-03-31 VITALS — BP 159/94 | HR 80 | Ht 70.0 in | Wt 255.5 lb

## 2024-03-31 DIAGNOSIS — E559 Vitamin D deficiency, unspecified: Secondary | ICD-10-CM

## 2024-03-31 DIAGNOSIS — G35 Multiple sclerosis: Secondary | ICD-10-CM | POA: Diagnosis not present

## 2024-03-31 DIAGNOSIS — R531 Weakness: Secondary | ICD-10-CM | POA: Diagnosis not present

## 2024-03-31 DIAGNOSIS — G35A Relapsing-remitting multiple sclerosis: Secondary | ICD-10-CM

## 2024-03-31 DIAGNOSIS — Z79899 Other long term (current) drug therapy: Secondary | ICD-10-CM | POA: Diagnosis not present

## 2024-03-31 DIAGNOSIS — G35D Multiple sclerosis, unspecified: Secondary | ICD-10-CM

## 2024-03-31 MED ORDER — VITAMIN D (ERGOCALCIFEROL) 1.25 MG (50000 UNIT) PO CAPS: 50000.0000 [IU] | ORAL_CAPSULE | ORAL | 3 refills | Status: AC

## 2024-03-31 NOTE — Telephone Encounter (Signed)
 Faxed Lab orders to [facility] at [phone] on 03/31/2024

## 2024-03-31 NOTE — Telephone Encounter (Signed)
 Pt called to report he could not wait any longer for labs, he had to return to work, call connected to CMA

## 2024-03-31 NOTE — Progress Notes (Addendum)
 "  [facility]  PATIENT: Cody Rodriguez DOB: [birth year]  REFERRING DOCTOR OR PCP:  Jaynie Daniels, DO SOURCE: Patient, notes from recent hospital admission, imaging and laboratory reports, MRI images personally reviewed.  _________________________________   HISTORICAL  CHIEF COMPLAINT:  Chief Complaint  Patient presents with   RM11/MS    Pt is here Alone. Pt states everything has been going good since his last appointment.      HISTORY OF PRESENT ILLNESS:  Cody Rodriguez is a 38 y.o. man with  relapsing remitting multiple sclerosis.  Update 03/31/2024: He started Ocrevus (last infusion July/2024).  Next infusion is in July.  He tolerates it well and has no exacerbation.    No issues with side effects.   However, he feels worse the last month of each cycle and sometimes stumbles more and feels more tired that month.  Currently, gait is doing well.  He can go up and down stairs well.  He has no difficulty with ladders  He has stumbled some but no falls.  He still has some tingling and numbness in the left leg.    He denies any diplopia or other visual symptoms   He denies any problems with bladder function.      He notes that the fatigue improved and is worse the last month of each cycle.     He is sleeping well.  He denies issues with mood or cognition.      He works for a [employer] outdoors mostly    He was taking Vit D supplements.  He is Rebif to get back on the weekly supplement  He is going to discuss weight loss drugs (255 pounds) with his PCP next time he sees them.  MS History: He presented to [facility] emergency room 07/20/2020 with reduced balance, poor gait, headache, blurry vision, slurred speech and weakness for 2 days.   The left leg was more clumsy and weak than the right. He needed a walker.   Arms were strong.    He had diplopia and his fiance noted his eyes were not conjugate.   He had no nystagmus.   Monocular vision was fine.  MRI of the  brain showed changes consistent with MS and he was admitted and received 5 days of IV Solu-Medrol .  In retrospect, he had noted several episodes of poor balance, dizziness or weakness but he would improve after a few days to a week or two so did not seek medical help over the past 3 years.   He had an episode with abdominal numbness x 3-4 weeks about 2-3 years ago.   He had some balance issues and also felt weakness in the arms and legs that persisted.   He had not had vision problems until the past couple weeks.    He has no family history of MS.  07/12/2020 labs:  Vitamin D  was 11.6 , mild hyperlipidemia.  11/25/2019 he had rhabdomyolysis after exercising while dehydrated.     IMAGING MRI of the brain 07/20/2020 shows multiple T2/FLAIR hyperintense foci in the hemispheres including some in the periventricular white matter, radially oriented to the ventricles.  There is a large focus in the right corona radiata and centrum semiovale with restricted diffusion and some rim enhancement  MRI of the cervical spine 07/21/2020 shows T2 hyperintense foci at C2-C3, C4, C5 and C6-C7.  There is some enhancement in the anterior spinal cord at C3 consistent with an acute focus.  MRI of the thoracic  spine 07/21/2020 shows T2 hyperintense foci centrally at T4 and T9-T10  MRI of the lumbar spine 05/12/2020 shows facet hypertrophy and ligamenta flava hypertrophy at L4-L5 with some encroachment upon the left L5 nerve root in the lateral recess.  MRI brain 12/31/2022 showed Multiple T2/FLAIR hyperintense foci in the hemispheres, predominantly in the periventricular white matter with subtle foci in the right middle cerebellar peduncle and upper cervical spinal cord. None of the foci appear to be acute. There are no new lesions compared to 07/03/2021 MRI.   MRI cervical spine 12/31/2022 T2 hyperintense foci within the spinal cord centrally adjacent to C2, adjacent to C2-C3 (On sagittal images not seen on axial views),  posteriorly adjacent to C4, anteriorly to the left adjacent to C5. None of these appear to be acute. There are no new lesions compared to the 07/21/2020 MRI   REVIEW OF SYSTEMS: Constitutional: No fevers, chills, sweats, or change in appetite Eyes: No visual changes, double vision, eye pain Ear, nose and throat: No hearing loss, ear pain, nasal congestion, sore throat Cardiovascular: No chest pain, palpitations Respiratory:  No shortness of breath at rest or with exertion.   No wheezes GastrointestinaI: No nausea, vomiting, diarrhea, abdominal pain, fecal incontinence Genitourinary:  No dysuria, urinary retention or frequency.  No nocturia. Musculoskeletal:  No neck pain, back pain Integumentary: No rash, pruritus, skin lesions Neurological: as above Psychiatric: No depression at this time.  No anxiety Endocrine: No palpitations, diaphoresis, change in appetite, change in weigh or increased thirst Hematologic/Lymphatic:  No anemia, purpura, petechiae. Allergic/Immunologic: No itchy/runny eyes, nasal congestion, recent allergic reactions, rashes  ALLERGIES: No Known Allergies  HOME MEDICATIONS:  Current Outpatient Medications:    amLODipine  (NORVASC ) 5 MG tablet, Take 1 tablet (5 mg total) by mouth daily., Disp: 90 tablet, Rfl: 3   ocrelizumab (OCREVUS) 300 MG/10ML injection, Inject into the vein once. Initial dose: 300mg  IV on day 1 and day 15 Subsequent dose: 600mg  IV q 6 months, Disp: , Rfl:    acetaminophen  (TYLENOL ) 325 MG tablet, Take 650 mg by mouth every 6 (six) hours as needed for mild pain, fever or headache. (Patient not taking: Reported on 03/31/2024), Disp: , Rfl:    lisinopril (ZESTRIL) 10 MG tablet, Take 10 mg by mouth in the morning. (Patient not taking: Reported on 03/31/2024), Disp: , Rfl:    predniSONE  (DELTASONE ) 20 MG tablet, Take 3 pills (60 mg) po the day before your infusions (Patient not taking: Reported on 03/31/2024), Disp: 9 tablet, Rfl: 1   Vitamin D ,  Ergocalciferol , (DRISDOL ) 1.25 MG (50000 UNIT) CAPS capsule, Take 1 capsule (50,000 Units total) by mouth every 7 (seven) days., Disp: 13 capsule, Rfl: 3  PAST MEDICAL HISTORY: Past Medical History:  Diagnosis Date   Gynecomastia, male 07/02/2014   High blood pressure    Low back pain    Multiple sclerosis 07/21/2020   Paresthesia and pain of both upper extremities    also in right leg   Rhabdomyolysis 11/24/2019   Swelling     PAST SURGICAL HISTORY: Past Surgical History:  Procedure Laterality Date   FINGER SURGERY Left    left pinky   ROTATOR CUFF REPAIR Left     FAMILY HISTORY: Family History  Problem Relation Age of Onset   Healthy Mother    Diabetes Mother    Hypertension Mother    Hyperlipidemia Mother    Sleep apnea Mother    Hypertension Father    Hyperlipidemia Father    Healthy Sister  Healthy Brother    Diabetes Maternal Aunt    Neuropathy Neg Hx    Multiple sclerosis Neg Hx     SOCIAL HISTORY:  Social History   Socioeconomic History   Marital status: Single    Spouse name: Not on file   Number of children: 0   Years of education: 12   Highest education level: Not on file  Occupational History   Occupation: Clinical Research Associate: [employer]  Tobacco Use   Smoking status: Former    Current packs/day: 0.50    Average packs/day: 0.5 packs/day for 10.0 years (5.0 ttl pk-yrs)    Types: Cigarettes   Smokeless tobacco: Never   Tobacco comments:    10/25/15 cut back a little  Substance and Sexual Activity   Alcohol use: Not Currently    Alcohol/week: 0.0 standard drinks of alcohol   Drug use: No   Sexual activity: Not on file  Other Topics Concern   Not on file  Social History Narrative    Right handed   Caffeine use: sometimes   He also eats chocolate occasionally.   Social Drivers of Health   Tobacco Use: Medium Risk (03/31/2024)   Patient History    Smoking Tobacco Use: Former    Smokeless Tobacco Use: Never    Passive Exposure: Not  on [place] Strain: Not on file  Food Insecurity: Not on file  Transportation Needs: Not on file  Physical Activity: Not on file  Stress: Not on file  Social Connections: Not on file  Intimate Partner Violence: Not on file  Depression (EYV7-0): Not on file  Alcohol Screen: Not on file  Housing: Not on file  Utilities: Not on file  Health Literacy: Not on file     PHYSICAL EXAM  Vitals:   03/31/24 1333  BP: (!) 159/94  Pulse: 80  SpO2: 98%  Weight: 255 lb 8 oz (115.9 kg)  Height: 5' 10 (1.778 m)     Body mass index is 36.66 kg/m.   General: The patient is well-developed and well-nourished and in no acute distress  HEENT:  Head is [place]/AT.  Sclera are anicteric.   Skin: Extremities are without rash or  edema.   Callous left plantar foot near 3rd toe   Neurologic Exam  Mental status: The patient is alert and oriented x 3 at the time of the examination. The patient has apparent normal recent and remote memory, with an apparently normal attention span and concentration ability.   Speech is normal.  Cranial nerves: Extraocular movements are full. Facial strength and sensation are normal. No obvious hearing deficits are noted.  Motor:  Muscle bulk is normal.   Tone is normal. Strength is  5 / 5 now including left foot. SABRA RAM now symmetric.    Sensory: Sensory testing is intact to  touch and vibration sensation in all 4 extremities.  Coordination: Cerebellar testing reveals normal finger-nose-finger and heel-to-shin .  Gait and station: Station is normal.   Gait is normal.  Tandem gait is mildly wide.   Romberg is now negative.   Reflexes: Deep tendon reflexes are symmetric and increased in left knee relative to right.       DIAGNOSTIC DATA (LABS, IMAGING, TESTING) - I reviewed patient records, labs, notes, testing and imaging myself where available.  Lab Results  Component Value Date   WBC 6.6 04/01/2024   HGB 14.5 04/01/2024   HCT 44.9  04/01/2024   MCV 87 04/01/2024  PLT 371 04/01/2024      Component Value Date/Time   NA 138 12/05/2020 1508   K 4.1 12/05/2020 1508   CL 101 12/05/2020 1508   CO2 21 12/05/2020 1508   GLUCOSE 74 12/05/2020 1508   GLUCOSE 158 (H) 07/24/2020 1023   BUN 12 12/05/2020 1508   CREATININE 1.08 12/05/2020 1508   CREATININE 1.16 06/26/2014 1402   CALCIUM 9.3 12/05/2020 1508   PROT 7.2 12/05/2020 1508   ALBUMIN 4.3 12/05/2020 1508   AST 17 12/05/2020 1508   ALT 19 12/05/2020 1508   ALKPHOS 75 12/05/2020 1508   BILITOT 0.5 12/05/2020 1508   GFRNONAA >60 07/24/2020 1023   GFRNONAA 85 06/26/2014 1402   GFRAA 93 07/12/2020 1220   GFRAA >89 06/26/2014 1402   Lab Results  Component Value Date   CHOL 184 12/05/2020   HDL 39 (L) 12/05/2020   LDLCALC 129 (H) 12/05/2020   TRIG 85 12/05/2020   Lab Results  Component Value Date   HGBA1C 5.6 12/05/2020   Lab Results  Component Value Date   VITAMINB12 1,217 07/12/2020   Lab Results  Component Value Date   TSH 1.410 07/12/2020       ASSESSMENT AND PLAN  Multiple sclerosis, relapsing-remitting  High risk medication use - Plan: CBC with Differential/Platelet, CD20 B Cells, Hep B Surface Antigen, Hepatitis B Core AB, Total, IgG, IgA, IgM, IgG, IgA, IgM, Hepatitis B Core AB, Total, Hep B Surface Antigen, CD20 B Cells, CBC with Differential/Platelet, CANCELED: IgG, IgA, IgM, CANCELED: CD20 B Cells, CANCELED: CBC with Differential/Platelet, CANCELED: Hepatitis B Core AB, Total, CANCELED: Hep B Surface Antigen  Multiple sclerosis - Plan: CBC with Differential/Platelet, CD20 B Cells, Hep B Surface Antigen, Hepatitis B Core AB, Total, IgG, IgA, IgM, IgG, IgA, IgM, Hepatitis B Core AB, Total, Hep B Surface Antigen, CD20 B Cells, CBC with Differential/Platelet, CANCELED: IgG, IgA, IgM, CANCELED: CD20 B Cells, CANCELED: CBC with Differential/Platelet, CANCELED: Hepatitis B Core AB, Total, CANCELED: Hep B Surface Antigen  Left-sided  weakness  Vitamin D  deficiency    1.   He will switch to Briumvi from Ocrevus due to a wearing off effect on Ocrevus the last month of each cycle with increased stumbling and fatigue..   Check IgG/IgM and CBC with differential and CD20/19   2.   Stay active.  Exercise as tolerated 3.    Take  vitamin D  supplement  4.    Return in 6 months or sooner if there are new or worsening neurologic symptoms  Rinda Rollyson A. Vear, MD, [facility] 10/20/2024, 4:30 PM Certified in Neurology, Clinical Neurophysiology, Sleep Medicine and Neuroimaging   Addendum 10/18/2024: The patient reports that he is doing much better on the Briumvi than he did on Ocrevus.  Specifically, gait has improved and he is not having the wearing off effect causing weakness and fatigue --  Leigha Olberding A. Vear, MD, PhD, [facility] [address] [place], [place] [address] [phone] "

## 2024-03-31 NOTE — Telephone Encounter (Signed)
 Khadijah called pt. He decided to take appt today at 1:30pm with Dr. Vear. She scheduled pt.

## 2024-03-31 NOTE — Telephone Encounter (Signed)
 Per Briant, pt cannot take appt today at 130pm with Dr. Vear. At work.  Spoke w/ Dr. Vear. Ok to offer appt 04/06/24 at 9am with Dr. Vear

## 2024-03-31 NOTE — Telephone Encounter (Signed)
 Pt called in regards to having blood work done the patient states he was told to get blood work done but orders was not sent over . Pt has to be back at work and wants to know when will he be able get his blood work done he will like callback

## 2024-03-31 NOTE — Telephone Encounter (Signed)
 Received message from [facility]:   I Submitted for a new PA and They are requesting an updated OVN.SABRA Its been over 6 months from his las Visit and has non scheduled. He also needs a new written order as his is shy of expiring. Thanks!  Called pt at [phone]. LVM for him to call.

## 2024-03-31 NOTE — Telephone Encounter (Signed)
 Labs pending to be faxed to [facility].

## 2024-04-01 ENCOUNTER — Other Ambulatory Visit: Payer: Self-pay | Admitting: Neurology

## 2024-04-04 ENCOUNTER — Ambulatory Visit: Payer: Self-pay | Admitting: Neurology

## 2024-04-06 ENCOUNTER — Telehealth: Payer: Self-pay

## 2024-04-06 LAB — CBC WITH DIFFERENTIAL/PLATELET
Basophils Absolute: 0.1 10*3/uL (ref 0.0–0.2)
Basos: 1 %
EOS (ABSOLUTE): 0.2 10*3/uL (ref 0.0–0.4)
Eos: 2 %
Hematocrit: 44.9 % (ref 37.5–51.0)
Hemoglobin: 14.5 g/dL (ref 13.0–17.7)
Immature Grans (Abs): 0 10*3/uL (ref 0.0–0.1)
Immature Granulocytes: 0 %
Lymphocytes Absolute: 2.1 10*3/uL (ref 0.7–3.1)
Lymphs: 31 %
MCH: 27.9 pg (ref 26.6–33.0)
MCHC: 32.3 g/dL (ref 31.5–35.7)
MCV: 87 fL (ref 79–97)
Monocytes Absolute: 0.8 10*3/uL (ref 0.1–0.9)
Monocytes: 11 %
Neutrophils Absolute: 3.6 10*3/uL (ref 1.4–7.0)
Neutrophils: 55 %
Platelets: 371 10*3/uL (ref 150–450)
RBC: 5.19 x10E6/uL (ref 4.14–5.80)
RDW: 15.9 % — ABNORMAL HIGH (ref 11.6–15.4)
WBC: 6.6 10*3/uL (ref 3.4–10.8)

## 2024-04-06 LAB — IGG, IGA, IGM
IgA/Immunoglobulin A, Serum: 201 mg/dL (ref 90–386)
IgG (Immunoglobin G), Serum: 916 mg/dL (ref 603–1613)
IgM (Immunoglobulin M), Srm: 13 mg/dL — ABNORMAL LOW (ref 20–172)

## 2024-04-06 LAB — HEPATITIS B SURFACE ANTIGEN: Hepatitis B Surface Ag: NEGATIVE

## 2024-04-06 LAB — CD20 B CELLS
% CD19-B Cells: 1.6 — AB (ref 4.6–22.1)
% CD20-B Cells: 1.6 — AB (ref 5.0–22.3)

## 2024-04-06 LAB — HEPATITIS B CORE ANTIBODY, TOTAL: Hep B Core Total Ab: NEGATIVE

## 2024-04-06 NOTE — Telephone Encounter (Signed)
 Pt called in asking if he was approved for Briumvi, sent message to Caroline with [employer]. Will call pt back in the morning soon as we hear something back from [place].

## 2024-04-07 NOTE — Telephone Encounter (Signed)
 Called pt at [phone]. Per Dr. Vear, labs ok to proceed with changing therapy to Briumvi. I relayed to pt. He verbalized understanding. Aware we will give orders to [facility] to start working on getting approval. Once approved, they will call to get him scheduled.  He will also be on look out for calls from [facility] pt support.  Faxed completed/signed Briumvi start form to Briumvi Patient Support at [phone]. Received fax confirmation.  Gave signed order/notes to [facility] to start approval process.

## 2024-04-25 NOTE — Telephone Encounter (Signed)
 I have reached out to the infusion suite for an update on this case.

## 2024-04-26 NOTE — Telephone Encounter (Signed)
 Per [facility] in the infusion suite, patient is scheduled 7/23 and 8/6

## 2024-09-27 DIAGNOSIS — M25561 Pain in right knee: Secondary | ICD-10-CM | POA: Insufficient documentation

## 2024-10-18 ENCOUNTER — Telehealth: Payer: Self-pay | Admitting: *Deleted

## 2024-10-18 NOTE — Telephone Encounter (Signed)
 Patient returned my call. He states he feels a lot better on the Briumvi. He does not stumble as much is not as tired as he was when at the end of an Ocrevus infusion round.   Dr Vear will you make an addendum asap to your last OV noting response to Briumvi? This is for his 10/20/24 infusion.   Pt has been scheduled for a routine f/u on 11/02/24 at 9 am arrive [phone].

## 2024-10-18 NOTE — Telephone Encounter (Signed)
 Pt has called RN back

## 2024-10-18 NOTE — Telephone Encounter (Signed)
 Mliss w/ [facility] notified of addendum made indicating positive response to Briumvi.

## 2024-10-18 NOTE — Telephone Encounter (Signed)
 Received message from Mliss w/ [employer] stating insurance needs update (ok to addend last note) before pt's next Briumvi infusion on Thurs 1/15. Pt is also due for a follow-up anyway. I called the pt and LVM (ok per DPR) asking for call back asap to let us  know how he's doing on Briumvi as this is required by insurance. Also informed pt that I need to schedule his f/u. He is supposed to be seen every 6 months on these medications. Left office number in message.   When he calls back, please see if I'm available to take the call.

## 2024-10-20 ENCOUNTER — Encounter: Payer: Self-pay | Admitting: Neurology

## 2024-10-20 NOTE — Telephone Encounter (Signed)
 Briumvi fax updated with new dx code:

## 2024-10-20 NOTE — Telephone Encounter (Signed)
 Received fax from [facility] requesting updated MS Dx code from Dr Vear.

## 2024-11-02 ENCOUNTER — Encounter: Payer: Self-pay | Admitting: Neurology

## 2024-11-02 ENCOUNTER — Ambulatory Visit (INDEPENDENT_AMBULATORY_CARE_PROVIDER_SITE_OTHER): Admitting: Neurology

## 2024-11-02 VITALS — BP 159/110 | HR 79 | Ht 70.0 in | Wt 251.5 lb

## 2024-11-02 DIAGNOSIS — E559 Vitamin D deficiency, unspecified: Secondary | ICD-10-CM | POA: Diagnosis not present

## 2024-11-02 DIAGNOSIS — R270 Ataxia, unspecified: Secondary | ICD-10-CM

## 2024-11-02 DIAGNOSIS — I10 Essential (primary) hypertension: Secondary | ICD-10-CM | POA: Diagnosis not present

## 2024-11-02 DIAGNOSIS — G35A Relapsing-remitting multiple sclerosis: Secondary | ICD-10-CM | POA: Diagnosis not present

## 2024-11-02 DIAGNOSIS — Z79899 Other long term (current) drug therapy: Secondary | ICD-10-CM

## 2024-11-02 DIAGNOSIS — R531 Weakness: Secondary | ICD-10-CM

## 2024-11-02 NOTE — Progress Notes (Signed)
 "  [facility]  PATIENT: Cody Rodriguez DOB: 03-Mar-1986  REFERRING DOCTOR OR PCP:  Jaynie Daniels, DO SOURCE: Patient, notes from recent hospital admission, imaging and laboratory reports, MRI images personally reviewed.  _________________________________   HISTORICAL  CHIEF COMPLAINT:  Chief Complaint  Patient presents with   RM10/MS    Pt is here Alone. Pt states that he has been doing well, no complaints.      HISTORY OF PRESENT ILLNESS:  Cody Rodriguez is a 39 y.o. man with  relapsing remitting multiple sclerosis.  Update 11/02/2024: He switched to Briumvi and felt he got a benefit the entire 6 months.   He started Ocrevus (last infusion July/2024). He had no exacerbation.    No issues with side effects during the infusion.   However, he felt worse the last month of each  Ocrevus cycle and was stumbling more and feels more tired that month.  Currently, gait is doing well.  He can go up and down stairs well. He does hold the rail for safety.   He has no difficulty with ladders  He has stumbled some but no falls.  He reports mild tingling and numbness in the left leg - not too intense and bearable.    He denies any diplopia or other visual symptoms   He notes some urinary urgency.   Denies hesitancy but has some double voids due to incomplete emptying     He notes that the fatigue improved and is worse the last month of each cycle.     He is sleeping well.  He denies issues with mood or cognition.      He works for a [employer] outdoors mostly     He may need right knee surgery (suspected meniscus tear and will be doing an MRI  Injections helped transiently.   He was taking Vit D supplements.  He is Rebif to get back on the weekly supplement  He is going to discuss weight loss drugs (255 pounds) with his PCP next time he sees them.  MS History: He presented to [facility] emergency room 07/20/2020 with reduced balance, poor gait, headache, blurry vision,  slurred speech and weakness for 2 days.   The left leg was more clumsy and weak than the right. He needed a walker.   Arms were strong.    He had diplopia and his fiance noted his eyes were not conjugate.   He had no nystagmus.   Monocular vision was fine.  MRI of the brain showed changes consistent with MS and he was admitted and received 5 days of IV Solu-Medrol .  In retrospect, he had noted several episodes of poor balance, dizziness or weakness but he would improve after a few days to a week or two so did not seek medical help over the past 3 years.   He had an episode with abdominal numbness x 3-4 weeks about 2-3 years ago.   He had some balance issues and also felt weakness in the arms and legs that persisted.   He had not had vision problems until the past couple weeks.    He has no family history of MS.  07/12/2020 labs:  Vitamin D  was 11.6 , mild hyperlipidemia.  11/25/2019 he had rhabdomyolysis after exercising while dehydrated.     IMAGING MRI of the brain 07/20/2020 shows multiple T2/FLAIR hyperintense foci in the hemispheres including some in the periventricular white matter, radially oriented to the ventricles.  There is a large focus  in the right corona radiata and centrum semiovale with restricted diffusion and some rim enhancement  MRI of the cervical spine 07/21/2020 shows T2 hyperintense foci at C2-C3, C4, C5 and C6-C7.  There is some enhancement in the anterior spinal cord at C3 consistent with an acute focus.  MRI of the thoracic spine 07/21/2020 shows T2 hyperintense foci centrally at T4 and T9-T10  MRI of the lumbar spine 05/12/2020 shows facet hypertrophy and ligamenta flava hypertrophy at L4-L5 with some encroachment upon the left L5 nerve root in the lateral recess.  MRI brain 12/31/2022 showed Multiple T2/FLAIR hyperintense foci in the hemispheres, predominantly in the periventricular white matter with subtle foci in the right middle cerebellar peduncle and upper cervical  spinal cord. None of the foci appear to be acute. There are no new lesions compared to 07/03/2021 MRI.   MRI cervical spine 12/31/2022 T2 hyperintense foci within the spinal cord centrally adjacent to C2, adjacent to C2-C3 (On sagittal images not seen on axial views), posteriorly adjacent to C4, anteriorly to the left adjacent to C5. None of these appear to be acute. There are no new lesions compared to the 07/21/2020 MRI   REVIEW OF SYSTEMS: Constitutional: No fevers, chills, sweats, or change in appetite Eyes: No visual changes, double vision, eye pain Ear, nose and throat: No hearing loss, ear pain, nasal congestion, sore throat Cardiovascular: No chest pain, palpitations Respiratory:  No shortness of breath at rest or with exertion.   No wheezes GastrointestinaI: No nausea, vomiting, diarrhea, abdominal pain, fecal incontinence Genitourinary:  No dysuria, urinary retention or frequency.  No nocturia. Musculoskeletal:  No neck pain, back pain Integumentary: No rash, pruritus, skin lesions Neurological: as above Psychiatric: No depression at this time.  No anxiety Endocrine: No palpitations, diaphoresis, change in appetite, change in weigh or increased thirst Hematologic/Lymphatic:  No anemia, purpura, petechiae. Allergic/Immunologic: No itchy/runny eyes, nasal congestion, recent allergic reactions, rashes  ALLERGIES: No Known Allergies  HOME MEDICATIONS:  Current Outpatient Medications:    amLODipine  (NORVASC ) 5 MG tablet, Take 1 tablet (5 mg total) by mouth daily., Disp: 90 tablet, Rfl: 3   lisinopril (ZESTRIL) 10 MG tablet, Take 10 mg by mouth in the morning., Disp: , Rfl:    ublituximab-xiiy (BRIUMVI) 150 MG/6ML SOLN injection, as directed Intravenous, Disp: , Rfl:    Vitamin D , Ergocalciferol , (DRISDOL ) 1.25 MG (50000 UNIT) CAPS capsule, Take 1 capsule (50,000 Units total) by mouth every 7 (seven) days., Disp: 13 capsule, Rfl: 3   acetaminophen  (TYLENOL ) 325 MG tablet, Take 650 mg  by mouth every 6 (six) hours as needed for mild pain, fever or headache. (Patient not taking: Reported on 11/02/2024), Disp: , Rfl:    ocrelizumab (OCREVUS) 300 MG/10ML injection, Inject into the vein once. Initial dose: 300mg  IV on day 1 and day 15 Subsequent dose: 600mg  IV q 6 months (Patient not taking: Reported on 11/02/2024), Disp: , Rfl:    predniSONE  (DELTASONE ) 20 MG tablet, Take 3 pills (60 mg) po the day before your infusions (Patient not taking: Reported on 11/02/2024), Disp: 9 tablet, Rfl: 1  PAST MEDICAL HISTORY: Past Medical History:  Diagnosis Date   Gynecomastia, male 07/02/2014   High blood pressure    Low back pain    Multiple sclerosis 07/21/2020   Paresthesia and pain of both upper extremities    also in right leg   Rhabdomyolysis 11/24/2019   Swelling     PAST SURGICAL HISTORY: Past Surgical History:  Procedure Laterality Date  FINGER SURGERY Left    left pinky   ROTATOR CUFF REPAIR Left     FAMILY HISTORY: Family History  Problem Relation Age of Onset   Healthy Mother    Diabetes Mother    Hypertension Mother    Hyperlipidemia Mother    Sleep apnea Mother    Hypertension Father    Hyperlipidemia Father    Healthy Sister    Healthy Brother    Diabetes Maternal Aunt    Neuropathy Neg Hx    Multiple sclerosis Neg Hx     SOCIAL HISTORY:  Social History   Socioeconomic History   Marital status: Single    Spouse name: Not on file   Number of children: 0   Years of education: 12   Highest education level: Not on file  Occupational History   Occupation: Clinical Research Associate: [employer]  Tobacco Use   Smoking status: Former    Current packs/day: 0.50    Average packs/day: 0.5 packs/day for 10.0 years (5.0 ttl pk-yrs)    Types: Cigarettes   Smokeless tobacco: Never   Tobacco comments:    10/25/15 cut back a little  Substance and Sexual Activity   Alcohol use: Not Currently    Alcohol/week: 0.0 standard drinks of alcohol   Drug use: No    Sexual activity: Not on file  Other Topics Concern   Not on file  Social History Narrative    Right handed   Caffeine use: sometimes   He also eats chocolate occasionally.   Social Drivers of Health   Tobacco Use: Medium Risk (11/02/2024)   Patient History    Smoking Tobacco Use: Former    Smokeless Tobacco Use: Never    Passive Exposure: Not on Actuary Strain: Not on file  Food Insecurity: Not on file  Transportation Needs: Not on file  Physical Activity: Not on file  Stress: Not on file  Social Connections: Not on file  Intimate Partner Violence: Not on file  Depression (EYV7-0): Not on file  Alcohol Screen: Not on file  Housing: Not on file  Utilities: Not on file  Health Literacy: Not on file     PHYSICAL EXAM  Vitals:   11/02/24 0838  Weight: 251 lb 8 oz (114.1 kg)  Height: 5' 10 (1.778 m)     Body mass index is 36.09 kg/m.   General: The patient is well-developed and well-nourished and in no acute distress  HEENT:  Head is [place]/AT.  Sclera are anicteric.   Skin: Extremities are without rash or  edema.   Callous left plantar foot near 3rd toe   Neurologic Exam  Mental status: The patient is alert and oriented x 3 at the time of the examination. The patient has apparent normal recent and remote memory, with an apparently normal attention span and concentration ability.   Speech is normal.  Cranial nerves: Extraocular movements are full. Facial strength and sensation are normal. No obvious hearing deficits are noted.  Motor:  Muscle bulk is normal.   Tone is normal. Strength is  5 / 5 now including left foot. SABRA RAM now symmetric.    Sensory: Sensory testing is intact to  touch and vibration sensation in all 4 extremities.  Coordination: Cerebellar testing reveals normal finger-nose-finger and heel-to-shin .  Gait and station: Station is normal.   Gait is normal.  Tandem gait is mildly wide.   Romberg is now negative.   Reflexes: Deep  tendon reflexes  are symmetric and increased in left knee relative to right.       DIAGNOSTIC DATA (LABS, IMAGING, TESTING) - I reviewed patient records, labs, notes, testing and imaging myself where available.  Lab Results  Component Value Date   WBC 6.6 04/01/2024   HGB 14.5 04/01/2024   HCT 44.9 04/01/2024   MCV 87 04/01/2024   PLT 371 04/01/2024      Component Value Date/Time   NA 138 12/05/2020 1508   K 4.1 12/05/2020 1508   CL 101 12/05/2020 1508   CO2 21 12/05/2020 1508   GLUCOSE 74 12/05/2020 1508   GLUCOSE 158 (H) 07/24/2020 1023   BUN 12 12/05/2020 1508   CREATININE 1.08 12/05/2020 1508   CREATININE 1.16 06/26/2014 1402   CALCIUM 9.3 12/05/2020 1508   PROT 7.2 12/05/2020 1508   ALBUMIN 4.3 12/05/2020 1508   AST 17 12/05/2020 1508   ALT 19 12/05/2020 1508   ALKPHOS 75 12/05/2020 1508   BILITOT 0.5 12/05/2020 1508   GFRNONAA >60 07/24/2020 1023   GFRNONAA 85 06/26/2014 1402   GFRAA 93 07/12/2020 1220   GFRAA >89 06/26/2014 1402   Lab Results  Component Value Date   CHOL 184 12/05/2020   HDL 39 (L) 12/05/2020   LDLCALC 129 (H) 12/05/2020   TRIG 85 12/05/2020   Lab Results  Component Value Date   HGBA1C 5.6 12/05/2020   Lab Results  Component Value Date   VITAMINB12 1,217 07/12/2020   Lab Results  Component Value Date   TSH 1.410 07/12/2020       ASSESSMENT AND PLAN  Multiple sclerosis, relapsing-remitting  High risk medication use  Left-sided weakness  Vitamin D  deficiency  Ataxia  Essential hypertension    1.   Continue Briumvi Check IgG/IgM and CBC with differential.   Check MRi brain and cervical spine.  If breakthrough activity occurring, consider a different DMT.  2.   Stay active.  Exercise as tolerated 3.    Take  vitamin D  supplement  4.    Return in 6 months or sooner if there are new or worsening neurologic symptoms  Cottrell Gentles A. Vear, MD, [facility] 11/02/2024, 8:40 AM Certified in Neurology, Clinical Neurophysiology,  Sleep Medicine and Neuroimaging   Addendum 10/18/2024: The patient reports that he is doing much better on the Briumvi than he did on Ocrevus.  Specifically, gait has improved and he is not having the wearing off effect causing weakness and fatigue --  Zanai Mallari A. Vear, MD, PhD, [facility] [address], [address] [place], [place] [address] [phone] "

## 2024-11-03 ENCOUNTER — Ambulatory Visit: Payer: Self-pay | Admitting: Neurology

## 2024-11-03 LAB — IGG, IGA, IGM
IgG (Immunoglobin G), Serum: 810 mg/dL (ref 603–1613)
IgM (Immunoglobulin M), Srm: 12 mg/dL — ABNORMAL LOW (ref 20–172)
Immunoglobulin A, (IgA) QN, Serum: 196 mg/dL (ref 90–386)

## 2024-11-03 LAB — CBC WITH DIFFERENTIAL/PLATELET
Basophils Absolute: 0.1 10*3/uL (ref 0.0–0.2)
Basos: 1 %
EOS (ABSOLUTE): 0.1 10*3/uL (ref 0.0–0.4)
Eos: 1 %
Hematocrit: 45 % (ref 37.5–51.0)
Hemoglobin: 14.7 g/dL (ref 13.0–17.7)
Immature Grans (Abs): 0 10*3/uL (ref 0.0–0.1)
Immature Granulocytes: 0 %
Lymphocytes Absolute: 1.6 10*3/uL (ref 0.7–3.1)
Lymphs: 24 %
MCH: 28.1 pg (ref 26.6–33.0)
MCHC: 32.7 g/dL (ref 31.5–35.7)
MCV: 86 fL (ref 79–97)
Monocytes Absolute: 0.7 10*3/uL (ref 0.1–0.9)
Monocytes: 11 %
Neutrophils Absolute: 4.1 10*3/uL (ref 1.4–7.0)
Neutrophils: 63 %
Platelets: 401 10*3/uL (ref 150–450)
RBC: 5.23 x10E6/uL (ref 4.14–5.80)
RDW: 15.5 % — ABNORMAL HIGH (ref 11.6–15.4)
WBC: 6.5 10*3/uL (ref 3.4–10.8)

## 2024-11-09 ENCOUNTER — Ambulatory Visit

## 2024-11-09 DIAGNOSIS — R531 Weakness: Secondary | ICD-10-CM

## 2024-11-09 DIAGNOSIS — G35A Relapsing-remitting multiple sclerosis: Secondary | ICD-10-CM | POA: Diagnosis not present
